# Patient Record
Sex: Female | Born: 1990 | Hispanic: Yes | Marital: Single | Smoking: Never smoker
Health system: Southern US, Community
[De-identification: ages and names within clinical notes are randomized; demographics above are authoritative.]

## PROBLEM LIST (undated history)

## (undated) ENCOUNTER — Ambulatory Visit: Payer: Self-pay

## (undated) ENCOUNTER — Inpatient Hospital Stay (HOSPITAL_COMMUNITY): Payer: Self-pay

## (undated) DIAGNOSIS — O24419 Gestational diabetes mellitus in pregnancy, unspecified control: Secondary | ICD-10-CM

## (undated) DIAGNOSIS — Z8619 Personal history of other infectious and parasitic diseases: Secondary | ICD-10-CM

## (undated) HISTORY — PX: NO PAST SURGERIES: SHX2092

## (undated) HISTORY — PX: APPENDECTOMY: SHX54

## (undated) HISTORY — PX: HERNIA REPAIR: SHX51

---

## 2014-11-22 NOTE — L&D Delivery Note (Signed)
Delivery Note  Patient was admitted after PROM at 0630 on 09/28/15 in early labor. Labor was augmented with pitocin, and patient progressed to completion with a lip at 1850. Labor course significant for prolonged second stage. Patient felt the urge to push before 2000. However, she was still between a -2 and -1 station. She received an epidural around 2200 and labored down before pushing again.   At 1:01 AM a viable and healthy female was delivered via Vaginal, Spontaneous Delivery (Presentation: Right Occiput Anterior) with compound hand.  APGAR: 9, 9; weight pending.   Placenta status: Intact, Spontaneous.  Cord:  with the following complications: arm cord.  Cord pH: N/A  Anesthesia: Epidural  Episiotomy: None Lacerations: 1st degree, perineal Suture Repair: 2.0 vicryl Est. Blood Loss (mL): 400  Mom to postpartum.  Baby to Couplet care / Skin to Skin.  Jamelle HaringHillary M Fitzgerald, MD [facility], PGY-1 09/29/2015, 1:35 AM

## 2015-03-12 ENCOUNTER — Ambulatory Visit (INDEPENDENT_AMBULATORY_CARE_PROVIDER_SITE_OTHER): Payer: Self-pay | Admitting: Obstetrics and Gynecology

## 2015-03-12 ENCOUNTER — Encounter: Payer: Self-pay | Admitting: Obstetrics and Gynecology

## 2015-03-12 VITALS — BP 99/64 | HR 89 | Ht 61.75 in | Wt 116.8 lb

## 2015-03-12 DIAGNOSIS — Z124 Encounter for screening for malignant neoplasm of cervix: Secondary | ICD-10-CM

## 2015-03-12 DIAGNOSIS — O0990 Supervision of high risk pregnancy, unspecified, unspecified trimester: Secondary | ICD-10-CM | POA: Insufficient documentation

## 2015-03-12 DIAGNOSIS — Z789 Other specified health status: Secondary | ICD-10-CM

## 2015-03-12 DIAGNOSIS — O209 Hemorrhage in early pregnancy, unspecified: Secondary | ICD-10-CM

## 2015-03-12 DIAGNOSIS — Z3401 Encounter for supervision of normal first pregnancy, first trimester: Secondary | ICD-10-CM

## 2015-03-12 DIAGNOSIS — Z113 Encounter for screening for infections with a predominantly sexual mode of transmission: Secondary | ICD-10-CM

## 2015-03-12 DIAGNOSIS — Z1151 Encounter for screening for human papillomavirus (HPV): Secondary | ICD-10-CM

## 2015-03-12 DIAGNOSIS — Z3491 Encounter for supervision of normal pregnancy, unspecified, first trimester: Secondary | ICD-10-CM

## 2015-03-12 NOTE — Progress Notes (Signed)
   Subjective:    Alexandra Hardy is a G1P0 [id number] being seen today for her first obstetrical visit.  Her obstetrical history is significant for G1. Patient does intend to breast feed. Pregnancy history fully reviewed.  Patient reports nausea and brown vaginal discharge x 1 wk. Denies antecedent intercourse or irritative vaginitis. Has lower abdominal cramping intermittently and urinary fequency. Denies dysuria, urgency.  Ceasar Mons.  Filed Vitals:   03/12/15 1325 03/12/15 1327  BP: 99/64   Pulse: 89   Height:  5' 1.75" (1.568 m)  Weight: 116 lb 12.8 oz (52.98 kg)     HISTORY: OB History  Gravida Para Term Preterm AB SAB TAB Ectopic Multiple Living  1             # Outcome Date GA Lbr Len/2nd Weight Sex Delivery Anes PTL Lv  1 Current              History reviewed. No pertinent past medical history. History reviewed. No pertinent past surgical history. History reviewed. No pertinent family history.   Exam    Uterus:   Office US >viable c/w 10 wk  Pelvic Exam:    Perineum: Normal Perineum, some scarring of intertriginous, buttock areas where lesions were "burned"   Vulva: normal, Bartholin's, Urethra, Skene's normal   Vagina:  normal mucosa, brown discharge       Cervix: no bleeding following Pap, no cervical motion tenderness, no lesions and nulliparous appearance   Adnexa: not evaluated   Bony Pelvis: average  System: Breast:  normal appearance, no masses or tenderness   Skin: normal coloration and turgor, no rashes    Neurologic: oriented, normal, grossly non-focal   Extremities: normal strength, tone, and muscle mass   HEENT PERRLA, extra ocular movement intact, neck supple with midline trachea and thyroid without masses   Mouth/Teeth mucous membranes moist, pharynx normal without lesions and dental hygiene good   Neck supple and no masses   Cardiovascular: regular rate and rhythm, no murmurs or gallops   Respiratory:  appears well, vitals normal, no respiratory  distress, acyanotic, normal RR, ear and throat exam is normal, neck free of mass or lymphadenopathy, chest clear, no wheezing, crepitations, rhonchi, normal symmetric air entry   Abdomen: soft, non-tender; bowel sounds normal; no masses,  no organomegaly   Urinary: urethral meatus normal      Assessment:    Pregnancy: G1P0 Patient Active Problem List   Diagnosis Date Noted  . Pregnancy, supervision, normal, first 03/12/2015  . Bleeding in early pregnancy 03/12/2015  . Language barrier affecting health care 03/12/2015        Plan:    Pregnancy and bleeding precautions. No intercourse until next visit. Increase fluids.  Wet prep done Initial labs drawn. Prenatal vitamins. Problem list reviewed and updated. Genetic Screening discussed Quad Screen: requested. ([facility] [facility] pt - declines tests)  Ultrasound discussed; fetal survey: requested.  Follow up in 4 weeks. 50% of 30 min visit spent on counseling and coordination of care.  Interpreter here for visit.    POE,DEIRDRE 03/12/2015

## 2015-03-12 NOTE — Patient Instructions (Signed)
Vaginal Bleeding During Pregnancy, First Trimester  A small amount of bleeding (spotting) from the vagina is relatively common in early pregnancy. It usually stops on its own. Various things may cause bleeding or spotting in early pregnancy. Some bleeding may be related to the pregnancy, and some may not. In most cases, the bleeding is normal and is not a problem. However, bleeding can also be a sign of something serious. Be sure to tell your health care provider about any vaginal bleeding right away.  Some possible causes of vaginal bleeding during the first trimester include:  · Infection or inflammation of the cervix.  · Growths (polyps) on the cervix.  · Miscarriage or threatened miscarriage.  · Pregnancy tissue has developed outside of the uterus and in a fallopian tube (tubal pregnancy).  · Tiny cysts have developed in the uterus instead of pregnancy tissue (molar pregnancy).  HOME CARE INSTRUCTIONS   Watch your condition for any changes. The following actions may help to lessen any discomfort you are feeling:  · Follow your health care provider's instructions for limiting your activity. If your health care provider orders bed rest, you may need to stay in bed and only get up to use the bathroom. However, your health care provider may allow you to continue light activity.  · If needed, make plans for someone to help with your regular activities and responsibilities while you are on bed rest.  · Keep track of the number of pads you use each day, how often you change pads, and how soaked (saturated) they are. Write this down.  · Do not use tampons. Do not douche.  · Do not have sexual intercourse or orgasms until approved by your health care provider.  · If you pass any tissue from your vagina, save the tissue so you can show it to your health care provider.  · Only take over-the-counter or prescription medicines as directed by your health care provider.  · Do not take aspirin because it can make you  bleed.  · Keep all follow-up appointments as directed by your health care provider.  SEEK MEDICAL CARE IF:  · You have any vaginal bleeding during any part of your pregnancy.  · You have cramps or labor pains.  · You have a fever, not controlled by medicine.  SEEK IMMEDIATE MEDICAL CARE IF:   · You have severe cramps in your back or belly (abdomen).  · You pass large clots or tissue from your vagina.  · Your bleeding increases.  · You feel light-headed or weak, or you have fainting episodes.  · You have chills.  · You are leaking fluid or have a gush of fluid from your vagina.  · You pass out while having a bowel movement.  MAKE SURE YOU:  · Understand these instructions.  · Will watch your condition.  · Will get help right away if you are not doing well or get worse.  Document Released: 08/18/2005 Document Revised: 11/13/2013 Document Reviewed: 07/16/2013  ExitCare® Patient Information ©2015 [employer]. This information is not intended to replace advice given to you by your health care provider. Make sure you discuss any questions you have with your health care provider.

## 2015-03-12 NOTE — Progress Notes (Signed)
Patient is having an increase in brown discharge.

## 2015-03-13 LAB — PRENATAL PROFILE (SOLSTAS)
Antibody Screen: NEGATIVE
Basophils Absolute: 0 10*3/uL (ref 0.0–0.1)
Basophils Relative: 0 % (ref 0–1)
Eosinophils Absolute: 0.2 10*3/uL (ref 0.0–0.7)
Eosinophils Relative: 3 % (ref 0–5)
HEMATOCRIT: 37.2 % (ref 36.0–46.0)
HEMOGLOBIN: 12.4 g/dL (ref 12.0–15.0)
HEP B S AG: NEGATIVE
HIV 1&2 Ab, 4th Generation: NONREACTIVE
LYMPHS ABS: 1.2 10*3/uL (ref 0.7–4.0)
Lymphocytes Relative: 19 % (ref 12–46)
MCH: 31.7 pg (ref 26.0–34.0)
MCHC: 33.3 g/dL (ref 30.0–36.0)
MCV: 95.1 fL (ref 78.0–100.0)
MONOS PCT: 5 % (ref 3–12)
MPV: 13.9 fL — ABNORMAL HIGH (ref 8.6–12.4)
Monocytes Absolute: 0.3 10*3/uL (ref 0.1–1.0)
NEUTROS ABS: 4.7 10*3/uL (ref 1.7–7.7)
NEUTROS PCT: 73 % (ref 43–77)
Platelets: 266 10*3/uL (ref 150–400)
RBC: 3.91 MIL/uL (ref 3.87–5.11)
RDW: 13.3 % (ref 11.5–15.5)
RUBELLA: 4.45 {index} — AB (ref <0.90)
Rh Type: POSITIVE
WBC: 6.5 10*3/uL (ref 4.0–10.5)

## 2015-03-13 LAB — WET PREP, GENITAL
CLUE CELLS WET PREP: NONE SEEN
Trich, Wet Prep: NONE SEEN
Yeast Wet Prep HPF POC: NONE SEEN

## 2015-03-13 LAB — CYTOLOGY - PAP

## 2015-03-14 LAB — CULTURE, OB URINE
Colony Count: NO GROWTH
ORGANISM ID, BACTERIA: NO GROWTH

## 2015-04-09 ENCOUNTER — Ambulatory Visit (INDEPENDENT_AMBULATORY_CARE_PROVIDER_SITE_OTHER): Payer: Self-pay | Admitting: Certified Nurse Midwife

## 2015-04-09 VITALS — BP 102/67 | HR 89 | Wt 122.0 lb

## 2015-04-09 DIAGNOSIS — Z3492 Encounter for supervision of normal pregnancy, unspecified, second trimester: Secondary | ICD-10-CM

## 2015-04-09 DIAGNOSIS — Z3402 Encounter for supervision of normal first pregnancy, second trimester: Secondary | ICD-10-CM

## 2015-04-09 NOTE — Progress Notes (Signed)
Pt has had no more bleeding. Taken off pelvic rest. Bleeding precautions given.

## 2015-04-09 NOTE — Patient Instructions (Signed)
Vaginal Bleeding During Pregnancy, Second Trimester °A small amount of bleeding (spotting) from the vagina is relatively common in pregnancy. It usually stops on its own. Various things can cause bleeding or spotting in pregnancy. Some bleeding may be related to the pregnancy, and some may not. Sometimes the bleeding is normal and is not a problem. However, bleeding can also be a sign of something serious. Be sure to tell your health care provider about any vaginal bleeding right away. °Some possible causes of vaginal bleeding during the second trimester include: °· Infection, inflammation, or growths on the cervix.   °· The placenta may be partially or completely covering the opening of the cervix inside the uterus (placenta previa). °· The placenta may have separated from the uterus (abruption of the placenta).   °· You may be having early (preterm) labor.   °· The cervix may not be strong enough to keep a baby inside the uterus (cervical insufficiency).   °· Tiny cysts may have developed in the uterus instead of pregnancy tissue (molar pregnancy).  °HOME CARE INSTRUCTIONS  °Watch your condition for any changes. The following actions may help to lessen any discomfort you are feeling: °· Follow your health care provider's instructions for limiting your activity. If your health care provider orders bed rest, you may need to stay in bed and only get up to use the bathroom. However, your health care provider may allow you to continue light activity. °· If needed, make plans for someone to help with your regular activities and responsibilities while you are on bed rest. °· Keep track of the number of pads you use each day, how often you change pads, and how soaked (saturated) they are. Write this down. °· Do not use tampons. Do not douche. °· Do not have sexual intercourse or orgasms until approved by your health care provider. °· If you pass any tissue from your vagina, save the tissue so you can show it to your  health care provider. °· Only take over-the-counter or prescription medicines as directed by your health care provider. °· Do not take aspirin because it can make you bleed. °· Do not exercise or perform any strenuous activities or heavy lifting without your health care provider's permission. °· Keep all follow-up appointments as directed by your health care provider. °SEEK MEDICAL CARE IF: °· You have any vaginal bleeding during any part of your pregnancy. °· You have cramps or labor pains. °· You have a fever, not controlled by medicine. °SEEK IMMEDIATE MEDICAL CARE IF:  °· You have severe cramps in your back or belly (abdomen). °· You have contractions. °· You have chills. °· You pass large clots or tissue from your vagina. °· Your bleeding increases. °· You feel light-headed or weak, or you have fainting episodes. °· You are leaking fluid or have a gush of fluid from your vagina. °MAKE SURE YOU: °· Understand these instructions. °· Will watch your condition. °· Will get help right away if you are not doing well or get worse. °Document Released: 08/18/2005 Document Revised: 11/13/2013 Document Reviewed: 07/16/2013 °ExitCare® Patient Information ©2015 [employer]. This information is not intended to replace advice given to you by your health care provider. Make sure you discuss any questions you have with your health care provider. ° °

## 2015-04-22 ENCOUNTER — Encounter: Payer: Self-pay | Admitting: Physician Assistant

## 2015-04-22 ENCOUNTER — Ambulatory Visit (INDEPENDENT_AMBULATORY_CARE_PROVIDER_SITE_OTHER): Payer: Self-pay | Admitting: Physician Assistant

## 2015-04-22 VITALS — BP 91/54 | HR 76 | Wt 122.0 lb

## 2015-04-22 DIAGNOSIS — Z3402 Encounter for supervision of normal first pregnancy, second trimester: Secondary | ICD-10-CM

## 2015-04-22 NOTE — Patient Instructions (Signed)
Segundo trimestre de embarazo (Second Trimester of Pregnancy) El segundo trimestre va desde la semana13 hasta la 28, desde el cuarto hasta el sexto mes, y suele ser el momento en el que mejor se siente. Su organismo se ha adaptado a estar embarazada y comienza a sentirse fsicamente mejor. En general, las nuseas matutinas han disminuido o han desaparecido completamente, p El segundo trimestre es tambin la poca en la que el feto se desarrolla rpidamente. Hacia el final del sexto mes, el feto mide aproximadamente 9pulgadas (23cm) y pesa alrededor de 1 libras (700g). Es probable que sienta que el beb se mueve (da pataditas) entre las 18 y 20semanas del embarazo. CAMBIOS EN EL ORGANISMO Su organismo atraviesa por muchos cambios durante el embarazo, y estos varan de una mujer a otra.   Seguir aumentando de peso. Notar que la parte baja del abdomen sobresale.  Podrn aparecer las primeras estras en las caderas, el abdomen y las mamas.  Es posible que tenga dolores de cabeza que pueden aliviarse con los medicamentos que su mdico autorice.  Tal vez tenga necesidad de orinar con ms frecuencia porque el feto est ejerciendo presin sobre la vejiga.  Debido al embarazo podr sentir acidez estomacal con frecuencia.  Puede estar estreida, ya que ciertas hormonas enlentecen los movimientos de los msculos que empujan los desechos a travs de los intestinos.  Pueden aparecer hemorroides o abultarse e hincharse las venas (venas varicosas).  Puede tener dolor de espalda que se debe al aumento de peso y a que las hormonas del embarazo relajan las articulaciones entre los huesos de la pelvis, y como consecuencia de la modificacin del peso y los msculos que mantienen el equilibrio.  Las mamas seguirn creciendo y le dolern.  Las encas pueden sangrar y estar sensibles al cepillado y al hilo dental.  Pueden aparecer zonas oscuras o manchas (cloasma, mscara del embarazo) en el rostro que  probablemente se atenuarn despus del nacimiento del beb.  Es posible que se forme una lnea oscura desde el ombligo hasta la zona del pubis (linea nigra) que probablemente se atenuarn despus del nacimiento del beb.  Tal vez haya cambios en el cabello que pueden incluir su engrosamiento, crecimiento rpido y cambios en la textura. Adems, a algunas mujeres se les cae el cabello durante o despus del embarazo, o tienen el cabello seco o fino. Lo ms probable es que el cabello se le normalice despus del nacimiento del beb. QU DEBE ESPERAR EN LAS CONSULTAS PRENATALES Durante una visita prenatal de rutina:  La pesarn para asegurarse de que usted y el feto estn creciendo normalmente.  Le tomarn la presin arterial.  Le medirn el abdomen para controlar el desarrollo del beb.  Se escucharn los latidos cardacos fetales.  Se evaluarn los resultados de los estudios solicitados en visitas anteriores. El mdico puede preguntarle lo siguiente:  Cmo se siente.  Si siente los movimientos del beb.  Si ha tenido sntomas anormales, como prdida de lquido, sangrado, dolores de cabeza intensos o clicos abdominales.  Si tiene alguna pregunta. Otros estudios que podrn realizarse durante el segundo trimestre incluyen lo siguiente:  Anlisis de sangre para detectar:  Concentraciones de hierro bajas (anemia).  Diabetes gestacional (entre la semana 24 y la 28).  Anticuerpos Rh.  Anlisis de orina para detectar infecciones, diabetes o protenas en la orina.  Una ecografa para confirmar que el beb crece y se desarrolla correctamente.  Una amniocentesis para diagnosticar posibles problemas genticos.  Estudios del feto para descartar espina   bfida y sndrome de Down. INSTRUCCIONES PARA EL CUIDADO EN EL HOGAR   Evite fumar, consumir hierbas, beber alcohol y tomar frmacos que no le hayan recetado. Estas sustancias qumicas afectan la formacin y el desarrollo del beb.  Siga  las indicaciones del mdico en relacin con el uso de medicamentos. Durante el embarazo, hay medicamentos que son seguros de tomar y otros que no.  Haga actividad fsica solo en la forma indicada por el mdico. Sentir clicos uterinos es un buen signo para detener la actividad fsica.  Contine comiendo alimentos que sanos con regularidad.  Use un sostn que le brinde buen soporte si le duelen las mamas.  No se d baos de inmersin en agua caliente, baos turcos ni saunas.  Colquese el cinturn de seguridad cuando conduzca.  No coma carne cruda ni queso sin cocinar; evite el contacto con las bandejas sanitarias de los gatos y la tierra que estos animales usan. Estos elementos contienen grmenes que pueden causar defectos congnitos en el beb.  Tome las vitaminas prenatales.  Si est estreida, pruebe un laxante suave (si el mdico lo autoriza). Consuma ms alimentos ricos en fibra, como vegetales y frutas frescos y cereales integrales. Beba gran cantidad de lquido para mantener la orina de tono claro o color amarillo plido.  Dese baos de asiento con agua tibia para aliviar el dolor o las molestias causadas por las hemorroides. Use una crema para las hemorroides si el mdico la autoriza.  Si tiene venas varicosas, use medias de descanso. Eleve los pies durante 15minutos, 3 o 4veces por da. Limite la cantidad de sal en su dieta.  No levante objetos pesados, use zapatos de tacones bajos y mantenga una buena postura.  Descanse con las piernas elevadas si tiene calambres o dolor de cintura.  Visite a su dentista si an no lo ha hecho durante el embarazo. Use un cepillo de dientes blando para higienizarse los dientes y psese el hilo dental con suavidad.  Puede seguir manteniendo relaciones sexuales, a menos que el mdico le indique lo contrario.  Concurra a todas las visitas prenatales segn las indicaciones de su mdico. SOLICITE ATENCIN MDICA SI:   Tiene mareos.  Siente  clicos leves, presin en la pelvis o dolor persistente en el abdomen.  Tiene nuseas, vmitos o diarrea persistentes.  Tiene secrecin vaginal con mal olor.  Siente dolor al orinar. SOLICITE ATENCIN MDICA DE INMEDIATO SI:   Tiene fiebre.  Tiene una prdida de lquido por la vagina.  Tiene sangrado o pequeas prdidas vaginales.  Siente dolor intenso o clicos en el abdomen.  Sube o baja de peso rpidamente.  Tiene dificultad para respirar y siente dolor de pecho.  Sbitamente se le hinchan mucho el rostro, las manos, los tobillos, los pies o las piernas.  No ha sentido los movimientos del beb durante una hora.  Siente un dolor de cabeza intenso que no se alivia con medicamentos.  Hay cambios en la visin. Document Released: 08/18/2005 Document Revised: 11/13/2013 [employer] Patient Information 2015 [employer]. This information is not intended to replace advice given to you by your health care provider. Make sure you discuss any questions you have with your health care provider.  

## 2015-04-22 NOTE — Progress Notes (Signed)
16 weeks with lower abdominal pain.  Pain is worse on left but noticeable right side also.  It started yesterday after mopping.  Its gone at rest but noticed again with walking or activity.  Denies LOF, VB, Dysuria.  She notes she does feel fetal movement.  No drinking well.  Mild tenderness noted on exam of lower abdomen bilaterally.  In distribution consistent with round ligament pain.   Willl order anatomy scan.  OB urine culture pending Pt to increase fluids and use maternity support band.  RTC 4 weeks - or prn if no improvement in pain.

## 2015-05-07 ENCOUNTER — Encounter: Payer: Self-pay | Admitting: Physician Assistant

## 2015-05-13 ENCOUNTER — Ambulatory Visit (HOSPITAL_COMMUNITY)
Admission: RE | Admit: 2015-05-13 | Discharge: 2015-05-13 | Disposition: A | Discharge status: Home / Self Care | Payer: Self-pay | Source: Ambulatory Visit | Attending: Physician Assistant | Admitting: Physician Assistant

## 2015-05-13 ENCOUNTER — Ambulatory Visit (HOSPITAL_COMMUNITY): Payer: Self-pay

## 2015-05-13 DIAGNOSIS — Z3A19 19 weeks gestation of pregnancy: Secondary | ICD-10-CM | POA: Insufficient documentation

## 2015-05-13 DIAGNOSIS — Z3402 Encounter for supervision of normal first pregnancy, second trimester: Secondary | ICD-10-CM

## 2015-05-13 DIAGNOSIS — Z36 Encounter for antenatal screening of mother: Secondary | ICD-10-CM | POA: Insufficient documentation

## 2015-05-13 DIAGNOSIS — Z3689 Encounter for other specified antenatal screening: Secondary | ICD-10-CM | POA: Insufficient documentation

## 2015-05-14 ENCOUNTER — Ambulatory Visit (INDEPENDENT_AMBULATORY_CARE_PROVIDER_SITE_OTHER): Payer: Self-pay | Admitting: Advanced Practice Midwife

## 2015-05-14 VITALS — BP 101/64 | HR 83 | Wt 128.0 lb

## 2015-05-14 DIAGNOSIS — R87810 Cervical high risk human papillomavirus (HPV) DNA test positive: Secondary | ICD-10-CM

## 2015-05-14 DIAGNOSIS — Z3402 Encounter for supervision of normal first pregnancy, second trimester: Secondary | ICD-10-CM

## 2015-05-14 DIAGNOSIS — O26892 Other specified pregnancy related conditions, second trimester: Secondary | ICD-10-CM

## 2015-05-14 DIAGNOSIS — N898 Other specified noninflammatory disorders of vagina: Secondary | ICD-10-CM

## 2015-05-14 NOTE — Patient Instructions (Signed)
Lactancia materna (Breastfeeding) Decidir Museum/gallery exhibitions officer es una de las mejores elecciones que puede hacer por usted y su beb. El cambio hormonal durante el Psychiatrist produce el desarrollo del tejido mamario y [place] la cantidad y el tamao de los conductos galactforos. Estas hormonas tambin permiten que las protenas, los azcares y las grasas de la sangre produzcan la [employer] materna en las glndulas productoras de [place]. Las hormonas impiden que la leche materna sea liberada antes del nacimiento del beb, adems de impulsar el flujo de leche luego del nacimiento. Una vez que ha comenzado a Museum/gallery exhibitions officer, Conservation officer, nature beb, as Immunologist succin o Theatre manager, pueden estimular la liberacin de [place] de las glndulas productoras de [place].  LOS BENEFICIOS DE AMAMANTAR Para el beb  La primera leche (calostro) ayuda a Careers information officer funcionamiento del sistema digestivo del beb.  La leche tiene anticuerpos que ayudan a Radio producer las infecciones en el beb.  El beb tiene una menor incidencia de asma, alergias y del sndrome de muerte sbita del lactante.  Los nutrientes en la [place] materna son mejores para el beb que la [place] maternizada y estn preparados exclusivamente para cubrir las necesidades del beb.  La leche materna mejora el desarrollo cerebral del beb.  Es menos probable que el beb desarrolle otras enfermedades, como obesidad infantil, asma o diabetes mellitus de tipo 2. Para usted   La lactancia materna favorece el desarrollo de un vnculo muy especial entre la madre y el beb.  Es conveniente. La leche materna siempre est disponible a la Human resources officer y es [place].  La lactancia materna ayuda a quemar caloras y a perder el peso ganado durante el [place].  Favorece la contraccin del tero al tamao que tena antes del embarazo de manera ms rpida y disminuye el sangrado (loquios) despus del parto.  La lactancia materna contribuye a reducir Nurse, adult de desarrollar diabetes  mellitus de tipo 2, osteoporosis o cncer de mama o de ovario en el futuro. SIGNOS DE QUE EL BEB EST HAMBRIENTO Primeros signos de [address] de [place].  Se estira.  Mueve la cabeza de un lado a otro.  Mueve la cabeza y abre la boca cuando se le toca la mejilla o la comisura de la boca (reflejo de bsqueda).  Aumenta las vocalizaciones, tales como sonidos de succin, se relame los labios, emite arrullos, suspiros, o chirridos.  Mueve la [employer] boca.  Se chupa con ganas los dedos o las manos. Signos tardos de [employer].  Llora de manera intermitente. Signos de [employer] signos de hambre extrema requerirn que lo calme y lo consuele antes de que el beb pueda alimentarse adecuadamente. No espere a que se manifiesten los siguientes signos de hambre extrema para comenzar a Museum/gallery exhibitions officer:   Designer, jewellery.  Llanto intenso y fuerte.   Gritos. INFORMACIN BSICA SOBRE LA LACTANCIA MATERNA Iniciacin de la lactancia materna  Encuentre un lugar cmodo para sentarse o acostarse, con un buen respaldo para el cuello y la espalda.  Coloque una almohada o una manta enrollada debajo del beb para acomodarlo a la altura de la mama (si est sentada). Las almohadas para Museum/gallery exhibitions officer se han diseado especialmente a fin de servir de apoyo para los brazos y el beb [employer].  Asegrese de que el abdomen del beb est frente al suyo.  Masajee suavemente la mama. Con las yemas de los dedos, masajee la pared del pecho hacia el pezn en un movimiento circular.  Esto estimula el flujo de leche. Es posible que deba continuar este movimiento mientras amamanta si la leche fluye lentamente.  Sostenga la mama con el pulgar por arriba del pezn y los otros 4 dedos por debajo de la mama. Asegrese de que los dedos se encuentren lejos del pezn y de la boca del beb.  Empuje suavemente los labios del beb con el pezn o con el dedo.  Cuando la boca del  beb se abra lo suficiente, acrquelo rpidamente a la mama e introduzca todo el pezn y la zona oscura que lo rodea (areola), tanto como sea posible, dentro de la boca del beb.  Debe haber ms areola visible por arriba del labio superior del beb que por debajo del labio inferior.  La lengua del beb debe estar entre la enca inferior y la mama.  Asegrese de que la boca del beb est en la posicin correcta alrededor del pezn (prendida). Los labios del beb deben crear un sello sobre la mama y estar doblados hacia afuera (invertidos).  Es comn que el beb succione durante 2 a 3 minutos para que comience el flujo de leche materna. Cmo debe prenderse Es muy importante que le ensee al beb cmo prenderse adecuadamente a la mama. Si el beb no se prende adecuadamente, puede causarle dolor en el pezn y reducir la produccin de leche materna, y hacer que el beb tenga un escaso aumento de peso. Adems, si el beb no se prende adecuadamente al pezn, puede tragar aire durante la alimentacin. Esto puede causarle molestias al beb. Hacer eructar al beb al cambiar de mama puede ayudarlo a liberar el aire. Sin embargo, ensearle al beb cmo prenderse a la mama adecuadamente es la mejor manera de evitar que se sienta molesto por tragar aire mientras se alimenta. Signos de que el beb se ha prendido adecuadamente al pezn:   Tironea o succiona de modo silencioso, sin causarle dolor.  Se escucha que traga cada 3 o 4 succiones.   Hay movimientos musculares por arriba y por delante de sus odos al succionar. Signos de que el beb no se ha prendido adecuadamente al pezn:   Hace ruidos de succin o de chasquido mientras se alimenta.  Siente dolor en el pezn. Si cree que el beb no se prendi correctamente, deslice el dedo en la comisura de la boca y colquelo entre las encas del beb para interrumpir la succin. Intente comenzar a amamantar nuevamente. Signos de lactancia materna exitosa Signos  del beb:   Disminuye gradualmente el nmero de succiones o cesa la succin por completo.  Se duerme.  Relaja el cuerpo.  Retiene una pequea cantidad de leche en la boca.  Se desprende solo del pecho. Signos que presenta usted:  Las mamas han aumentado la firmeza, el peso y el tamao 1 a 3 horas despus de amamantar.  Estn ms blandas inmediatamente despus de amamantar.  Un aumento del volumen de leche, y tambin un cambio en su consistencia y color se producen hacia el quinto da de lactancia materna.  Los pezones no duelen, ni estn agrietados ni sangran. Signos de que su beb recibe la cantidad de leche suficiente  Moja al menos 3 paales en 24 horas. La orina debe ser clara y de color amarillo plido a los 5 das de vida.  Defeca al menos 3 veces en 24 horas a los 5 das de vida. La materia fecal debe ser blanda y amarillenta.  Defeca al menos 3 veces en 24 horas a   los 7 das de vida. La materia fecal debe ser grumosa y amarillenta.  No registra una prdida de peso mayor del 10% del peso al nacer durante los primeros 3 das de vida.  Aumenta de peso un promedio de 4 a 7onzas (113 a 198g) por semana despus de los 4 das de vida.  Aumenta de peso, diariamente, de manera uniforme a partir de los 5 das de vida, sin registrar prdida de peso despus de las 2semanas de vida. Despus de alimentarse, es posible que el beb regurgite una pequea cantidad. Esto es frecuente. FRECUENCIA Y DURACIN DE LA LACTANCIA MATERNA El amamantamiento frecuente la ayudar a producir ms leche y a prevenir problemas de dolor en los pezones e hinchazn en las mamas. Alimente al beb cuando muestre signos de hambre o si siente la necesidad de reducir la congestin de las mamas. Esto se denomina "lactancia a demanda". Evite el uso del chupete mientras trabaja para establecer la lactancia (las primeras 4 a 6 semanas despus del nacimiento del beb). Despus de este perodo, podr ofrecerle un  chupete. Las investigaciones demostraron que el uso del chupete durante el primer ao de vida del beb disminuye el riesgo de desarrollar el sndrome de muerte sbita del lactante (SMSL). Permita que el nio se alimente en cada mama todo lo que desee. Contine amamantando al beb hasta que haya terminado de alimentarse. Cuando el beb se desprende o se queda dormido mientras se est alimentando de la primera mama, ofrzcale la segunda. Debido a que, con frecuencia, los recin nacidos permanecen somnolientos las primeras semanas de vida, es posible que deba despertar al beb para alimentarlo. Los horarios de lactancia varan de un beb a otro. Sin embargo, las siguientes reglas pueden servir como gua para ayudarla a garantizar que el beb se alimenta adecuadamente:  Se puede amamantar a los recin nacidos (bebs de 4 semanas o menos de vida) cada 1 a 3 horas.  No deben transcurrir ms de 3 horas durante el da o 5 horas durante la noche sin que se amamante a los recin nacidos.  Debe amamantar al beb 8 veces como mnimo en un perodo de 24 horas, hasta que comience a introducir slidos en su dieta, a los 6 meses de vida aproximadamente. EXTRACCIN DE LECHE MATERNA La extraccin y el almacenamiento de la leche materna le permiten asegurarse de que el beb se alimente exclusivamente de leche materna, aun en momentos en los que no puede amamantar. Esto tiene especial importancia si debe regresar al trabajo en el perodo en que an est amamantando o si no puede estar presente en los momentos en que el beb debe alimentarse. Su asesor en lactancia puede orientarla sobre cunto tiempo es seguro almacenar leche materna.  El sacaleche es un aparato que le permite extraer leche de la mama a un recipiente estril. Luego, la leche materna extrada puede almacenarse en un refrigerador o congelador. Algunos sacaleches son manuales, mientras que otros son elctricos. Consulte a su asesor en lactancia qu tipo ser  ms conveniente para usted. Los sacaleches se pueden comprar; sin embargo, algunos hospitales y grupos de apoyo a la lactancia materna alquilan sacaleches mensualmente. Un asesor en lactancia puede ensearle cmo extraer leche materna manualmente, en caso de que prefiera no usar un sacaleche.  CMO CUIDAR LAS MAMAS DURANTE LA LACTANCIA MATERNA Los pezones se secan, agrietan y duelen durante la lactancia materna. Las siguientes recomendaciones pueden ayudarla a mantener las mamas humectadas y sanas:  Evite usar jabn en los pezones.    Use un sostn de soporte. Aunque no son esenciales, las camisetas sin mangas o los sostenes especiales para amamantar estn diseados para acceder fcilmente a las mamas, para amamantar sin tener que quitarse todo el sostn o la camiseta. Evite usar sostenes con aro o sostenes muy ajustados.  Seque al aire sus pezones durante 3 a 4minutos despus de amamantar al beb.  Utilice solo apsitos de algodn en el sostn para absorber las prdidas de leche. La prdida de un poco de leche materna entre las tomas es normal.  Utilice lanolina sobre los pezones luego de amamantar. La lanolina ayuda a mantener la humedad normal de la piel. Si usa lanolina pura, no tiene que lavarse los pezones antes de volver a alimentar al beb. La lanolina pura no es txica para el beb. Adems, puede extraer manualmente algunas gotas de leche materna y masajear suavemente esa leche sobre los pezones, para que la leche se seque al aire. Durante las primeras semanas despus de dar a luz, algunas mujeres pueden experimentar hinchazn en las mamas (congestin mamaria). La congestin puede hacer que sienta las mamas pesadas, calientes y sensibles al tacto. El pico de la congestin ocurre dentro de los 3 a 5 das despus del parto. Las siguientes recomendaciones pueden ayudarla a aliviar la congestin:  Vace por completo las mamas al amamantar o extraer leche. Puede aplicar calor hmedo en las mamas  (en la ducha o con toallas hmedas para manos) antes de amamantar o extraer leche. Esto aumenta la circulacin y ayuda a que la leche fluya. Si el beb no vaca por completo las mamas cuando lo amamanta, extraiga la leche restante despus de que haya finalizado.  Use un sostn ajustado (para amamantar o comn) o una camiseta sin mangas durante 1 o 2 das para indicar al cuerpo que disminuya ligeramente la produccin de leche.  Aplique compresas de hielo sobre las mamas, a menos que le resulte demasiado incmodo.  Asegrese de que el beb est prendido y se encuentre en la posicin correcta mientras lo alimenta. Si la congestin persiste luego de 48 horas o despus de seguir estas recomendaciones, comunquese con su mdico o un asesor en lactancia. RECOMENDACIONES GENERALES PARA EL CUIDADO DE LA SALUD DURANTE LA LACTANCIA MATERNA  Consuma alimentos saludables. Alterne comidas y colaciones, y coma 3 de cada una por da. Dado que lo que come afecta la leche materna, es posible que algunas comidas hagan que su beb se vuelva ms irritable de lo habitual. Evite comer este tipo de alimentos si percibe que afectan de manera negativa al beb.  Beba leche, jugos de fruta y agua para satisfacer su sed (aproximadamente 10 vasos al da).  Descanse con frecuencia, reljese y tome sus vitaminas prenatales para evitar la fatiga, el estrs y la anemia.  Contine con los autocontroles de la mama.  Evite masticar y fumar tabaco.  Evite el consumo de alcohol y drogas. Algunos medicamentos, que pueden ser perjudiciales para el beb, pueden pasar a travs de la leche materna. Es importante que consulte a su mdico antes de tomar cualquier medicamento, incluidos todos los medicamentos recetados y de venta libre, as como los suplementos vitamnicos y herbales. Puede quedar embarazada durante la lactancia. Si desea controlar la natalidad, consulte a su mdico cules son las opciones ms seguras para el  beb. SOLICITE ATENCIN MDICA SI:   Usted siente que quiere dejar de amamantar o se siente frustrada con la lactancia.  Siente dolor en las mamas o en los pezones.  Sus   pezones estn agrietados o sangran.  Sus pechos estn irritados, sensibles o calientes.  Tiene un rea hinchada en cualquiera de las mamas.  Siente escalofros o fiebre.  Tiene nuseas o vmitos.  Presenta una secrecin de otro lquido distinto de la leche materna de los pezones.  Sus mamas no se llenan antes de Museum/gallery exhibitions officer al beb para el quinto da despus del [place].  Se siente triste y deprimida.  El beb est demasiado somnoliento como para comer bien.  El beb tiene problemas para dormir.  Moja menos de 3 paales en 24 horas.  Defeca menos de 3 veces en 24 horas.  La piel del beb o la parte blanca de los ojos se vuelven amarillentas.  El beb no ha aumentado de [place] a los [address] de [place]. SOLICITE ATENCIN MDICA DE INMEDIATO SI:   El beb est muy cansado Retail buyer) y no se quiere despertar para comer.  Le sube la fiebre sin causa. Document Released: 11/08/2005 Document Revised: 11/13/2013 [facility] Patient Information 2015 [employer], [place]. This information is not intended to replace advice given to you by your health care provider. Make sure you discuss any questions you have with your health care provider.   Segundo trimestre de Psychiatrist (Second Trimester of Pregnancy) El segundo trimestre va desde la semana13 hasta la 28, desde el cuarto hasta el sexto mes, y suele ser el momento en el que mejor se siente. Su organismo se ha adaptado a Charity fundraiser y comienza a Diplomatic Services operational officer. En general, las nuseas matutinas han disminuido o han desaparecido completamente, p El segundo trimestre es tambin la poca en la que el feto se desarrolla rpidamente. Hacia el final del sexto mes, el feto mide aproximadamente 9pulgadas (23cm) y pesa alrededor de 1 libras (700g). Es probable que sienta  que el beb se Teacher, English as a foreign language (da pataditas) entre las 18 y 20semanas del Psychiatrist. CAMBIOS EN EL ORGANISMO Su organismo atraviesa por muchos cambios durante el [place], y estos varan de Neomia Dear mujer a Educational psychologist.   Seguir [employer]. Notar que la parte baja del abdomen sobresale.  Podrn aparecer las primeras [employer] caderas, el abdomen y las [place].  Es posible que tenga dolores de cabeza que pueden aliviarse con los medicamentos que su mdico autorice.  Tal vez tenga necesidad de orinar con ms frecuencia porque el feto est ejerciendo presin Ambulance person.  Debido al Vanetta Mulders podr sentir Anthoney Harada estomacal con frecuencia.  Puede estar estreida, ya que ciertas hormonas enlentecen los movimientos de los msculos que [employer] desechos a travs de los intestinos.  Pueden aparecer hemorroides o abultarse e hincharse las venas (venas varicosas).  Puede tener dolor de espalda que se debe al [employer] de peso y a que las hormonas del Management consultant las articulaciones entre los huesos de la pelvis, y Public librarian consecuencia de la modificacin del peso y los msculos que mantienen el equilibrio.  Las [employer] seguirn creciendo y Development worker, community.  Las Veterinary surgeon y estar sensibles al cepillado y al hilo dental.  Pueden aparecer zonas oscuras o manchas (cloasma, mscara del Psychiatrist) en el rostro que probablemente se atenuarn despus del nacimiento del beb.  Es posible que se forme una lnea oscura desde el ombligo hasta la zona del pubis (linea nigra) que probablemente se atenuarn despus del nacimiento del beb.  Tal vez haya cambios en el cabello que pueden incluir su engrosamiento, crecimiento rpido y cambios en la textura. Adems, a algunas mujeres se les cae el cabello durante  o despus del embarazo, o tienen el cabello seco o fino. Lo ms probable es que el cabello se le normalice despus del nacimiento del beb. QU DEBE ESPERAR EN LAS CONSULTAS PRENATALES Durante una visita  prenatal de rutina:  La pesarn para asegurarse de que usted y el feto estn creciendo normalmente.  Le tomarn la presin arterial.  Le medirn el abdomen para controlar el desarrollo del beb.  Se escucharn los latidos cardacos fetales.  Se evaluarn los resultados de los estudios solicitados en visitas anteriores. El mdico puede preguntarle lo siguiente:  Cmo se siente.  Si siente los movimientos del beb.  Si ha tenido sntomas anormales, como prdida de lquido, [place], dolores de cabeza intensos o clicos abdominales.  Si tiene [employer]. Otros estudios que podrn realizarse durante el segundo trimestre incluyen lo siguiente:  Anlisis de sangre para detectar:  Concentraciones de hierro bajas (anemia).  Diabetes gestacional (entre la semana 24 y la 28).  Anticuerpos Rh.  Anlisis de orina para detectar infecciones, diabetes o protenas en la orina.  Una ecografa para confirmar que el beb crece y se desarrolla correctamente.  Una amniocentesis para diagnosticar posibles problemas genticos.  Estudios del feto para descartar espina bfida y sndrome de [place]. INSTRUCCIONES PARA EL CUIDADO EN EL HOGAR   Evite fumar, consumir hierbas, beber alcohol y tomar frmacos que no le hayan recetado. Estas sustancias qumicas afectan la formacin y el desarrollo del beb.  Siga las indicaciones del mdico en relacin con el uso de medicamentos. Durante el embarazo, hay medicamentos que son seguros de tomar y otros que no.  Haga actividad fsica solo en la forma indicada por el mdico. Sentir clicos uterinos es un buen signo para Restaurant manager, fast food actividad fsica.  Contine comiendo alimentos que sanos con regularidad.  Use un sostn que le brinde buen soporte si le [employer].  No se d baos de inmersin en agua caliente, baos turcos ni saunas.  Colquese el cinturn de seguridad cuando conduzca.  No coma carne cruda ni queso sin cocinar; evite el contacto  con las bandejas sanitarias de los gatos y la tierra que estos animales usan. Estos elementos contienen grmenes que pueden causar defectos congnitos en el beb.  Tome las vitaminas prenatales.  Si est estreida, pruebe un laxante suave (si el mdico lo autoriza). Consuma ms alimentos ricos en fibra, como vegetales y frutas frescos y Radiation protection practitioner. Beba gran cantidad de lquido para mantener la orina de tono claro o color amarillo plido.  Dese baos de asiento con agua tibia para Engineer, materials o las molestias causadas por las hemorroides. Use una crema para las hemorroides si el mdico la autoriza.  Si tiene venas varicosas, use medias de descanso. Eleve los pies durante , 3 o 4veces por da. Limite la cantidad de sal en su dieta.  No levante objetos pesados, use zapatos de tacones bajos y [address].  Descanse con las piernas elevadas si tiene calambres o dolor de cintura.  Visite a su dentista si an no lo ha Occupational hygienist. Use un cepillo de dientes blando para higienizarse los dientes y psese el hilo dental con suavidad.  Puede seguir [employer], a menos que el mdico le indique lo contrario.  Concurra a todas las visitas prenatales segn las indicaciones de su mdico. SOLICITE ATENCIN MDICA SI:   [place].  Siente clicos leves, presin en la pelvis o dolor persistente en el abdomen.  Tiene nuseas, vmitos o  diarrea persistentes.  Tiene secrecin vaginal con mal olor.  Siente dolor al [employer]. SOLICITE ATENCIN MDICA DE INMEDIATO SI:   Tiene fiebre.  Tiene una prdida de lquido por la vagina.  Tiene sangrado o pequeas prdidas vaginales.  Siente dolor intenso o clicos en el abdomen.  Sube o baja de peso rpidamente.  Tiene dificultad para respirar y siente dolor de pecho.  Sbitamente se le hinchan mucho el rostro, las [place], los tobillos, los pies o las piernas.  No ha sentido los  movimientos del beb durante Georgianne Fick.  Siente un dolor de cabeza intenso que no se alivia con medicamentos.  Hay cambios en la visin. Document Released: 08/18/2005 Document Revised: 11/13/2013 [facility] Patient Information 2015 [place], [place]. This information is not intended to replace advice given to you by your health care provider. Make sure you discuss any questions you have with your health care provider.

## 2015-05-14 NOTE — Progress Notes (Signed)
Normal anatomy scan. Discussed HRHPV on Pap. Nor mal screening. C/O vaginal irritation and pain w/ IC. Also increased discharge. No VB or LOF. No Abd pain. Wet-Prep.

## 2015-05-15 LAB — WET PREP, GENITAL
Clue Cells Wet Prep HPF POC: NONE SEEN
TRICH WET PREP: NONE SEEN
YEAST WET PREP: NONE SEEN

## 2015-06-11 ENCOUNTER — Ambulatory Visit (INDEPENDENT_AMBULATORY_CARE_PROVIDER_SITE_OTHER): Payer: Self-pay | Admitting: Obstetrics & Gynecology

## 2015-06-11 ENCOUNTER — Encounter: Payer: Self-pay | Admitting: Obstetrics & Gynecology

## 2015-06-11 VITALS — BP 88/58 | HR 76 | Wt 132.0 lb

## 2015-06-11 DIAGNOSIS — Z3402 Encounter for supervision of normal first pregnancy, second trimester: Secondary | ICD-10-CM

## 2015-06-11 NOTE — Patient Instructions (Signed)
Regrese a la clinica cuando tenga su cita. Si tiene problemas o preguntas, llama a la clinica o vaya a la [facility] Eleccin del mtodo anticonceptivo (Contraception Choices) La anticoncepcin (control de la natalidad) es el uso de cualquier mtodo o dispositivo para Location managerevitar el embarazo. A continuacin se indican algunos de esos mtodos. MTODOS HORMONALES   El Implante contraconceptivo consiste en un tubo plstico delgado que contiene la hormona progesterona. No contiene estrgenos. El mdico inserta el tubo en la parte interna del brazo. El tubo puede Geneticist, molecularpermanecer en el lugar durante 3 aos. Despus de los 3 aos debe retirarse. El implante impide que los ovarios liberen vulos (ovulacin), espesa el moco cervical, lo que evita que los espermatozoides ingresen al tero y hace ms delgada la membrana que cubre el interior del tero.  Inyecciones de progesterona sola: las Insurance underwriteradministra el mdico cada 3 meses para Location managerevitar el embarazo. La progesterona sinttica impide que los ovarios liberen vulos. Tambin hacen que el moco cervical se espese y modifique el tejido de recubrimiento interno del tero. Esto hace ms difcil que los espermatozoides sobrevivan en el tero.  Las pldoras anticonceptivas contienen estrgenos y Education officer, museumprogesterona. Su funcin es ALLTEL Corporationevitar que los ovarios liberen vulos (ovulacin). Las hormonas de los anticonceptivos orales hacen que el moco cervical se haga ms espeso, lo que evita que el esperma ingrese al tero. Las pldoras anticonceptivas son recetadas por el mdico.Tambin se utilizan para tratar los perodos menstruales abundantes.  Minipldora: este tipo de pldora anticonceptiva contiene slo hormona progesterona. Deben tomarse todos los [address]  [address] del mes y debe recetarlas el mdico.  El parche de control de natalidad: contiene hormonas similares a las que contienen las pldoras anticonceptivas. Deben cambiarse una vez por semana y se utilizan bajo  prescripcin mdica.  Anillo vaginal: contiene hormonas similares a las que contienen las pldoras anticonceptivas. Se deja colocado durante tres semanas, se lo retira durante 1 semana y luego se coloca uno nuevo. La paciente debe sentirse cmoda al insertar y retirar el anillo de la vagina.Es necesaria la prescripcin mdica.  Anticonceptivos de emergencia: son mtodos para evitar un embarazo despus de Neomia Dearuna relacin sexual sin proteccin. Esta pldora puede tomarse inmediatamente despus de Child psychotherapisttener relaciones sexuales o hasta 5 Indian Fallsdas de haber tenido sexo sin proteccin. Es ms efectiva si se toma poco tiempo despus de la relacin sexual. Los anticonceptivos de emergencia estn disponibles sin prescripcin mdica. Consltelo con su farmacutico. No use los anticonceptivos de emergencia como nico mtodo anticonceptivo. MTODOS DE Lenis NoonBARRERA   Condn masculino: es una vaina delgada (ltex o goma) que se coloca cubriendo al pene durante el acto sexual. Deri Fuellinguede usarse con espermicida para aumentar la efectividad.  Condn femenino. Es una funda delicada y blanda que se adapta holgadamente a la vagina antes de las Clinical research associaterelaciones sexuales.  Diafragma: es una barrera de ltex redonda y suave que debe ser recomendado por un profesional. Se inserta en la vagina, junto con un gel espermicida. Debe insertarse antes de Management consultanttener relaciones sexuales. Debe dejar el diafragma colocado en la vagina durante 6 a 8 horas despus de la relacin sexual.  Capuchn cervical: es una barrera de ltex o taza plstica redonda y Bahamassuave que cubre el cuello del tero y debe ser colocada por un mdico. Puede dejarlo colocado en la vagina hasta 48 horas despus de las Clinical research associaterelaciones sexuales.  Esponja: es una pieza blanda y circular de espuma de poliuretano. Contiene un espermicida. Se inserta en la vagina despus de mojarla y antes  de las relaciones sexuales.  Espermicidas: son sustancias qumicas que matan o bloquean al esperma y no lo dejan  ingresar al cuello del tero y al tero. Vienen en forma de cremas, geles, supositorios, espuma o comprimidos. No es necesario tener receta mdica. Se insertan en la vagina con un aplicador antes de tener relaciones sexuales. El proceso debe repetirse cada vez que tiene relaciones sexuales. ANTICONCEPTIVOS INTRAUTERINOS  Dispositivo intrauterino (DIU) es un dispositivo en forma de T que se coloca en el tero durante el perodo menstrual, para evitar el embarazo. Hay dos tipos:  DIU de cobre: este tipo de DIU est recubierto con un alambre de cobre y se inserta dentro del tero. El cobre hace que el tero y las trompas de Falopio produzcan un liquido que destruye los espermatozoides. Puede permanecer colocado durante 10 aos.  DIU con hormona: este tipo de DIU contiene la hormona progestina (progesterona sinttica). La hormona espesa el moco cervical y evita que los espermatozoides ingresen al tero y tambin afina la membrana que cubre el tero para evitar la implantacin del vulo fertilizado. La hormona debilita o destruye los espermatozoides que ingresan al tero. Puede permanecer en el lugar durante 3-5 aos, segn el tipo de DIU que se utilice. MTODOS ANTICONCEPTIVOS PERMANENTES  Ligadura de trompas en la mujer: se realiza sellando, atando u obstruyendo quirrgicamente las trompas de Falopio lo que impide que el vulo descienda hacia el tero.  Esterilizacin histeroscpica: Implica la colocacin de un pequeo espiral o la insercin en cada trompa de Falopio. El mdico utiliza una tcnica llamada histeroscopa para realizar este procedimiento. El dispositivo produce la formacin de tejido cicatrizal. Esto da como resultado una obstruccin permanente de las trompas de Falopio, de modo que la esperma no pueda fertilizar el vulo. Demora alrededor de 3 meses despus del procedimiento hasta que el conducto se obstruye. Tendr que usar otro mtodo anticonceptivo durante al menos 3  meses.  Esterilizacin masculina: se realiza ligando los conductos por los que pasan los espermatozoides (vasectoma).Esto impide que el esperma ingrese a la vagina durante el acto sexual. Luego del procedimiento, el hombre puede eyacular lquido (semen). MTODOS DE PLANIFICACIN NATURAL  Planificacin familiar natural: consiste en no tener relaciones sexuales o usar un mtodo de barrera (condn, diafragma, capuchn cervical) en los das que la mujer podra quedar embarazada.  Mtodo de calendario: consiste en el seguimiento de la duracin de cada ciclo menstrual y la identificacin de los perodos frtiles.  Mtodo de ovulacin: consiste en evitar las relaciones sexuales durante la ovulacin.  Mtodo sintotrmico: consiste en evitar las relaciones sexuales en la poca en la que se est ovulando, utilizando un termmetro y tendiendo en cuenta los sntomas de la ovulacin.  Mtodo postovulacin: consiste en planificar las relaciones sexuales para despus de haber ovulado. Independientemente del tipo o mtodo anticonceptivo que usted elija, es importante que use condones para protegerse contra las infecciones de transmisin sexual (ETS). Hable con su mdico con respecto a qu mtodo anticonceptivo es el ms apropiado para usted. Document Released: 11/08/2005 Document Revised: 07/11/2013 Exit[employer] Patient Information 2015 [employer]. This information is not intended to replace advice given to you by your health care provider. Make sure you discuss any questions you have with your health care provider.  

## 2015-06-11 NOTE — Progress Notes (Signed)
Subjective:  Alexandra Hardy is a 24 y.o. G1P0 at [redacted]w[redacted]d being seen today for ongoing prenatal care.  Accompanied by husband who helps with interpretation as hospital interpreter not available today.  Patient reports no complaints.  Contractions: Not present.  Vag. Bleeding: None. Movement: Present. Denies leaking of fluid.   The following portions of the patient's history were reviewed and updated as appropriate: allergies, current medications, past family history, past medical history, past social history, past surgical history and problem list.   Objective:   Filed Vitals:   06/11/15 0910  BP: 88/58  Pulse: 76  Weight: 132 lb (59.875 kg)    Fetal Status: Fetal Heart Rate (bpm): 135 Fundal Height: 23 cm Movement: Present     General:  Alert, oriented and cooperative. Patient is in no acute distress.  Skin: Skin is warm and dry. No rash noted.   Cardiovascular: Normal heart rate noted  Respiratory: Normal respiratory effort, no problems with respiration noted  Abdomen: Soft, gravid, appropriate for gestational age. Pain/Pressure: Present     Vaginal: Vag. Bleeding: None.    Vag D/C Character: Thin  Cervix: Not evaluated        Extremities: Normal range of motion.  Edema: None  Mental Status: Normal mood and affect. Normal behavior. Normal judgment and thought content.   Urinalysis: Urine Protein: Negative Urine Glucose: Negative  Assessment and Plan:  Pregnancy: G1P0 at [redacted]w[redacted]d  1. Encounter for supervision of normal first pregnancy in second trimester Contraception counseling done with couple, information given to them to review at home. Preterm labor symptoms and general obstetric precautions including but not limited to vaginal bleeding, contractions, leaking of fluid and fetal movement were reviewed in detail with the patient. Please refer to After Visit Summary for other counseling recommendations.  Return in about 4 weeks (around 07/09/2015) for OB Visit, 1 hr GTT, 3rd  trimester labs.   Tereso NewcomerUgonna A Anyanwu, MD

## 2015-06-12 ENCOUNTER — Encounter: Payer: Self-pay | Admitting: Obstetrics & Gynecology

## 2015-07-10 ENCOUNTER — Encounter: Payer: Self-pay | Admitting: Obstetrics and Gynecology

## 2015-07-25 ENCOUNTER — Ambulatory Visit (INDEPENDENT_AMBULATORY_CARE_PROVIDER_SITE_OTHER): Payer: Self-pay | Admitting: Obstetrics & Gynecology

## 2015-07-25 VITALS — BP 103/65 | HR 88 | Wt 136.0 lb

## 2015-07-25 DIAGNOSIS — Z36 Encounter for antenatal screening of mother: Secondary | ICD-10-CM

## 2015-07-25 DIAGNOSIS — Z3403 Encounter for supervision of normal first pregnancy, third trimester: Secondary | ICD-10-CM

## 2015-07-25 DIAGNOSIS — Z23 Encounter for immunization: Secondary | ICD-10-CM

## 2015-07-25 LAB — CBC
HCT: 34.7 % — ABNORMAL LOW (ref 36.0–46.0)
HEMOGLOBIN: 11.6 g/dL — AB (ref 12.0–15.0)
MCH: 32.7 pg (ref 26.0–34.0)
MCHC: 33.4 g/dL (ref 30.0–36.0)
MCV: 97.7 fL (ref 78.0–100.0)
MPV: 13.2 fL — ABNORMAL HIGH (ref 8.6–12.4)
PLATELETS: 227 10*3/uL (ref 150–400)
RBC: 3.55 MIL/uL — AB (ref 3.87–5.11)
RDW: 13.5 % (ref 11.5–15.5)
WBC: 6.7 10*3/uL (ref 4.0–10.5)

## 2015-07-25 NOTE — Progress Notes (Signed)
Subjective:  Alexandra Hardy is a 24 y.o.H  G1P0 at [redacted]w[redacted]d being seen today for ongoing prenatal care.  Patient reports no complaints.  Contractions: Not present.  Vag. Bleeding: None. Movement: Present. Denies leaking of fluid.   The following portions of the patient's history were reviewed and updated as appropriate: allergies, current medications, past family history, past medical history, past social history, past surgical history and problem list.   Objective:   Filed Vitals:   07/25/15 0953  BP: 103/65  Pulse: 88  Weight: 136 lb (61.689 kg)    Fetal Status: Fetal Heart Rate (bpm): 141   Movement: Present     General:  Alert, oriented and cooperative. Patient is in no acute distress.  Skin: Skin is warm and dry. No rash noted.   Cardiovascular: Normal heart rate noted  Respiratory: Normal respiratory effort, no problems with respiration noted  Abdomen: Soft, gravid, appropriate for gestational age. Pain/Pressure: Present     Pelvic: Vag. Bleeding: None Vag D/C Character: Thin   Cervical exam deferred        Extremities: Normal range of motion.  Edema: Trace  Mental Status: Normal mood and affect. Normal behavior. Normal judgment and thought content.   Urinalysis: Urine Protein: Trace Urine Glucose: Negative  Assessment and Plan:  Pregnancy: G1P0 at [redacted]w[redacted]d  1. Encounter for supervision of normal first pregnancy in third trimester  - CBC - HIV antibody - RPR - Glucose Tolerance, 1 HR (50g) - Tdap vaccine greater than or equal to 7yo IM - Flu Vaccine QUAD 36+ mos IM (Fluarix, Quad PF) - Zika precautions - Father to get Tdap at health dept  Preterm labor symptoms and general obstetric precautions including but not limited to vaginal bleeding, contractions, leaking of fluid and fetal movement were reviewed in detail with the patient. Please refer to After Visit Summary for other counseling recommendations.  Return in about 3 weeks (around 08/15/2015).   Alexandra Bossier,  MD

## 2015-07-25 NOTE — Progress Notes (Signed)
Pt c/o occasional pressure with movement of the baby.

## 2015-07-26 LAB — GLUCOSE TOLERANCE, 1 HOUR (50G) W/O FASTING: Glucose, 1 Hour GTT: 163 mg/dL — ABNORMAL HIGH (ref 70–140)

## 2015-07-26 LAB — RPR

## 2015-07-27 LAB — HIV ANTIBODY (ROUTINE TESTING W REFLEX): HIV: NONREACTIVE

## 2015-08-04 ENCOUNTER — Encounter: Payer: Self-pay | Admitting: Obstetrics & Gynecology

## 2015-08-04 ENCOUNTER — Telehealth: Payer: Self-pay | Admitting: *Deleted

## 2015-08-04 DIAGNOSIS — O24419 Gestational diabetes mellitus in pregnancy, unspecified control: Secondary | ICD-10-CM | POA: Insufficient documentation

## 2015-08-04 NOTE — Telephone Encounter (Signed)
Spoke to pt husband about results of 1 hr GTT and that pt will need to come in on Friday at 815 for 3 hr GTT, informed him to make sure that pt comes in fasting that morning. Pt speaks limited English.

## 2015-08-04 NOTE — Telephone Encounter (Signed)
-----   Message from [name], MD sent at 08/04/2015  9:51 AM EDT ----- She will need a 3 hour GTT. Thanks

## 2015-08-05 ENCOUNTER — Ambulatory Visit (INDEPENDENT_AMBULATORY_CARE_PROVIDER_SITE_OTHER): Payer: Self-pay | Admitting: Family Medicine

## 2015-08-05 ENCOUNTER — Encounter: Payer: Self-pay | Admitting: Physician Assistant

## 2015-08-05 VITALS — BP 90/53 | HR 79 | Wt 141.0 lb

## 2015-08-05 DIAGNOSIS — R7309 Other abnormal glucose: Secondary | ICD-10-CM

## 2015-08-05 DIAGNOSIS — M545 Low back pain, unspecified: Secondary | ICD-10-CM

## 2015-08-05 DIAGNOSIS — R7302 Impaired glucose tolerance (oral): Secondary | ICD-10-CM

## 2015-08-05 DIAGNOSIS — Z3403 Encounter for supervision of normal first pregnancy, third trimester: Secondary | ICD-10-CM

## 2015-08-05 LAB — POCT URINALYSIS DIPSTICK
Bilirubin, UA: NEGATIVE
Glucose, UA: NEGATIVE
Ketones, UA: NEGATIVE
Nitrite, UA: NEGATIVE
SPEC GRAV UA: 1.015
UROBILINOGEN UA: 0.2
pH, UA: 7.5

## 2015-08-05 NOTE — Progress Notes (Signed)
Subjective:  Alexandra Hardy is a 24 y.o. G1P0 at [redacted]w[redacted]d being seen today for ongoing prenatal care.  Patient reports backache.  Contractions: Irregular.  Vag. Bleeding: None. Movement: Present. Denies leaking of fluid.   The following portions of the patient's history were reviewed and updated as appropriate: allergies, current medications, past family history, past medical history, past social history, past surgical history and problem list.   Objective:   Filed Vitals:   08/05/15 1121  BP: 90/53  Pulse: 79  Weight: 141 lb (63.957 kg)    Fetal Status: Fetal Heart Rate (bpm): 140 Fundal Height: 29 cm Movement: Present     General:  Alert, oriented and cooperative. Patient is in no acute distress.  Skin: Skin is warm and dry. No rash noted.   Cardiovascular: Normal heart rate noted  Respiratory: Normal respiratory effort, no problems with respiration noted  Abdomen: Soft, gravid, appropriate for gestational age. Pain/Pressure: Present     Pelvic: Vag. Bleeding: None Vag D/C Character: Thin   Cervical exam performed Dilation: Closed Effacement (%): Thick Station: Ballotable  Extremities: Normal range of motion.  Edema: Trace  Mental Status: Normal mood and affect. Normal behavior. Normal judgment and thought content.   Urinalysis: Urine Protein: Trace Urine Glucose: Negative  Assessment and Plan:  Pregnancy: G1P0 at [redacted]w[redacted]d  1. Abnormal glucose tolerance test For 3 hour on Friday  2. Encounter for supervision of normal first pregnancy in third trimester Continue routine prenatal care.   Preterm labor symptoms and general obstetric precautions including but not limited to vaginal bleeding, contractions, leaking of fluid and fetal movement were reviewed in detail with the patient. Please refer to After Visit Summary for other counseling recommendations.  Return for OB fu.   Reva Bores, MD

## 2015-08-05 NOTE — Progress Notes (Signed)
Pt came to office c/o swelling and lower back pain on and off. Will send urine cx.

## 2015-08-05 NOTE — Patient Instructions (Addendum)
Tercer trimestre de embarazo (Third Trimester of Pregnancy) El tercer trimestre va desde la semana29 hasta la 42, desde el sptimo hasta el noveno mes, y es la poca en la que el feto crece ms rpidamente. Hacia el final del noveno mes, el feto mide alrededor de 20pulgadas (45cm) de largo y pesa entre 6 y 10 libras (2,700 y 4,500kg).  CAMBIOS EN EL ORGANISMO Su organismo atraviesa por muchos cambios durante el embarazo, y estos varan de una mujer a otra.   Seguir aumentando de peso. Es de esperar que aumente entre 25 y 35libras (11 y 16kg) hacia el final del embarazo.  Podrn aparecer las primeras estras en las caderas, el abdomen y las mamas.  Puede tener necesidad de orinar con ms frecuencia porque el feto baja hacia la pelvis y ejerce presin sobre la vejiga.  Debido al embarazo podr sentir acidez estomacal con frecuencia.  Puede estar estreida, ya que ciertas hormonas enlentecen los movimientos de los msculos que empujan los desechos a travs de los intestinos.  Pueden aparecer hemorroides o abultarse e hincharse las venas (venas varicosas).  Puede sentir dolor plvico debido al aumento de peso y a que las hormonas del embarazo relajan las articulaciones entre los huesos de la pelvis. El dolor de espalda puede ser consecuencia de la sobrecarga de los msculos que soportan la postura.  Tal vez haya cambios en el cabello que pueden incluir su engrosamiento, crecimiento rpido y cambios en la textura. Adems, a algunas mujeres se les cae el cabello durante o despus del embarazo, o tienen el cabello seco o fino. Lo ms probable es que el cabello se le normalice despus del nacimiento del beb.  Las mamas seguirn creciendo y le dolern. A veces, puede haber una secrecin amarilla de las mamas llamada calostro.  El ombligo puede salir hacia afuera.  Puede sentir que le falta el aire debido a que se expande el tero.  Puede notar que el feto "baja" o lo siente ms bajo, en el  abdomen.  Puede tener una prdida de secrecin mucosa con sangre. Esto suele ocurrir en el trmino de unos pocos das a una semana antes de que comience el trabajo de parto.  El cuello del tero se vuelve delgado y blando (se borra) cerca de la fecha de parto. QU DEBE ESPERAR EN LOS EXMENES PRENATALES  Le harn exmenes prenatales cada 2semanas hasta la semana36. A partir de ese momento le harn exmenes semanales. Durante una visita prenatal de rutina:  La pesarn para asegurarse de que usted y el feto estn creciendo normalmente.  Le tomarn la presin arterial.  Le medirn el abdomen para controlar el desarrollo del beb.  Se escucharn los latidos cardacos fetales.  Se evaluarn los resultados de los estudios solicitados en visitas anteriores.  Le revisarn el cuello del tero cuando est prxima la fecha de parto para controlar si este se ha borrado. Alrededor de la semana36, el mdico le revisar el cuello del tero. Al mismo tiempo, realizar un anlisis de las secreciones del tejido vaginal. Este examen es para determinar si hay un tipo de bacteria, estreptococo Grupo B. El mdico le explicar esto con ms detalle. El mdico puede preguntarle lo siguiente:  Cmo le gustara que fuera el parto.  Cmo se siente.  Si siente los movimientos del beb.  Si ha tenido sntomas anormales, como prdida de lquido, sangrado, dolores de cabeza intensos o clicos abdominales.  Si tiene alguna pregunta. Otros exmenes o estudios de deteccin que pueden realizarse   durante el tercer trimestre incluyen lo siguiente:  Anlisis de sangre para controlar las concentraciones de hierro (anemia).  Controles fetales para determinar su salud, nivel de actividad y crecimiento. Si tiene alguna enfermedad o hay problemas durante el embarazo, le harn estudios. FALSO TRABAJO DE PARTO Es posible que sienta contracciones leves e irregulares que finalmente desaparecen. Se llaman contracciones de  Braxton Hicks o falso trabajo de parto. Las contracciones pueden durar horas, das o incluso semanas, antes de que el verdadero trabajo de parto se inicie. Si las contracciones ocurren a intervalos regulares, se intensifican o se hacen dolorosas, lo mejor es que la revise el mdico.  SIGNOS DE TRABAJO DE PARTO   Clicos de tipo menstrual.  Contracciones cada 5minutos o menos.  Contracciones que comienzan en la parte superior del tero y se extienden hacia abajo, a la zona inferior del abdomen y la espalda.  Sensacin de mayor presin en la pelvis o dolor de espalda.  Una secrecin de mucosidad acuosa o con sangre que sale de la vagina. Si tiene alguno de estos signos antes de la semana37 del embarazo, llame a su mdico de inmediato. Debe concurrir al hospital para que la controlen inmediatamente. INSTRUCCIONES PARA EL CUIDADO EN EL HOGAR   Evite fumar, consumir hierbas, beber alcohol y tomar frmacos que no le hayan recetado. Estas sustancias qumicas afectan la formacin y el desarrollo del beb.  Siga las indicaciones del mdico en relacin con el uso de medicamentos. Durante el embarazo, hay medicamentos que son seguros de tomar y otros que no.  Haga actividad fsica solo en la forma indicada por el mdico. Sentir clicos uterinos es un buen signo para detener la actividad fsica.  Contine comiendo alimentos que sanos con regularidad.  Use un sostn que le brinde buen soporte si le duelen las mamas.  No se d baos de inmersin en agua caliente, baos turcos ni saunas.  Colquese el cinturn de seguridad cuando conduzca.  No coma carne cruda ni queso sin cocinar; evite el contacto con las bandejas sanitarias de los gatos y la tierra que estos animales usan. Estos elementos contienen grmenes que pueden causar defectos congnitos en el beb.  Tome las vitaminas prenatales.  Si est estreida, pruebe un laxante suave (si el mdico lo autoriza). Consuma ms alimentos ricos en  fibra, como vegetales y frutas frescos y cereales integrales. Beba gran cantidad de lquido para mantener la orina de tono claro o color amarillo plido.  Dese baos de asiento con agua tibia para aliviar el dolor o las molestias causadas por las hemorroides. Use una crema para las hemorroides si el mdico la autoriza.  Si tiene venas varicosas, use medias de descanso. Eleve los pies durante 15minutos, 3 o 4veces por da. Limite la cantidad de sal en su dieta.  Evite levantar objetos pesados, use zapatos de tacones bajos y mantenga una buena postura.  Descanse con las piernas elevadas si tiene calambres o dolor de cintura.  Visite a su dentista si no lo ha hecho durante el embarazo. Use un cepillo de dientes blando para higienizarse los dientes y psese el hilo dental con suavidad.  Puede seguir manteniendo relaciones sexuales, a menos que el mdico le indique lo contrario.  No haga viajes largos excepto que sea absolutamente necesario y solo con la autorizacin del mdico.  Tome clases prenatales para entender, practicar y hacer preguntas sobre el trabajo de parto y el parto.  Haga un ensayo de la partida al hospital.  Prepare el bolso que   llevar al hospital.  Prepare la habitacin del beb.  Concurra a todas las visitas prenatales segn las indicaciones de su mdico. SOLICITE ATENCIN MDICA SI:  No est segura de que est en trabajo de parto o de que ha roto la bolsa de las aguas.  Tiene mareos.  Siente clicos leves, presin en la pelvis o dolor persistente en el abdomen.  Tiene nuseas, vmitos o diarrea persistentes.  Tiene secrecin vaginal con mal olor.  Siente dolor al [employer]. SOLICITE ATENCIN MDICA DE INMEDIATO SI:   Tiene fiebre.  Tiene una prdida de lquido por la vagina.  Tiene sangrado o pequeas prdidas vaginales.  Siente dolor intenso o clicos en el abdomen.  Sube o baja de peso rpidamente.  Tiene dificultad para respirar y siente dolor de  pecho.  Sbitamente se le hinchan mucho el rostro, las [place], los tobillos, los pies o las piernas.  No ha sentido los movimientos del beb durante Georgianne Fick.  Siente un dolor de cabeza intenso que no se alivia con medicamentos.  Hay cambios en la visin. Document Released: 08/18/2005 Document Revised: 11/13/2013 [facility] Patient Information 2015 [place], [place]. This information is not intended to replace advice given to you by your health care provider. Make sure you discuss any questions you have with your health care provider.  Tercer trimestre de Psychiatrist (Third Trimester of Pregnancy) El tercer trimestre va desde la semana29 hasta la 42, desde el sptimo hasta el noveno mes, y es la poca en la que el feto crece ms rpidamente. Hacia el final del noveno mes, el feto mide alrededor de 20pulgadas (45cm) de largo y pesa entre 6 y 10 libras (2,700 y 48,500kg).  CAMBIOS EN EL ORGANISMO Su organismo atraviesa por muchos cambios durante el [place], y estos varan de Neomia Dear mujer a Educational psychologist.   Seguir [employer]. Es de esperar que aumente entre 25 y 35libras (11 y 16kg) hacia el final del Psychiatrist.  Podrn aparecer las primeras [employer] caderas, el abdomen y las [place].  Puede tener necesidad de Geographical information systems officer con ms frecuencia porque el feto baja hacia la pelvis y ejerce presin sobre la vejiga.  Debido al Vanetta Mulders podr sentir Anthoney Harada estomacal con frecuencia.  Puede estar estreida, ya que ciertas hormonas enlentecen los movimientos de los msculos que [employer] desechos a travs de los intestinos.  Pueden aparecer hemorroides o abultarse e hincharse las venas (venas varicosas).  Puede sentir dolor plvico debido al [employer] y a que las hormonas del Management consultant las articulaciones entre los huesos de la pelvis. El dolor de espalda puede ser consecuencia de la sobrecarga de los msculos que soportan la [place].  Tal vez haya cambios en el cabello que pueden incluir su  engrosamiento, crecimiento rpido y cambios en la textura. Adems, a algunas mujeres se les cae el cabello durante o despus del embarazo, o tienen el cabello seco o fino. Lo ms probable es que el cabello se le normalice despus del nacimiento del beb.  Las [employer] seguirn creciendo y Development worker, community. A veces, puede haber una secrecin amarilla de las mamas llamada calostro.  El ombligo puede salir hacia afuera.  Puede sentir que le falta el aire debido a que se expande el tero.  Puede notar que el feto "baja" o lo siente ms bajo, en el abdomen.  Puede tener una prdida de secrecin mucosa con sangre. Esto suele ocurrir en el trmino de unos [address] a una semana antes de que comience el [place] de  parto.  El cuello del tero se vuelve delgado y blando (se borra) cerca de la fecha de [place]. QU DEBE ESPERAR EN LOS EXMENES PRENATALES  Le harn exmenes prenatales cada 2semanas hasta la semana36. A partir de ese momento le harn exmenes semanales. Durante una visita prenatal de rutina:  La pesarn para asegurarse de que usted y el feto estn creciendo normalmente.  Le tomarn la presin arterial.  Le medirn el abdomen para controlar el desarrollo del beb.  Se escucharn los latidos cardacos fetales.  Se evaluarn los resultados de los estudios solicitados en visitas anteriores.  Le revisarn el cuello del tero cuando est prxima la fecha de parto para controlar si este se ha borrado. Alrededor de la semana36, el mdico le revisar el cuello del tero. Al mismo tiempo, realizar un anlisis de las secreciones del tejido vaginal. Este examen es para determinar si hay un tipo de bacteria, estreptococo Grupo B. El mdico le explicar esto con ms detalle. El mdico puede preguntarle lo siguiente:  Cmo le gustara que fuera el [place].  Cmo se siente.  Si siente los movimientos del beb.  Si ha tenido sntomas anormales, como prdida de lquido, [place], dolores de cabeza  intensos o clicos abdominales.  Si tiene [employer]. Otros exmenes o estudios de deteccin que pueden realizarse durante el tercer trimestre incluyen lo siguiente:  Anlisis de sangre para controlar las concentraciones de hierro (anemia).  Controles fetales para determinar su salud, nivel de [place] y Designer, jewellery. Si tiene [place] enfermedad o hay problemas durante el embarazo, le harn estudios. FALSO TRABAJO DE PARTO Es posible que sienta contracciones leves e irregulares que finalmente desaparecen. Se llaman contracciones de [address] o falso trabajo de [place]. Las [employer] pueden durar horas, [address] o incluso semanas, antes de que el verdadero trabajo de parto se inicie. Si las contracciones ocurren a intervalos regulares, se intensifican o se hacen dolorosas, lo mejor es que la revise el mdico.  SIGNOS DE TRABAJO DE PARTO   Clicos de tipo menstrual.  Contracciones cada o menos.  Contracciones que comienzan en la parte superior del tero y se extienden hacia abajo, a la zona inferior del abdomen y la espalda.  Sensacin de mayor presin en la pelvis o dolor de espalda.  Una secrecin de mucosidad acuosa o con sangre que sale de la vagina. Si tiene alguno de estos signos antes de la semana37 del Psychiatrist, llame a su mdico de inmediato. Debe concurrir al hospital para que la controlen inmediatamente. INSTRUCCIONES PARA EL CUIDADO EN EL HOGAR   Evite fumar, consumir hierbas, beber alcohol y tomar frmacos que no le hayan recetado. Estas sustancias qumicas afectan la formacin y el desarrollo del beb.  Siga las indicaciones del mdico en relacin con el uso de medicamentos. Durante el embarazo, hay medicamentos que son seguros de tomar y otros que no.  Haga actividad fsica solo en la forma indicada por el mdico. Sentir clicos uterinos es un buen signo para Restaurant manager, fast food actividad fsica.  Contine comiendo alimentos que sanos con regularidad.  Use un  sostn que le brinde buen soporte si le [employer].  No se d baos de inmersin en agua caliente, baos turcos ni saunas.  Colquese el cinturn de seguridad cuando conduzca.  No coma carne cruda ni queso sin cocinar; evite el contacto con las bandejas sanitarias de los gatos y la tierra que estos animales usan. Estos elementos contienen grmenes que pueden causar defectos congnitos en el beb.  [employer]  las vitaminas prenatales.  Si est estreida, pruebe un laxante suave (si el mdico lo autoriza). Consuma ms alimentos ricos en fibra, como vegetales y frutas frescos y Radiation protection practitioner. Beba gran cantidad de lquido para mantener la orina de tono claro o color amarillo plido.  Dese baos de asiento con agua tibia para Engineer, materials o las molestias causadas por las hemorroides. Use una crema para las hemorroides si el mdico la autoriza.  Si tiene venas varicosas, use medias de descanso. Eleve los pies durante , 3 o 4veces por da. Limite la cantidad de sal en su dieta.  Evite levantar objetos pesados, use zapatos de tacones bajos y [place].  Descanse con las piernas elevadas si tiene calambres o dolor de cintura.  Visite a su dentista si no lo ha Occupational hygienist. Use un cepillo de dientes blando para higienizarse los dientes y psese el hilo dental con suavidad.  Puede seguir [employer], a menos que el mdico le indique lo contrario.  No haga viajes largos excepto que sea absolutamente necesario y solo con la autorizacin del mdico.  Tome clases prenatales para Financial trader, Education administrator y hacer preguntas sobre el [place] de parto y [place].  Haga un ensayo de la partida al hospital.  Prepare el bolso que llevar al hospital.  Prepare la habitacin del beb.  Concurra a todas las visitas prenatales segn las indicaciones de su mdico. SOLICITE ATENCIN MDICA SI:  No est segura de que est en trabajo de parto o  de que ha roto la bolsa de las aguas.  Tiene mareos.  Siente clicos leves, presin en la pelvis o dolor persistente en el abdomen.  Tiene nuseas, vmitos o diarrea persistentes.  Tiene secrecin vaginal con mal olor.  Siente dolor al [employer]. SOLICITE ATENCIN MDICA DE INMEDIATO SI:   Tiene fiebre.  Tiene una prdida de lquido por la vagina.  Tiene sangrado o pequeas prdidas vaginales.  Siente dolor intenso o clicos en el abdomen.  Sube o baja de peso rpidamente.  Tiene dificultad para respirar y siente dolor de pecho.  Sbitamente se le hinchan mucho el rostro, las [place], los tobillos, los pies o las piernas.  No ha sentido los movimientos del beb durante Georgianne Fick.  Siente un dolor de cabeza intenso que no se alivia con medicamentos.  Hay cambios en la visin. Document Released: 08/18/2005 Document Revised: 11/13/2013 [facility] Patient Information 2015 [place], [place]. This information is not intended to replace advice given to you by your health care provider. Make sure you discuss any questions you have with your health care provider.  Lactancia materna (Breastfeeding) Decidir Museum/gallery exhibitions officer es una de las mejores elecciones que puede hacer por usted y su beb. El cambio hormonal durante el Psychiatrist produce el desarrollo del tejido mamario y [place] la cantidad y el tamao de los conductos galactforos. Estas hormonas tambin permiten que las protenas, los azcares y las grasas de la sangre produzcan la [employer] materna en las glndulas productoras de [place]. Las hormonas impiden que la leche materna sea liberada antes del nacimiento del beb, adems de impulsar el flujo de leche luego del nacimiento. Una vez que ha comenzado a Museum/gallery exhibitions officer, Conservation officer, nature beb, as Immunologist succin o Theatre manager, pueden estimular la liberacin de [place] de las glndulas productoras de [place].  LOS BENEFICIOS DE AMAMANTAR Para el beb  La primera leche (calostro) ayuda a Careers information officer funcionamiento del  sistema digestivo del beb.  La leche tiene anticuerpos  que ayudan a prevenir las infecciones en el beb.  El beb tiene una menor incidencia de asma, alergias y del sndrome de muerte sbita del lactante.  Los nutrientes en la [place] materna son mejores para el beb que la [place] maternizada y estn preparados exclusivamente para cubrir las necesidades del beb.  La leche materna mejora el desarrollo cerebral del beb.  Es menos probable que el beb desarrolle otras enfermedades, como obesidad infantil, asma o diabetes mellitus de tipo 2. Para usted   La lactancia materna favorece el desarrollo de un vnculo muy especial entre la madre y el beb.  Es conveniente. La leche materna siempre est disponible a la Human resources officer y es [place].  La lactancia materna ayuda a quemar caloras y a perder el peso ganado durante el [place].  Favorece la contraccin del tero al tamao que tena antes del embarazo de manera ms rpida y disminuye el sangrado (loquios) despus del parto.  La lactancia materna contribuye a reducir Nurse, adult de desarrollar diabetes mellitus de tipo 2, osteoporosis o cncer de mama o de ovario en el futuro. SIGNOS DE QUE EL BEB EST HAMBRIENTO Primeros signos de [address] de [place].  Se estira.  Mueve la cabeza de un lado a otro.  Mueve la cabeza y abre la boca cuando se le toca la mejilla o la comisura de la boca (reflejo de bsqueda).  Aumenta las vocalizaciones, tales como sonidos de succin, se relame los labios, emite arrullos, suspiros, o chirridos.  Mueve la [employer] boca.  Se chupa con ganas los dedos o las manos. Signos tardos de [employer].  Llora de manera intermitente. Signos de [employer] signos de hambre extrema requerirn que lo calme y lo consuele antes de que el beb pueda alimentarse adecuadamente. No espere a que se manifiesten los siguientes signos de hambre extrema para  comenzar a Museum/gallery exhibitions officer:   Designer, jewellery.  Llanto intenso y fuerte.   Gritos. INFORMACIN BSICA SOBRE LA LACTANCIA MATERNA Iniciacin de la lactancia materna  Encuentre un lugar cmodo para sentarse o acostarse, con un buen respaldo para el cuello y la espalda.  Coloque una almohada o una manta enrollada debajo del beb para acomodarlo a la altura de la mama (si est sentada). Las almohadas para Museum/gallery exhibitions officer se han diseado especialmente a fin de servir de apoyo para los brazos y el beb [employer].  Asegrese de que el abdomen del beb est frente al suyo.  Masajee suavemente la mama. Con las yemas de los dedos, masajee la pared del pecho hacia el pezn en un movimiento circular. Esto estimula el flujo de [place]. Es posible que Engineer, manufacturing systems este movimiento mientras amamanta si la leche fluye lentamente.  Sostenga la mama con el pulgar por arriba del pezn y los otros 4 dedos por debajo de la mama. Asegrese de que los dedos se encuentren lejos del pezn y de la boca del beb.  Empuje suavemente los labios del beb con el pezn o con el dedo.  Cuando la boca del beb se abra lo suficiente, acrquelo rpidamente a la mama e introduzca todo el pezn y la zona oscura que lo rodea (areola), tanto como sea posible, dentro de la boca del beb.  Debe haber ms areola visible por arriba del labio superior del beb que por debajo del labio inferior.  La lengua del beb debe estar entre la enca inferior y la [place].  Asegrese de que la boca del  beb est en la posicin correcta alrededor del pezn (prendida). Los labios del beb deben crear un sello sobre la mama y estar doblados hacia afuera (invertidos).  Es comn que el beb succione durante 2 a 3 minutos para que comience el flujo de [place]. Cmo debe prenderse Es muy importante que le ensee al beb cmo prenderse adecuadamente a la mama. Si el beb no se prende adecuadamente, puede causarle dolor en el pezn y reducir la produccin  de [place], y hacer que el beb tenga un escaso aumento de [place]. Adems, si el beb no se prende adecuadamente al pezn, puede tragar aire durante la alimentacin. Esto puede causarle molestias al beb. Hacer eructar al beb al [place] de mama puede ayudarlo a liberar el aire. Sin embargo, ensearle al beb cmo prenderse a la mama adecuadamente es la mejor manera de evitar que se sienta molesto por tragar Oceanographer se alimenta. Signos de que el beb se ha prendido adecuadamente al pezn:   Payton Doughty o succiona de modo silencioso, sin causarle dolor.  Se escucha que traga cada 3 o 4 succiones.   Hay movimientos musculares por arriba y por delante de sus odos al Printmaker. Signos de que el beb no se ha prendido Audiological scientist al pezn:   Hace ruidos de succin o de chasquido mientras se alimenta.  Siente dolor en el pezn. Si cree que el beb no se prendi correctamente, deslice el dedo en la comisura de la boca y [employer] las encas del beb para interrumpir la succin. Intente comenzar a amamantar nuevamente. Signos de Fish farm manager Signos del beb:   Disminuye gradualmente el nmero de succiones o cesa la succin por completo.  Se duerme.  Relaja el cuerpo.  Retiene una pequea cantidad de [facility] boca.  Se desprende solo del pecho. Signos que presenta usted:  Las mamas han aumentado la firmeza, el peso y el tamao 1 a 3 horas despus de Museum/gallery exhibitions officer.  Estn ms blandas inmediatamente despus de amamantar.  Un aumento del volumen de [place], y tambin un cambio en su consistencia y color se producen hacia el quinto da de Tour manager.  Los pezones no duelen, ni estn agrietados ni sangran. Signos de que su beb recibe la cantidad de leche suficiente  Moja al menos 3 paales en 24 horas. La orina debe ser clara y de color amarillo plido a los 5 [address] de [place].  Defeca al menos 3 veces en 24 horas a los 5 [address]  [address] de [address] La materia fecal debe ser  blanda y [place].  Defeca al menos 3 veces en 24 horas a los [address] de [address] La materia fecal debe ser grumosa y [place].  No registra una prdida de peso mayor del 10% del peso al nacer durante los primeros 3 [address] de [place].  Aumenta de peso un promedio de 4 a 7onzas (113 a 198g) por semana despus de los 4 [address] de vida.  Aumenta de [place], [place], de [place] uniforme a Glass blower/designer de los 5 [address] de vida, sin Passenger transport manager prdida de peso despus de las 2semanas de vida. Despus de alimentarse, es posible que el beb regurgite una pequea cantidad. Esto es frecuente. FRECUENCIA Y DURACIN DE LA LACTANCIA MATERNA El amamantamiento frecuente la ayudar a producir ms [place] y a Education officer, community de Engineer, mining en los pezones e hinchazn en las [place]. Alimente al beb cuando muestre signos de hambre o si siente la necesidad de reducir la congestin de las [place]. Esto  se denomina "lactancia a demanda". Evite el uso del chupete mientras trabaja para establecer la lactancia (las primeras 4 a 6 semanas despus del nacimiento del beb). Despus de este perodo, podr ofrecerle un chupete. Las investigaciones demostraron que el uso del chupete durante el primer ao de vida del beb disminuye el riesgo de desarrollar el sndrome de muerte sbita del lactante (SMSL). Permita que el nio se alimente en cada mama todo lo que desee. Contine amamantando al beb hasta que haya terminado de alimentarse. Cuando el beb se desprende o se queda dormido mientras se est alimentando de la primera mama, ofrzcale la segunda. Debido a que, con frecuencia, los recin [place] las primeras semanas de vida, es posible que deba despertar al beb para alimentarlo. Los horarios de Acupuncturist de un beb a otro. Sin embargo, las siguientes reglas pueden servir como gua para ayudarla a Lawyer que el beb se alimenta adecuadamente:  Se puede amamantar a los recin nacidos (bebs de 4 semanas o menos de  vida) cada 1 a 3 horas.  No deben transcurrir ms de 3 horas durante el da o 5 horas durante la noche sin que se amamante a los recin nacidos.  Debe amamantar al beb 8 veces como mnimo en un perodo de 24 horas, hasta que comience a introducir slidos en su dieta, a los 6 meses de vida aproximadamente. EXTRACCIN DE [employer] MATERNA La extraccin y Contractor de la leche materna le permiten asegurarse de que el beb se alimente exclusivamente de [place], aun en momentos en los que no puede amamantar. Esto tiene especial importancia si debe regresar al Aleen Campi en el perodo en que an est amamantando o si no puede estar presente en los momentos en que el beb debe alimentarse. Su asesor en lactancia puede orientarla sobre cunto tiempo es seguro almacenar [place].  El sacaleche es un aparato que le permite extraer leche de la mama a un recipiente estril. Luego, la leche materna extrada puede almacenarse en un refrigerador o Electrical engineer. Algunos sacaleches son Birdie Riddle, Delaney Meigs otros son elctricos. Consulte a su asesor en lactancia qu tipo ser ms conveniente para usted. Los sacaleches se pueden comprar; sin embargo, algunos hospitales y grupos de apoyo a la lactancia materna alquilan Sports coach. Un asesor en lactancia puede ensearle cmo extraer [employer], en caso de que prefiera no usar un sacaleche.  CMO CUIDAR LAS MAMAS DURANTE LA LACTANCIA MATERNA Los pezones se secan, agrietan y duelen durante la Tour manager. Las siguientes recomendaciones pueden ayudarla a Pharmacologist las [employer] y sanas:  Careers information officer usar jabn en los pezones.  Use un sostn de soporte. Aunque no son esenciales, las camisetas sin mangas o los sostenes especiales para Museum/gallery exhibitions officer estn diseados para acceder fcilmente a las mamas, para Museum/gallery exhibitions officer sin tener que quitarse todo el sostn o la camiseta. Evite usar sostenes con aro o sostenes muy ajustados.  Seque al aire  sus pezones durante 3 a despus de amamantar al beb.  Utilice solo apsitos de Haematologist sostn para Environmental health practitioner las prdidas de [place]. La prdida de un poco de [employer] tomas es normal.  Utilice lanolina sobre los pezones luego de Museum/gallery exhibitions officer. La lanolina ayuda a mantener la humedad normal de la piel. Si [place] lanolina pura, no tiene que lavarse los pezones antes de volver a Corporate treasurer al beb. La lanolina pura no es txica para el beb. Adems, puede extraer [employer] gotas de [place] y Engineer, maintenance (IT)  suavemente esa [employer], para que la [place] se seque al aire. Durante las primeras semanas despus de dar a luz, algunas mujeres pueden experimentar hinchazn en las mamas (congestin [place]). La congestin puede hacer que sienta las mamas pesadas, calientes y sensibles al tacto. El pico de la congestin ocurre dentro de los 3 a 5 das despus del [place]. Las siguientes recomendaciones pueden ayudarla a Paramedic la congestin:  Vace por completo las mamas al [employer] o Environmental health practitioner. Puede aplicar calor hmedo en las mamas (en la ducha o con toallas hmedas para manos) antes de Museum/gallery exhibitions officer o extraer [employer]. Esto aumenta la circulacin y [place] a que la [place]. Si el beb no vaca por completo las [address] cuando lo [address], extraiga la [place] restante despus de que haya finalizado.  Use un sostn ajustado (para amamantar o comn) o una camiseta sin mangas durante 1 o 2 das para indicar al cuerpo que disminuya ligeramente la produccin de [place].  Aplique compresas de hielo [employer], a menos que le resulte demasiado incmodo.  Asegrese de que el beb est prendido y se encuentre en la posicin correcta mientras lo alimenta. Si la congestin persiste luego de 48 horas o despus de seguir estas recomendaciones, comunquese con su mdico o un Holiday representative. RECOMENDACIONES GENERALES PARA EL CUIDADO DE LA SALUD DURANTE LA LACTANCIA  MATERNA  Consuma alimentos saludables. Alterne comidas y colaciones, y coma 3 de cada una por da. Dado que lo que come [employer], es posible que algunas comidas hagan que su beb se vuelva ms irritable de lo habitual. Evite comer este tipo de alimentos si percibe que afectan de manera negativa al beb.  Beba leche, jugos de fruta y agua para Patent examiner su sed (aproximadamente 10 vasos al Futures trader).  Descanse con frecuencia, reljese y tome sus vitaminas prenatales para evitar la fatiga, el estrs y la anemia.  Contine con los autocontroles de la mama.  Evite masticar y fumar tabaco.  Evite el consumo de alcohol y drogas. Algunos medicamentos, que pueden ser perjudiciales para el beb, pueden pasar a travs de la [employer]. Es importante que consulte a su mdico antes de Medical sales representative, incluidos todos los medicamentos recetados y de [place], as como los suplementos vitamnicos y herbales. Puede quedar embarazada durante la lactancia. Si desea controlar la natalidad, consulte a su mdico cules son las opciones ms seguras para el beb. SOLICITE ATENCIN MDICA SI:   Usted siente que quiere dejar de Museum/gallery exhibitions officer o se siente frustrada con la lactancia.  Siente dolor en las mamas o en los pezones.  Sus pezones estn agrietados o Water quality scientist.  Sus pechos estn irritados, sensibles o calientes.  Tiene un rea hinchada en cualquiera de las mamas.  Siente escalofros o fiebre.  Tiene nuseas o vmitos.  Presenta una secrecin de otro lquido distinto de la leche materna de los pezones.  Sus mamas no se llenan antes de Museum/gallery exhibitions officer al beb para el quinto da despus del [place].  Se siente triste y deprimida.  El beb est demasiado somnoliento como para comer bien.  El beb tiene problemas para dormir.  Moja menos de 3 paales en 24 horas.  Defeca menos de 3 veces en 24 horas.  La piel del beb o la parte blanca de los ojos se vuelven amarillentas.  El  beb no ha aumentado de [place] a los [address] de [place]. SOLICITE ATENCIN MDICA DE INMEDIATO SI:   El beb est muy  cansado Jodelle Red) y no se quiere despertar para comer.  Le sube la fiebre sin causa. Document Released: 11/08/2005 Document Revised: 11/13/2013 [facility] Patient Information 2015 [place], [place]. This information is not intended to replace advice given to you by your health care provider. Make sure you discuss any questions you have with your health care provider.

## 2015-08-07 LAB — CULTURE, OB URINE
COLONY COUNT: NO GROWTH
ORGANISM ID, BACTERIA: NO GROWTH

## 2015-08-08 ENCOUNTER — Other Ambulatory Visit: Payer: Self-pay | Admitting: *Deleted

## 2015-08-08 ENCOUNTER — Ambulatory Visit (INDEPENDENT_AMBULATORY_CARE_PROVIDER_SITE_OTHER): Payer: Self-pay | Admitting: Obstetrics & Gynecology

## 2015-08-08 VITALS — BP 98/61 | HR 87 | Wt 142.0 lb

## 2015-08-08 DIAGNOSIS — R7309 Other abnormal glucose: Secondary | ICD-10-CM

## 2015-08-08 DIAGNOSIS — Z789 Other specified health status: Secondary | ICD-10-CM

## 2015-08-08 DIAGNOSIS — Z3403 Encounter for supervision of normal first pregnancy, third trimester: Secondary | ICD-10-CM

## 2015-08-08 DIAGNOSIS — R7302 Impaired glucose tolerance (oral): Secondary | ICD-10-CM

## 2015-08-08 NOTE — Progress Notes (Signed)
Subjective:  Alexandra Hardy is a 24 y.o. G1P0 at [redacted]w[redacted]d being seen today for ongoing prenatal care.  Patient is Spanish-speaking only, Spanish interpreter present for this encounter.   Patient reports no complaints.  Contractions: Not present.  Vag. Bleeding: None. Movement: Present. Denies leaking of fluid.   The following portions of the patient's history were reviewed and updated as appropriate: allergies, current medications, past family history, past medical history, past social history, past surgical history and problem list.   Objective:   Filed Vitals:   08/08/15 1004  BP: 98/61  Pulse: 87  Weight: 142 lb (64.411 kg)    Fetal Status: Fetal Heart Rate (bpm): 149 Fundal Height: 31 cm Movement: Present     General:  Alert, oriented and cooperative. Patient is in no acute distress.  Skin: Skin is warm and dry. No rash noted.   Cardiovascular: Normal heart rate noted  Respiratory: Normal respiratory effort, no problems with respiration noted  Abdomen: Soft, gravid, appropriate for gestational age. Pain/Pressure: Present     Pelvic: Vag. Bleeding: None Vag D/C Character: Thin   Cervical exam deferred        Extremities: Normal range of motion.  Edema: Trace  Mental Status: Normal mood and affect. Normal behavior. Normal judgment and thought content.   Urinalysis: Urine Protein: Negative Urine Glucose: Negative  Assessment and Plan:  Pregnancy: G1P0 at [redacted]w[redacted]d  1. Encounter for supervision of normal first pregnancy in third trimester 2. Abnormal glucose tolerance test - Glucose tolerance, 3 hours done today, will follow up results and manage accordingly.  Preterm labor symptoms and general obstetric precautions including but not limited to vaginal bleeding, contractions, leaking of fluid and fetal movement were reviewed in detail with the patient. Please refer to After Visit Summary for other counseling recommendations.  Return in about 2 weeks (around 08/22/2015) for OB  Visit.   Tereso Newcomer, MD

## 2015-08-08 NOTE — Patient Instructions (Signed)
Return to clinic for any obstetric concerns or go to [facility] for evaluation  

## 2015-08-09 LAB — GLUCOSE TOLERANCE, 3 HOURS
GLUCOSE, 1 HOUR-GESTATIONAL: 192 mg/dL — AB (ref 70–189)
GLUCOSE, FASTING-GESTATIONAL: 87 mg/dL (ref 65–99)
Glucose Tolerance, 2 hour: 179 mg/dL — ABNORMAL HIGH (ref 70–164)
Glucose, GTT - 3 Hour: 171 mg/dL — ABNORMAL HIGH (ref 70–144)

## 2015-08-11 ENCOUNTER — Encounter: Payer: Self-pay | Admitting: Obstetrics & Gynecology

## 2015-08-11 ENCOUNTER — Telehealth: Payer: Self-pay | Admitting: *Deleted

## 2015-08-11 NOTE — Telephone Encounter (Signed)
Informed pt of 3 hr GTT result and dx of GDM via interpreter on the phone.  Instructed that someone would be contacting her with an appt with the Diabetes Educator.  Pt acknowledged instructions.

## 2015-08-11 NOTE — Telephone Encounter (Signed)
-----   Message from Tereso Newcomer, MD sent at 08/11/2015  1:12 PM EDT ----- Patient has GDM; needs to be set up for education and supplies. Spanish speaking only.

## 2015-08-22 ENCOUNTER — Encounter: Payer: Self-pay | Admitting: Obstetrics & Gynecology

## 2015-08-25 ENCOUNTER — Encounter: Payer: Self-pay | Attending: Obstetrics and Gynecology | Admitting: *Deleted

## 2015-08-25 ENCOUNTER — Ambulatory Visit (INDEPENDENT_AMBULATORY_CARE_PROVIDER_SITE_OTHER): Payer: Self-pay | Admitting: Obstetrics and Gynecology

## 2015-08-25 VITALS — BP 104/61 | HR 86 | Wt 143.2 lb

## 2015-08-25 DIAGNOSIS — Z789 Other specified health status: Secondary | ICD-10-CM

## 2015-08-25 DIAGNOSIS — Z713 Dietary counseling and surveillance: Secondary | ICD-10-CM | POA: Insufficient documentation

## 2015-08-25 DIAGNOSIS — O24419 Gestational diabetes mellitus in pregnancy, unspecified control: Secondary | ICD-10-CM

## 2015-08-25 DIAGNOSIS — Z758 Other problems related to medical facilities and other health care: Secondary | ICD-10-CM

## 2015-08-25 DIAGNOSIS — O24415 Gestational diabetes mellitus in pregnancy, controlled by oral hypoglycemic drugs: Secondary | ICD-10-CM | POA: Insufficient documentation

## 2015-08-25 DIAGNOSIS — O0993 Supervision of high risk pregnancy, unspecified, third trimester: Secondary | ICD-10-CM

## 2015-08-25 DIAGNOSIS — R87810 Cervical high risk human papillomavirus (HPV) DNA test positive: Secondary | ICD-10-CM

## 2015-08-25 LAB — POCT URINALYSIS DIP (DEVICE)
BILIRUBIN URINE: NEGATIVE
Glucose, UA: 100 mg/dL — AB
Hgb urine dipstick: NEGATIVE
NITRITE: NEGATIVE
Protein, ur: NEGATIVE mg/dL
Specific Gravity, Urine: 1.025 (ref 1.005–1.030)
Urobilinogen, UA: 1 mg/dL (ref 0.0–1.0)
pH: 6.5 (ref 5.0–8.0)

## 2015-08-25 MED ORDER — GLUCOSE BLOOD VI STRP: ORAL_STRIP | Status: DC

## 2015-08-25 MED ORDER — ACCU-CHEK FASTCLIX LANCETS MISC: 1.0000 [IU] | Freq: Four times a day (QID) | Status: DC

## 2015-08-25 MED ORDER — ACCU-CHEK NANO SMARTVIEW W/DEVICE KIT: 1.0000 | PACK | Freq: Four times a day (QID) | Status: DC

## 2015-08-25 NOTE — Progress Notes (Signed)
Subjective:  Alexandra Hardy is a 24 y.o. G1P0 at [redacted]w[redacted]d being seen today for ongoing prenatal care.  Patient reports no complaints.  Contractions: Not present.  Vag. Bleeding: None. Movement: Present. Denies leaking of fluid.   The following portions of the patient's history were reviewed and updated as appropriate: allergies, current medications, past family history, past medical history, past social history, past surgical history and problem list.   Objective:   Filed Vitals:   08/25/15 1118  BP: 104/61  Pulse: 86  Weight: 143 lb 3.2 oz (64.955 kg)    Fetal Status: Fetal Heart Rate (bpm): 142   Movement: Present     General:  Alert, oriented and cooperative. Patient is in no acute distress.  Skin: Skin is warm and dry. No rash noted.   Cardiovascular: Normal heart rate noted  Respiratory: Normal respiratory effort, no problems with respiration noted  Abdomen: Soft, gravid, appropriate for gestational age. Pain/Pressure: Present     Pelvic: Vag. Bleeding: None     Cervical exam deferred        Extremities: Normal range of motion.  Edema: Trace  Mental Status: Normal mood and affect. Normal behavior. Normal judgment and thought content.   Urinalysis:      Assessment and Plan:  Pregnancy: G1P0 at [redacted]w[redacted]d  1. Supervision of high-risk pregnancy, third trimester Patient is doing well without complaints  2. Pap smear of cervix shows high risk HPV present   3. Language barrier affecting health care Spanish interpreter present  4. Gestational diabetes mellitus, antepartum Patient to meet with diabetic educator today Reviewed the importance of staying euglycemic during pregnancy. Discussed risks associated with poorly controlled DM including but not limited to risks of fetal macrosomia, shoulder dystocia, neonatal hypoglycemia, IUFD, and neonatal demise. Patient verbalized understanding  Preterm labor symptoms and general obstetric precautions including but not limited to  vaginal bleeding, contractions, leaking of fluid and fetal movement were reviewed in detail with the patient. Please refer to After Visit Summary for other counseling recommendations.  Return in about 1 week (around 09/01/2015).   Catalina Antigua, MD

## 2015-08-25 NOTE — Progress Notes (Signed)
Nutrition Note: GDM diet education  Pt with diagnosis of GDM. Pt has weight gain of 26.2# at [redacted]w[redacted]d which is expected. Pt reports eating 3 meals and 3 snacks daily.  Pt is taking PNV.  Pt received verbal and written education on GDM diet.  Discussed weight gain goals of 25-35 # for pregnancy. Pt plans to BF.  Pt declined WIC.  F/u in 4-6 weeks.  Carloyn Manner, MS,RD,LDN

## 2015-08-25 NOTE — Progress Notes (Signed)
  Patient was seen on 08/25/15 for Gestational Diabetes self-management . The following learning objectives were met by the patient :   States the definition of Gestational Diabetes  States why dietary management is important in controlling blood glucose  States when to check blood glucose levels  Demonstrates proper blood glucose monitoring techniques  States the effect of stress and exercise on blood glucose levels  States the importance of limiting caffeine and abstaining from alcohol and smoking  Plan:  Consider  increasing your activity level by walking daily as tolerated Begin checking BG before breakfast and 2 hours after first bit of breakfast, lunch and dinner after  as directed by MD  Take medication  as directed by MD  Blood glucose monitor given: [employer] Lot # [id number]   Exp: 06/21/17 Blood glucose reading: 2hpp127m/dl follow taco, DGabon Patient instructed to monitor glucose levels: FBS: 60 - <90 2 hour: <120  Patient received the following handouts:  Nutrition Diabetes and Pregnancy  Carbohydrate Counting List  Meal Planning worksheet  Patient will be seen for follow-up as needed.

## 2015-09-01 ENCOUNTER — Ambulatory Visit (INDEPENDENT_AMBULATORY_CARE_PROVIDER_SITE_OTHER): Payer: Self-pay | Admitting: Obstetrics & Gynecology

## 2015-09-01 VITALS — BP 99/72 | HR 71 | Temp 97.5°F | Wt 139.8 lb

## 2015-09-01 DIAGNOSIS — O0993 Supervision of high risk pregnancy, unspecified, third trimester: Secondary | ICD-10-CM

## 2015-09-01 DIAGNOSIS — O24419 Gestational diabetes mellitus in pregnancy, unspecified control: Secondary | ICD-10-CM

## 2015-09-01 LAB — POCT URINALYSIS DIP (DEVICE)
GLUCOSE, UA: NEGATIVE mg/dL
HGB URINE DIPSTICK: NEGATIVE
Ketones, ur: 40 mg/dL — AB
Nitrite: NEGATIVE
PH: 6.5 (ref 5.0–8.0)
PROTEIN: NEGATIVE mg/dL
SPECIFIC GRAVITY, URINE: 1.025 (ref 1.005–1.030)
UROBILINOGEN UA: 0.2 mg/dL (ref 0.0–1.0)

## 2015-09-01 NOTE — Progress Notes (Signed)
Good BS control 90% in range on diet alone  Subjective:  Alexandra Hardy is a 24 y.o. G1P0 at [redacted]w[redacted]d being seen today for ongoing prenatal care.  Patient reports no complaints.  Contractions: Not present.  Vag. Bleeding: None. Movement: Present. Denies leaking of fluid.   The following portions of the patient's history were reviewed and updated as appropriate: allergies, current medications, past family history, past medical history, past social history, past surgical history and problem list.   Objective:   Filed Vitals:   09/01/15 1024  BP: 99/72  Pulse: 71  Temp: 97.5 F (36.4 C)  Weight: 139 lb 12.8 oz (63.413 kg)    Fetal Status: Fetal Heart Rate (bpm): 136   Movement: Present     General:  Alert, oriented and cooperative. Patient is in no acute distress.  Skin: Skin is warm and dry. No rash noted.   Cardiovascular: Normal heart rate noted  Respiratory: Normal respiratory effort, no problems with respiration noted  Abdomen: Soft, gravid, appropriate for gestational age. Pain/Pressure: Present     Pelvic: Vag. Bleeding: None Vag D/C Character: White   Cervical exam deferred        Extremities: Normal range of motion.  Edema: Trace  Mental Status: Normal mood and affect. Normal behavior. Normal judgment and thought content.   Urinalysis: Urine Protein: Trace Urine Glucose: Negative  Assessment and Plan:  Pregnancy: G1P0 at [redacted]w[redacted]d  1. Supervision of high-risk pregnancy, third trimester Gest DM diet controlled  2. Gestational diabetes mellitus, antepartum   Preterm labor symptoms and general obstetric precautions including but not limited to vaginal bleeding, contractions, leaking of fluid and fetal movement were reviewed in detail with the patient. Please refer to After Visit Summary for other counseling recommendations.  Return in about 1 week (around 09/08/2015). NST if not diet controlled  Adam Phenix, MD

## 2015-09-01 NOTE — Progress Notes (Signed)
Used interpreter Carol Hernandez .  

## 2015-09-01 NOTE — Patient Instructions (Signed)
Diabetes mellitus gestacional (Gestational Diabetes Mellitus) La diabetes mellitus gestacional, ms comnmente conocida como diabetes gestacional es un tipo de diabetes que desarrollan algunas mujeres durante el embarazo. En la diabetes gestacional, el pncreas no produce suficiente insulina (una hormona) o las clulas son menos sensibles a la insulina producida (resistencia a la insulina), o ambas cosas. Normalmente, la insulina mueve los azcares de los alimentos a las clulas de los tejidos. Las clulas de los tejidos utilizan los azcares para obtener energa. La falta de insulina o la falta de una respuesta normal a la insulina hace que el exceso de azcar se acumule en la sangre en lugar de penetrar en las clulas de los tejidos. Como resultado, se producen niveles altos de azcar en la sangre (hiperglucemia). El efecto de los niveles altos de azcar (glucosa) puede causar muchos problemas.  FACTORES DE RIESGO Usted tiene mayor probabilidad de desarrollar diabetes gestacional si tiene antecedentes familiares de diabetes y tambin si tiene uno o ms de los siguientes factores de riesgo:  ndice de masa corporal superior a 30 (obesidad).  Embarazo previo con diabetes gestacional.  La edad avanzada en el momento del embarazo. Si se mantienen los niveles de glucosa en la sangre en un rango normal durante el embarazo, las mujeres pueden tener un embarazo saludable. Si los niveles de glucosa en la sangre no estn bien controlados, puede haber riesgos para usted, el feto o el recin nacido, o durante el trabajo de parto y el parto.  SNTOMAS  Si se presentan sntomas, stos son similares a los sntomas que normalmente experimentar durante el embarazo. Los sntomas de la diabetes gestacional son:   Aumento de la sed (polidipsia).  Aumento de la miccin (poliuria).  Orina con ms frecuencia durante la noche (nocturia).  Prdida de peso. La prdida de peso puede ser muy rpida.  Infecciones  frecuentes y recurrentes.  Cansancio (fatiga).  Debilidad.  Cambios en la visin, como visin borrosa.  Olor a fruta en el aliento.  Dolor abdominal. DIAGNSTICO La diabetes se diagnostica cuando hay aumento de los niveles de glucosa en la sangre. El nivel de glucosa en la sangre puede controlarse en uno o ms de los siguientes anlisis de sangre:  Medicin de glucosa en la sangre en ayunas. No se le permitir comer durante al menos 8 horas antes de que se tome una muestra de sangre.  Pruebas al azar de glucosa en la sangre. El nivel de glucosa en la sangre se controla en cualquier momento del da sin importar el momento en que haya comido.  Prueba de tolerancia a la glucosa oral (PTGO). La glucosa en la sangre se mide despus de no haber comido (ayunas) durante una a tres horas y despus de beber una bebida que contenga glucosa. Dado que las hormonas que causan la resistencia a la insulina son ms altas alrededor de las semanas 24 a 28 de embarazo, generalmente se realiza una PTGO durante ese tiempo. Si tiene factores de riesgo, en la primera visita prenatal pueden hacerle pruebas de deteccin de diabetes tipo 2 no diagnosticada. TRATAMIENTO  La diabetes gestacional debe controlarse en primer lugar con dieta y ejercicios. Pueden agregarse medicamentos, pero solo si son necesarios.  Usted tendr que tomar medicamentos para la diabetes o insulina diariamente para mantener los niveles de glucosa en la sangre en el rango deseado.  Usted tendr que combinar la dosis de insulina con la actividad fsica y la eleccin de alimentos saludables. Si tiene diabetes gestacional, el objetivo del tratamiento ser   mantener los siguientes niveles sanguneos de glucosa:  Antes de las comidas (preprandial): valor de 95 mg/dl o inferior.  Despus de las comidas (posprandial):  Una hora despus de la comida: valor de 140 mg/dl o inferior.  Dos horas despus de la comida: valor de 120 mg/dl o  inferior. Si tiene diabetes tipo 1 o tipo 2 preexistente, el objetivo del tratamiento ser mantener los siguientes niveles sanguneos de glucosa:  Antes de las comidas, a la hora de acostarse y durante la noche: de 60 a 99 mg/dl.  Despus de las comidas: valor mximo de 100 a 129 mg/dl. INSTRUCCIONES PARA EL CUIDADO EN EL HOGAR   Controle su nivel de hemoglobina A1c dos veces al ao.  Contrlese a diario el nivel de glucosa en la sangre segn las indicaciones de su mdico. Es comn realizar controles frecuentes de la glucosa en la sangre.  Supervise las cetonas en la orina cuando est enferma y segn las indicaciones de su mdico.  Tome el medicamento para la diabetes y adminstrese insulina segn las indicaciones de su mdico para mantener el nivel de glucosa en la sangre en el rango deseado.  Nunca se quede sin medicamento para la diabetes o sin insulina. Es necesario que la reciba todos los das.  Ajuste la insulina segn la ingesta de hidratos de carbono. Los hidratos de carbono pueden aumentar los niveles de glucosa en la sangre, pero deben incluirse en su dieta. Los hidratos de carbono aportan vitaminas, minerales y fibra que son una parte esencial de una dieta saludable. Los hidratos de carbono se encuentran en frutas, verduras, cereales integrales, productos lcteos, legumbres y alimentos que contienen azcares aadidos.  Consuma alimentos saludables. Alterne 3 comidas con 3 colaciones.  Aumente de peso saludablemente. El aumento del peso total vara de acuerdo con el ndice de masa corporal que tena antes del embarazo ([facility]).  Lleve una tarjeta de alerta mdica o use una pulsera o medalla de alerta mdica.  Lleve con usted una colacin de 15gramos de hidratos de carbono en todo momento para controlar los niveles bajos de glucosa en la sangre (hipoglucemia). Algunos ejemplos de colaciones de 15gramos de hidratos de carbono son los siguientes:  Tabletas de glucosa, 3 o 4.  Gel  de glucosa, tubo de 15 gramos.  Pasas de uva, 2 cucharadas (24 g).  Caramelos de goma, 6.  Galletas de animales, 8.  Jugo de fruta, gaseosa comn, o leche descremada, 4 onzas (120 ml).  Pastillas de goma, 9.  Reconocer la hipoglucemia. Durante el embarazo la hipoglucemia se produce cuando hay niveles de glucosa en la sangre de 60 mg/dl o menos. El riesgo de hipoglucemia aumenta durante el ayuno o cuando se saltea las comidas, durante o despus de realizar ejercicio intenso y mientras duerme. Los sntomas de hipoglucemia son:  Temblores o sacudidas.  Disminucin de la capacidad de concentracin.  Sudoracin.  Aumento de la frecuencia cardaca.  Dolor de cabeza.  Sequedad en la boca.  Hambre.  Irritabilidad.  Ansiedad.  Sueo agitado.  Alteracin del habla o de la coordinacin.  Confusin.  Tratar la hipoglucemia rpidamente. Si usted est alerta y puede tragar con seguridad, siga la regla de 15/15 que consiste en:  Tome entre 15 y 20gramos de glucosa de accin rpida o carbohidratos. Las opciones de accin rpida son un gel de glucosa, tabletas de glucosa, o 4 onzas (120 ml) de jugo de frutas, gaseosa comn, o leche baja en grasa.  Compruebe su nivel de glucosa en la sangre   15 minutos despus de tomar la glucosa.  Tome entre 15 y 20 gramos ms de glucosa si el nivel de glucosa en la sangre todava es de 70mg/dl o inferior.  Ingiera una comida o una colacin en el lapso de 1 hora una vez que los niveles de glucosa en la sangre vuelven a la normalidad.  Est atento a la poliuria (miccin excesiva) y la polidipsia (sensacin de mucha sed), que son los primeros signos de la hiperglucemia. El reconocimiento temprano de la hiperglucemia permite un tratamiento oportuno. Trate la hiperglucemia segn le indic su mdico.  Haga actividad fsica por lo menos 30minutos al da o como lo indique su mdico. Se recomienda que 30 minutos despus de cada comida, realice diez minutos  de actividad fsica para controlar los niveles de glucosa postprandial en la sangre.  Ajuste su dosis de insulina y la ingesta de alimentos, segn sea necesario, si inicia un nuevo ejercicio o deporte.  Siga su plan para los das de enfermedad cuando no pueda comer o beber como de costumbre.  Evite el tabaco y el alcohol.  Concurra a todas las visitas de control como se lo haya indicado el mdico.  Siga el consejo del mdico respecto a los controles prenatales y posteriores al parto (postparto), las visitas, la planificacin de las comidas, el ejercicio, los medicamentos, las vitaminas, los anlisis de sangre, otras pruebas mdicas y actividades fsicas.  Realice diariamente el cuidado de la piel y de los pies. Examine su piel y los pies diariamente para ver si tiene cortes, moretones, enrojecimiento, problemas en las uas, sangrado, ampollas o llagas.  Cepllese los dientes y encas por lo menos dos veces al da y use hilo dental al menos una vez por da. Concurra regularmente a las visitas de control con el dentista.  Programe un examen de vista durante el primer trimestre de su embarazo o como lo indique su mdico.  Comparta su plan de control de diabetes en el trabajo o en la escuela.  Mantngase al da con las vacunas.  Aprenda a manejar el estrs.  Obtenga la mayor cantidad posible de informacin sobre la diabetes y solicite ayuda siempre que sea necesario.  Obtenga informacin sobre el amamantamiento y analice esta posibilidad.  Debe controlar el nivel de azcar en la sangre de 6a 12semanas despus del parto. Esto se hace con una prueba de tolerancia a la glucosa oral (PTGO). SOLICITE ATENCIN MDICA SI:   No puede comer alimentos o beber por ms de 6 horas.  Tuvo nuseas o ha vomitado durante ms de 6 horas.  Tiene un nivel de glucosa en la sangre de 200 mg/dl y cetonas en la orina.  Presenta algn cambio en el estado mental.  Desarrolla problemas de visin.  Sufre  un dolor persistente de cabeza.  Siente dolor o molestias en la parte superior del abdomen.  Desarrolla una enfermedad grave adicional.  Tuvo diarrea durante ms de 6 horas.  Ha estado enfermo o ha tenido fiebre durante un par de das y no mejora. SOLICITE ATENCIN MDICA DE INMEDIATO SI:   Tiene dificultad para respirar.  Ya no siente los movimientos del beb.  Est sangrando o tiene flujo vaginal.  Comienza a tener contracciones o trabajo de parto prematuro. ASEGRESE DE QUE:  Comprende estas instrucciones.  Controlar su afeccin.  Recibir ayuda de inmediato si no mejora o si empeora.   Esta informacin no tiene como fin reemplazar el consejo del mdico. Asegrese de hacerle al mdico cualquier pregunta que tenga.     Document Released: 08/18/2005 Document Revised: 11/29/2014 Elsevier Interactive Patient Education 2016 [employer]  

## 2015-09-08 ENCOUNTER — Ambulatory Visit (INDEPENDENT_AMBULATORY_CARE_PROVIDER_SITE_OTHER): Payer: Self-pay | Admitting: Obstetrics & Gynecology

## 2015-09-08 VITALS — BP 93/62 | HR 69 | Temp 97.9°F | Wt 141.1 lb

## 2015-09-08 DIAGNOSIS — O24415 Gestational diabetes mellitus in pregnancy, controlled by oral hypoglycemic drugs: Secondary | ICD-10-CM

## 2015-09-08 DIAGNOSIS — O0993 Supervision of high risk pregnancy, unspecified, third trimester: Secondary | ICD-10-CM

## 2015-09-08 LAB — POCT URINALYSIS DIP (DEVICE)
Bilirubin Urine: NEGATIVE
Glucose, UA: NEGATIVE mg/dL
KETONES UR: NEGATIVE mg/dL
NITRITE: NEGATIVE
PH: 6.5 (ref 5.0–8.0)
PROTEIN: NEGATIVE mg/dL
Specific Gravity, Urine: 1.025 (ref 1.005–1.030)
Urobilinogen, UA: 0.2 mg/dL (ref 0.0–1.0)

## 2015-09-08 LAB — GLUCOSE, CAPILLARY: GLUCOSE-CAPILLARY: 75 mg/dL (ref 65–99)

## 2015-09-08 MED ORDER — GLYBURIDE 1.25 MG PO TABS: ORAL_TABLET | ORAL | Status: DC

## 2015-09-08 NOTE — Progress Notes (Signed)
Subjective:  Alexandra Hardy is a 24 y.o. G1P0 at 431w6d being seen today for ongoing prenatal care.  Patient reports no complaints.  Contractions: Not present.  Vag. Bleeding: None. Movement: Present. Denies leaking of fluid.   The following portions of the patient's history were reviewed and updated as appropriate: allergies, current medications, past family history, past medical history, past social history, past surgical history and problem list. Problem list updated.  Objective:   Filed Vitals:   09/08/15 0923  BP: 93/62  Pulse: 69  Temp: 97.9 F (36.6 C)  Weight: 141 lb 1.6 oz (64.003 kg)    Fetal Status: Fetal Heart Rate (bpm): 128   Movement: Present     General:  Alert, oriented and cooperative. Patient is in no acute distress.  Skin: Skin is warm and dry. No rash noted.   Cardiovascular: Normal heart rate noted  Respiratory: Normal respiratory effort, no problems with respiration noted  Abdomen: Soft, gravid, appropriate for gestational age. Pain/Pressure: Present     Pelvic: Vag. Bleeding: None     Cervical exam deferred        Extremities: Normal range of motion.     Mental Status: Normal mood and affect. Normal behavior. Normal judgment and thought content.   Urinalysis: Urine Protein: Negative Urine Glucose: Negative  Assessment and Plan:  Pregnancy: G1P0 at 801w6d  1. Gestational diabetes mellitus treated with oral hypoglycemic therapy -all dinner values are elevated.  Start glyburide 1.25 mg at lunch.  This should help her dinner values and keep her from hypoglycemia in the morning. - POCT CBG (Fasting - Glucose) - Fetal nonstress test  2. Supervision of high-risk pregnancy, third trimester 2x week testing; induction at 39 weeks.  Term labor symptoms and general obstetric precautions including but not limited to vaginal bleeding, contractions, leaking of fluid and fetal movement were reviewed in detail with the patient. Please refer to After Visit  Summary for other counseling recommendations.  Return in about 1 week (around 09/15/2015).   Lesly DukesKelly H Shyia Fillingim, MD

## 2015-09-09 ENCOUNTER — Ambulatory Visit (INDEPENDENT_AMBULATORY_CARE_PROVIDER_SITE_OTHER): Payer: Self-pay | Admitting: *Deleted

## 2015-09-09 VITALS — BP 91/56 | HR 76

## 2015-09-09 DIAGNOSIS — O24415 Gestational diabetes mellitus in pregnancy, controlled by oral hypoglycemic drugs: Secondary | ICD-10-CM

## 2015-09-09 DIAGNOSIS — O24419 Gestational diabetes mellitus in pregnancy, unspecified control: Secondary | ICD-10-CM

## 2015-09-09 NOTE — Progress Notes (Signed)
10/18 NST reviewed and reactive

## 2015-09-09 NOTE — Addendum Note (Signed)
Addended by: Jill SideAY, Aasia Peavler L on: 09/09/2015 09:09 AM   Modules accepted: Orders

## 2015-09-09 NOTE — Progress Notes (Signed)
Interpreter Amanda PeaAlvi Almonte present for encounter.   US for growth scheduled 11/4 @ 1030.

## 2015-09-12 ENCOUNTER — Ambulatory Visit (INDEPENDENT_AMBULATORY_CARE_PROVIDER_SITE_OTHER): Payer: Self-pay | Admitting: *Deleted

## 2015-09-12 VITALS — BP 96/59 | HR 73

## 2015-09-12 DIAGNOSIS — O24419 Gestational diabetes mellitus in pregnancy, unspecified control: Secondary | ICD-10-CM

## 2015-09-12 NOTE — Progress Notes (Signed)
10/21 NST reviewed and reactive 

## 2015-09-12 NOTE — Progress Notes (Signed)
Interpreter Albertina SenegalMarly Adams present for encounter.  Discussed pt's diet extensively as she stated that she had a headache after starting the Glyburide. She does not eat breakfast because she is still sleeping - first meal of the day is 1100-1200. She also has not been having a bedtime snack.  Dietary recommendations given and will review @ next appt on 10/24. Pt and husband voiced understanding.

## 2015-09-15 ENCOUNTER — Encounter: Payer: Self-pay | Admitting: Obstetrics and Gynecology

## 2015-09-15 ENCOUNTER — Ambulatory Visit (INDEPENDENT_AMBULATORY_CARE_PROVIDER_SITE_OTHER): Payer: Self-pay | Admitting: Obstetrics and Gynecology

## 2015-09-15 VITALS — BP 90/57 | HR 80 | Temp 98.3°F | Wt 140.9 lb

## 2015-09-15 DIAGNOSIS — O24419 Gestational diabetes mellitus in pregnancy, unspecified control: Secondary | ICD-10-CM

## 2015-09-15 DIAGNOSIS — O0993 Supervision of high risk pregnancy, unspecified, third trimester: Secondary | ICD-10-CM

## 2015-09-15 DIAGNOSIS — Z789 Other specified health status: Secondary | ICD-10-CM

## 2015-09-15 DIAGNOSIS — Z113 Encounter for screening for infections with a predominantly sexual mode of transmission: Secondary | ICD-10-CM

## 2015-09-15 LAB — POCT URINALYSIS DIP (DEVICE)
Bilirubin Urine: NEGATIVE
Glucose, UA: NEGATIVE mg/dL
Hgb urine dipstick: NEGATIVE
KETONES UR: NEGATIVE mg/dL
Nitrite: NEGATIVE
PH: 6 (ref 5.0–8.0)
PROTEIN: NEGATIVE mg/dL
SPECIFIC GRAVITY, URINE: 1.02 (ref 1.005–1.030)
Urobilinogen, UA: 0.2 mg/dL (ref 0.0–1.0)

## 2015-09-15 LAB — OB RESULTS CONSOLE GBS: GBS: NEGATIVE

## 2015-09-15 LAB — OB RESULTS CONSOLE GC/CHLAMYDIA: GC PROBE AMP, GENITAL: NEGATIVE

## 2015-09-15 NOTE — Addendum Note (Signed)
Addended by: Garret ReddishBARNES, Yanette Tripoli M on: 09/15/2015 04:14 PM   Modules accepted: Orders

## 2015-09-15 NOTE — Progress Notes (Signed)
Subjective:  Alexandra AlleyMaria Gonzalez Zepahua is a 24 y.o. G1P0 at 3114w6d being seen today for ongoing prenatal care.  Patient reports no complaints.  Contractions: Irregular.  Vag. Bleeding: None. Movement: Present. Denies leaking of fluid.   The following portions of the patient's history were reviewed and updated as appropriate: allergies, current medications, past family history, past medical history, past social history, past surgical history and problem list. Problem list updated.  Objective:   Filed Vitals:   09/15/15 1118  BP: 90/57  Pulse: 80  Temp: 98.3 F (36.8 C)  Weight: 140 lb 14.4 oz (63.912 kg)    Fetal Status: Fetal Heart Rate (bpm): NST Fundal Height: 36 cm Movement: Present     General:  Alert, oriented and cooperative. Patient is in no acute distress.  Skin: Skin is warm and dry. No rash noted.   Cardiovascular: Normal heart rate noted  Respiratory: Normal respiratory effort, no problems with respiration noted  Abdomen: Soft, gravid, appropriate for gestational age. Pain/Pressure: Present     Pelvic: Vag. Bleeding: None Vag D/C Character: White   Cervical exam performed      closed, thick, long, post  Extremities: Normal range of motion.  Edema: Trace  Mental Status: Normal mood and affect. Normal behavior. Normal judgment and thought content.   Urinalysis: Urine Protein: Negative Urine Glucose: Negative  Assessment and Plan:  Pregnancy: G1P0 at 3014w6d  1. Supervision of high-risk pregnancy, third trimester Patient is doing well without complaints   2. Gestational diabetes mellitus, antepartum CBGs reviewed and majority within range a few elevated pp as high as 160. Patient and her husband admit to overeating. Discussed consuming snacks between meals to help Continue daily glyburide Follow up growth ultrasound on 10/28 Cultures collected NST today reviewed and reactive  3. Language barrier affecting health care Spanish interpreter present  Term labor symptoms and  general obstetric precautions including but not limited to vaginal bleeding, contractions, leaking of fluid and fetal movement were reviewed in detail with the patient. Please refer to After Visit Summary for other counseling recommendations.  Return in about 1 week (around 09/22/2015).   Catalina AntiguaPeggy Larkin Alfred, MD

## 2015-09-15 NOTE — Progress Notes (Signed)
Used interpreter MariaElena Jiminez. 

## 2015-09-16 LAB — GC/CHLAMYDIA PROBE AMP (~~LOC~~) NOT AT ARMC
CHLAMYDIA, DNA PROBE: NEGATIVE
Neisseria Gonorrhea: NEGATIVE

## 2015-09-17 LAB — CULTURE, BETA STREP (GROUP B ONLY)

## 2015-09-19 ENCOUNTER — Ambulatory Visit (INDEPENDENT_AMBULATORY_CARE_PROVIDER_SITE_OTHER): Payer: Self-pay | Admitting: *Deleted

## 2015-09-19 VITALS — BP 97/59 | HR 86

## 2015-09-19 DIAGNOSIS — O24419 Gestational diabetes mellitus in pregnancy, unspecified control: Secondary | ICD-10-CM

## 2015-09-19 NOTE — Progress Notes (Signed)
09/19/2015 NST reviewed and reactive 

## 2015-09-19 NOTE — Progress Notes (Signed)
Interpreter Raquel Mora present for encounter.  

## 2015-09-22 ENCOUNTER — Ambulatory Visit (INDEPENDENT_AMBULATORY_CARE_PROVIDER_SITE_OTHER): Payer: Self-pay | Admitting: Obstetrics & Gynecology

## 2015-09-22 VITALS — BP 103/57 | HR 72 | Wt 141.4 lb

## 2015-09-22 DIAGNOSIS — O24419 Gestational diabetes mellitus in pregnancy, unspecified control: Secondary | ICD-10-CM

## 2015-09-22 LAB — POCT URINALYSIS DIP (DEVICE)
Bilirubin Urine: NEGATIVE
GLUCOSE, UA: NEGATIVE mg/dL
HGB URINE DIPSTICK: NEGATIVE
Ketones, ur: NEGATIVE mg/dL
NITRITE: NEGATIVE
PROTEIN: NEGATIVE mg/dL
SPECIFIC GRAVITY, URINE: 1.025 (ref 1.005–1.030)
UROBILINOGEN UA: 0.2 mg/dL (ref 0.0–1.0)
pH: 6 (ref 5.0–8.0)

## 2015-09-22 NOTE — Progress Notes (Signed)
IOL 09/30/15 @ 1145p.

## 2015-09-22 NOTE — Progress Notes (Signed)
Need IOL 39 weeks Subjective:  Alexandra AlleyMaria Gonzalez Hardy is a 24 y.o. G1P0 at [redacted]w[redacted]d being seen today for ongoing prenatal care.  Patient reports no complaints.  Contractions: Irregular.  Vag. Bleeding: None. Movement: Present. Denies leaking of fluid.   The following portions of the patient's history were reviewed and updated as appropriate: allergies, current medications, past family history, past medical history, past social history, past surgical history and problem list. Problem list updated.  Objective:   Filed Vitals:   09/22/15 0908  BP: 103/57  Pulse: 72  Weight: 141 lb 6.4 oz (64.139 kg)    Fetal Status: Fetal Heart Rate (bpm): NST   Movement: Present     General:  Alert, oriented and cooperative. Patient is in no acute distress.  Skin: Skin is warm and dry. No rash noted.   Cardiovascular: Normal heart rate noted  Respiratory: Normal respiratory effort, no problems with respiration noted  Abdomen: Soft, gravid, appropriate for gestational age. Pain/Pressure: Present     Pelvic: Vag. Bleeding: None     Cervical exam deferred        Extremities: Normal range of motion.  Edema: None  Mental Status: Normal mood and affect. Normal behavior. Normal judgment and thought content.   Urinalysis: Urine Protein: Negative Urine Glucose: Negative  Assessment and Plan:  Pregnancy: G1P0 at [redacted]w[redacted]d  1. Gestational diabetes mellitus, antepartum FBS good, pp occasional 140-160 but <75% in range NST reactive Preterm labor symptoms and general obstetric precautions including but not limited to vaginal bleeding, contractions, leaking of fluid and fetal movement were reviewed in detail with the patient. Please refer to After Visit Summary for other counseling recommendations.  Return in about 4 days (around 09/26/2015) for 2x/wk as scheduled.   Adam PhenixJames G Dianna Deshler, MD

## 2015-09-22 NOTE — Progress Notes (Signed)
Interpreter Elna Breslowarol Hernandez present for encounter.  Breastfeeding tip of the week reviewed. US for growth done 10/28.

## 2015-09-25 ENCOUNTER — Telehealth (HOSPITAL_COMMUNITY): Payer: Self-pay | Admitting: *Deleted

## 2015-09-25 NOTE — Telephone Encounter (Signed)
Preadmission screen Interpreter number [phone]

## 2015-09-26 ENCOUNTER — Ambulatory Visit (INDEPENDENT_AMBULATORY_CARE_PROVIDER_SITE_OTHER): Payer: Self-pay | Admitting: *Deleted

## 2015-09-26 ENCOUNTER — Other Ambulatory Visit: Payer: Self-pay | Admitting: Obstetrics and Gynecology

## 2015-09-26 ENCOUNTER — Ambulatory Visit (HOSPITAL_COMMUNITY)
Admission: RE | Admit: 2015-09-26 | Discharge: 2015-09-26 | Disposition: A | Discharge status: Home / Self Care | Payer: Self-pay | Source: Ambulatory Visit | Attending: Obstetrics and Gynecology | Admitting: Obstetrics and Gynecology

## 2015-09-26 VITALS — BP 100/61 | HR 72

## 2015-09-26 DIAGNOSIS — Z3A38 38 weeks gestation of pregnancy: Secondary | ICD-10-CM | POA: Insufficient documentation

## 2015-09-26 DIAGNOSIS — O24419 Gestational diabetes mellitus in pregnancy, unspecified control: Secondary | ICD-10-CM

## 2015-09-26 DIAGNOSIS — O24415 Gestational diabetes mellitus in pregnancy, controlled by oral hypoglycemic drugs: Secondary | ICD-10-CM | POA: Insufficient documentation

## 2015-09-26 NOTE — Progress Notes (Signed)
US for growth today.  Interpreter Amanda PeaAlvi Almonte present for encounter. Labor sx reviewed.

## 2015-09-28 ENCOUNTER — Inpatient Hospital Stay (HOSPITAL_COMMUNITY)
Admission: AD | Admit: 2015-09-28 | Discharge: 2015-09-30 | DRG: 774 | Disposition: A | Discharge status: Home / Self Care | Payer: Medicaid Other | Source: Ambulatory Visit | Attending: Obstetrics & Gynecology | Admitting: Obstetrics & Gynecology

## 2015-09-28 ENCOUNTER — Encounter (HOSPITAL_COMMUNITY): Payer: Self-pay | Admitting: *Deleted

## 2015-09-28 ENCOUNTER — Inpatient Hospital Stay (HOSPITAL_COMMUNITY): Payer: Medicaid Other | Admitting: Anesthesiology

## 2015-09-28 DIAGNOSIS — O4292 Full-term premature rupture of membranes, unspecified as to length of time between rupture and onset of labor: Principal | ICD-10-CM | POA: Diagnosis present

## 2015-09-28 DIAGNOSIS — O24414 Gestational diabetes mellitus in pregnancy, insulin controlled: Secondary | ICD-10-CM | POA: Diagnosis present

## 2015-09-28 DIAGNOSIS — O24415 Gestational diabetes mellitus in pregnancy, controlled by oral hypoglycemic drugs: Secondary | ICD-10-CM | POA: Diagnosis present

## 2015-09-28 DIAGNOSIS — O4290 Premature rupture of membranes, unspecified as to length of time between rupture and onset of labor, unspecified weeks of gestation: Secondary | ICD-10-CM

## 2015-09-28 DIAGNOSIS — Z3A38 38 weeks gestation of pregnancy: Secondary | ICD-10-CM

## 2015-09-28 DIAGNOSIS — IMO0001 Reserved for inherently not codable concepts without codable children: Secondary | ICD-10-CM

## 2015-09-28 HISTORY — DX: Personal history of other infectious and parasitic diseases: Z86.19

## 2015-09-28 HISTORY — DX: Gestational diabetes mellitus in pregnancy, unspecified control: O24.419

## 2015-09-28 LAB — CBC
HEMATOCRIT: 39.6 % (ref 36.0–46.0)
HEMOGLOBIN: 13.6 g/dL (ref 12.0–15.0)
MCH: 32.6 pg (ref 26.0–34.0)
MCHC: 34.3 g/dL (ref 30.0–36.0)
MCV: 95 fL (ref 78.0–100.0)
Platelets: 148 10*3/uL — ABNORMAL LOW (ref 150–400)
RBC: 4.17 MIL/uL (ref 3.87–5.11)
RDW: 13.9 % (ref 11.5–15.5)
WBC: 5.7 10*3/uL (ref 4.0–10.5)

## 2015-09-28 LAB — GLUCOSE, CAPILLARY
GLUCOSE-CAPILLARY: 106 mg/dL — AB (ref 65–99)
Glucose-Capillary: 82 mg/dL (ref 65–99)
Glucose-Capillary: 54 mg/dL — ABNORMAL LOW (ref 65–99)
Glucose-Capillary: 101 mg/dL — ABNORMAL HIGH (ref 65–99)

## 2015-09-28 LAB — TYPE AND SCREEN
ABO/RH(D): O POS
ANTIBODY SCREEN: NEGATIVE

## 2015-09-28 LAB — POCT FERN TEST: POCT FERN TEST: NEGATIVE

## 2015-09-28 LAB — ABO/RH: ABO/RH(D): O POS

## 2015-09-28 MED ORDER — LIDOCAINE-EPINEPHRINE (PF) 2 %-1:200000 IJ SOLN
INTRAMUSCULAR | Status: DC | PRN
  Administered 2015-09-28: 4 mL

## 2015-09-28 MED ORDER — OXYCODONE-ACETAMINOPHEN 5-325 MG PO TABS: 1.0000 | ORAL_TABLET | ORAL | Status: DC | PRN

## 2015-09-28 MED ORDER — OXYTOCIN BOLUS FROM INFUSION: 500.0000 mL | INTRAVENOUS | Status: DC

## 2015-09-28 MED ORDER — EPHEDRINE 5 MG/ML INJ
10.0000 mg | INTRAVENOUS | Status: DC | PRN
  Filled 2015-09-28: qty 2

## 2015-09-28 MED ORDER — OXYCODONE-ACETAMINOPHEN 5-325 MG PO TABS: 2.0000 | ORAL_TABLET | ORAL | Status: DC | PRN

## 2015-09-28 MED ORDER — LACTATED RINGERS IV SOLN
INTRAVENOUS | Status: DC
  Administered 2015-09-28 (×2): via INTRAVENOUS

## 2015-09-28 MED ORDER — TERBUTALINE SULFATE 1 MG/ML IJ SOLN
0.2500 mg | Freq: Once | INTRAMUSCULAR | Status: DC | PRN
  Filled 2015-09-28: qty 1

## 2015-09-28 MED ORDER — OXYTOCIN 40 UNITS IN LACTATED RINGERS INFUSION - SIMPLE MED
1.0000 m[IU]/min | INTRAVENOUS | Status: DC
  Administered 2015-09-28: 8 m[IU]/min via INTRAVENOUS
  Administered 2015-09-28: 2 m[IU]/min via INTRAVENOUS
  Filled 2015-09-28: qty 1000

## 2015-09-28 MED ORDER — DIPHENHYDRAMINE HCL 50 MG/ML IJ SOLN: 12.5000 mg | INTRAMUSCULAR | Status: DC | PRN

## 2015-09-28 MED ORDER — PHENYLEPHRINE 40 MCG/ML (10ML) SYRINGE FOR IV PUSH (FOR BLOOD PRESSURE SUPPORT)
80.0000 ug | PREFILLED_SYRINGE | INTRAVENOUS | Status: DC | PRN
  Filled 2015-09-28: qty 20
  Filled 2015-09-28: qty 2

## 2015-09-28 MED ORDER — LIDOCAINE HCL (PF) 1 % IJ SOLN
30.0000 mL | INTRAMUSCULAR | Status: DC | PRN
  Filled 2015-09-28: qty 30

## 2015-09-28 MED ORDER — AMPICILLIN SODIUM 2 G IJ SOLR
2.0000 g | Freq: Once | INTRAMUSCULAR | Status: DC
  Filled 2015-09-28: qty 2000

## 2015-09-28 MED ORDER — LACTATED RINGERS IV SOLN: INTRAVENOUS | Status: DC

## 2015-09-28 MED ORDER — LACTATED RINGERS IV SOLN: 500.0000 mL | INTRAVENOUS | Status: DC | PRN

## 2015-09-28 MED ORDER — ONDANSETRON HCL 4 MG/2ML IJ SOLN: 4.0000 mg | Freq: Four times a day (QID) | INTRAMUSCULAR | Status: DC | PRN

## 2015-09-28 MED ORDER — FENTANYL CITRATE (PF) 100 MCG/2ML IJ SOLN
100.0000 ug | INTRAMUSCULAR | Status: DC | PRN
Start: 2015-09-28 — End: 2015-09-29
  Administered 2015-09-28 (×4): 100 ug via INTRAVENOUS
  Filled 2015-09-28 (×4): qty 2

## 2015-09-28 MED ORDER — BUPIVACAINE HCL (PF) 0.25 % IJ SOLN
INTRAMUSCULAR | Status: DC | PRN
  Administered 2015-09-28 (×2): 4 mL via EPIDURAL

## 2015-09-28 MED ORDER — ACETAMINOPHEN 325 MG PO TABS: 650.0000 mg | ORAL_TABLET | ORAL | Status: DC | PRN

## 2015-09-28 MED ORDER — OXYTOCIN 40 UNITS IN LACTATED RINGERS INFUSION - SIMPLE MED
62.5000 mL/h | INTRAVENOUS | Status: DC
  Administered 2015-09-29: 62.5 mL/h via INTRAVENOUS

## 2015-09-28 MED ORDER — FENTANYL 2.5 MCG/ML BUPIVACAINE 1/10 % EPIDURAL INFUSION (WH - ANES)
14.0000 mL/h | INTRAMUSCULAR | Status: DC | PRN
  Administered 2015-09-28 (×2): 14 mL/h via EPIDURAL
  Filled 2015-09-28: qty 125

## 2015-09-28 MED ORDER — CITRIC ACID-SODIUM CITRATE 334-500 MG/5ML PO SOLN: 30.0000 mL | ORAL | Status: DC | PRN

## 2015-09-28 NOTE — Anesthesia Preprocedure Evaluation (Signed)
Anesthesia Evaluation  Patient identified by MRN, date of birth, ID band Patient awake    Reviewed: Allergy & Precautions, Patient's Chart, lab work & pertinent test results  History of Anesthesia Complications Negative for: history of anesthetic complications  Airway Mallampati: II  TM Distance: >3 FB Neck ROM: Full    Dental  (+) Teeth Intact   Pulmonary neg pulmonary ROS,  breath sounds clear to auscultation        Cardiovascular negative cardio ROS  Rhythm:Regular     Neuro/Psych negative neurological ROS  negative psych ROS   GI/Hepatic negative GI ROS, Neg liver ROS,   Endo/Other  diabetes, Gestational  Renal/GU negative Renal ROS     Musculoskeletal   Abdominal   Peds  Hematology negative hematology ROS (+)   Anesthesia Other Findings   Reproductive/Obstetrics (+) Pregnancy                             Anesthesia Physical Anesthesia Plan  ASA: II  Anesthesia Plan: Epidural   Post-op Pain Management:    Induction:   Airway Management Planned:   Additional Equipment:   Intra-op Plan:   Post-operative Plan:   Informed Consent: I have reviewed the patients History and Physical, chart, labs and discussed the procedure including the risks, benefits and alternatives for the proposed anesthesia with the patient or authorized representative who has indicated his/her understanding and acceptance.   Dental advisory given  Plan Discussed with: Anesthesiologist  Anesthesia Plan Comments:         Anesthesia Quick Evaluation  

## 2015-09-28 NOTE — Anesthesia Procedure Notes (Signed)
Epidural Patient location during procedure: OB  Staffing Anesthesiologist: Berle Fitz Performed by: anesthesiologist   Preanesthetic Checklist Completed: patient identified, surgical consent, pre-op evaluation, timeout performed, IV checked, risks and benefits discussed and monitors and equipment checked  Epidural Patient position: sitting Prep: DuraPrep Patient monitoring: heart rate, cardiac monitor, continuous pulse ox and blood pressure Approach: midline Location: L5-S1 Injection technique: LOR saline  Needle:  Needle type: Tuohy  Needle gauge: 17 G Needle length: 9 cm Needle insertion depth: 6 cm Catheter type: closed end flexible Catheter size: 19 Gauge Catheter at skin depth: 12 cm Test dose: negative and 2% lidocaine with Epi 1:200 K  Assessment Events: blood not aspirated, injection not painful, no injection resistance, negative IV test and no paresthesia  Additional Notes Reason for block:procedure for pain

## 2015-09-28 NOTE — H&P (Signed)
Alexandra Hardy is a 24 y.o. female presenting for SROM at 0630 fluid is clear . Maternal Medical History:  Reason for admission: Rupture of membranes.   Contractions: Onset was 3-5 hours ago.   Frequency: irregular.   Perceived severity is mild.    Fetal activity: Perceived fetal activity is normal.   Last perceived fetal movement was within the past hour.    Prenatal complications: GDM  Prenatal Complications - Diabetes: gestational. Diabetes is managed by oral agent (monotherapy) and diet.      OB History    Gravida Para Term Preterm AB TAB SAB Ectopic Multiple Living   1              History reviewed. No pertinent past medical history. History reviewed. No pertinent past surgical history. Family History: family history is not on file. Social History:  reports that she has never smoked. She has never used smokeless tobacco. She reports that she does not drink alcohol or use illicit drugs.   Prenatal Transfer Tool  Maternal Diabetes: Yes:  Diabetes Type:  Insulin/Medication controlled, Diet controlled Genetic Screening: Normal Maternal Ultrasounds/Referrals: Normal Fetal Ultrasounds or other Referrals:  None Maternal Substance Abuse:  No Significant Maternal Medications:  None Significant Maternal Lab Results:  None Other Comments:  None  Review of Systems  Constitutional: Negative.   HENT: Negative.   Eyes: Negative.   Respiratory: Negative.   Cardiovascular: Negative.   Gastrointestinal: Positive for abdominal pain.  Genitourinary: Negative.   Musculoskeletal: Negative.   Skin: Negative.   Neurological: Negative.   Endo/Heme/Allergies: Negative.   Psychiatric/Behavioral: Negative.     Dilation: 2 Effacement (%): 90 Station: -1 Exam by:: Zerita BoersLawson, Darlene, CNM Blood pressure 101/74, pulse 69, temperature 97.9 F (36.6 C), temperature source Oral, resp. rate 18, last menstrual period 12/31/2014. Maternal Exam:  Uterine Assessment: Contraction  strength is mild.  Contraction frequency is regular.   Abdomen: Patient reports no abdominal tenderness. Fetal presentation: vertex  Introitus: Normal vulva. Normal vagina.  Amniotic fluid character: clear.  Pelvis: adequate for delivery.   Cervix: Cervix evaluated by digital exam.     Fetal Exam Fetal Monitor Review: Mode: ultrasound.   Variability: moderate (6-25 bpm).   Pattern: no decelerations.    Fetal State Assessment: Category I - tracings are normal.     Physical Exam  Constitutional: She is oriented to person, place, and time. She appears well-developed and well-nourished.  HENT:  Head: Normocephalic.  Eyes: Pupils are equal, round, and reactive to light.  Neck: Normal range of motion.  Cardiovascular: Normal rate, regular rhythm, normal heart sounds and intact distal pulses.   Respiratory: Effort normal and breath sounds normal.  GI: Soft. Bowel sounds are normal.  Genitourinary: Vagina normal and uterus normal.  Musculoskeletal: Normal range of motion.  Neurological: She is alert and oriented to person, place, and time. She has normal reflexes.  Skin: Skin is warm and dry.  Psychiatric: She has a normal mood and affect. Her behavior is normal. Judgment and thought content normal.    Prenatal labs: ABO, Rh: --/--/O POS (11/06 16100850) Antibody: PENDING (11/06 0850) Rubella: 4.45 (04/20 1424) RPR: NON REAC (09/02 1019)  HBsAg: NEGATIVE (04/20 1424)  HIV: NONREACTIVE (09/02 1019)  GBS: Negative (10/24 0000)   Assessment/Plan: SVE 2/80/-1, prodromal labor. GBS neg GDM on oral agents and diet.   Alexandra Hardy DARLENE 09/28/2015, 10:06 AM

## 2015-09-28 NOTE — Progress Notes (Signed)
Checked on patients needs.  °Spanish Interpreter  °

## 2015-09-28 NOTE — Progress Notes (Signed)
Checked on patient needs.  Patient requested pain medications.  Spanish Interpreter

## 2015-09-28 NOTE — MAU Provider Note (Signed)
Chief Complaint:  Rupture of Membranes   First Provider Initiated Contact with Patient 09/28/15 [phone]     HPI: Alexandra Hardy is a 24 y.o. G1P0 at 79w5dwho presents to maternity admissions reporting leakign clear and white liquid multiple times since 0630 this morning.   Associated signs and symptoms: Neg for fever, chills, abd tenderness. Pos for scant VB and mild contractions.   Good fetal movement.   Pregnancy Course: A2GDM on Glyburide  Past Medical History: History reviewed. No pertinent past medical history.  Past obstetric history: OB History  Gravida Para Term Preterm AB SAB TAB Ectopic Multiple Living  1             # Outcome Date GA Lbr Len/2nd Weight Sex Delivery Anes PTL Lv  1 Current               Past Surgical History: History reviewed. No pertinent past surgical history.   Family History: History reviewed. No pertinent family history.  Social History: Social History  Substance Use Topics  . Smoking status: Never Smoker   . Smokeless tobacco: Never Used  . Alcohol Use: No    Allergies: No Known Allergies  Meds:  Prescriptions prior to admission  Medication Sig Dispense Refill Last Dose  . ACCU-CHEK FASTCLIX LANCETS MISC 1 Units by Does not apply route 4 (four) times daily. 102 each 3 09/28/2015 at Unknown time  . Blood Glucose Monitoring Suppl (ACCU-CHEK NANO SMARTVIEW) W/DEVICE KIT 1 Device by Does not apply route 4 (four) times daily. 1 kit 0 09/28/2015 at Unknown time  . glucose blood (ACCU-CHEK SMARTVIEW) test strip Check CBG 4 times daily 51 each 12 09/28/2015 at Unknown time  . glyBURIDE (DIABETA) 1.25 MG tablet Take one tablet before lunch. 30 tablet 1 09/27/2015 at Unknown time  . Prenatal Vit-Fe Fumarate-FA (PRENATAL VITAMIN PO) Take by mouth.   09/27/2015 at Unknown time    I have reviewed patient's Past Medical Hx, Surgical Hx, Family Hx, Social Hx, medications and allergies.   ROS:  Review of Systems  Gastrointestinal: Positive for  abdominal pain (mild contractions).  Genitourinary: Positive for vaginal bleeding (scant).       LOF    Physical Exam  Patient Vitals for the past 24 hrs:  BP Temp Pulse Resp  09/28/15 0756 118/78 mmHg 97.6 F (36.4 C) 79 18   Constitutional: Well-developed, well-nourished female in no acute distress.  Cardiovascular: normal rate Respiratory: normal effort GI: Abd soft, non-tender, gravid appropriate for gestational age. MS: Extremities nontender, no edema, normal ROM Neurologic: Alert and oriented x 4.  Pelvic: NEFG,moderate amoutn of clear fluid mixed w/ mucus. Pos pooling. Fluid seen leaking from os. No blood, cervix clean.   Dilation: 1.5 Effacement (%): 70 Cervical Position: Middle Station: -2 Presentation: Vertex Exam by:: Ginger Morris RN  FHT:  Baseline 130 , moderate variability, accelerations present, no decelerations Contractions: irreg, mild   Labs: Results for orders placed or performed during the hospital encounter of 09/28/15 (from the past 24 hour(s))  Fern Test     Status: Normal   Collection Time: 09/28/15  8:22 AM  Result Value Ref Range   POCT Fern Test Negative = intact amniotic membranes    Repeat Fern Positive  Imaging:  NA  MAU Course: Sterile spec exam. Fern pos  MDM: 38.[redacted] weeks gestation SROM at 050 Not in labor.   Assessment: PROM  Plan: Admit to L&D  VManya Silvas CNM 09/28/2015 8:39 AM

## 2015-09-28 NOTE — Progress Notes (Signed)
Alexandra Hardy is a 24 y.o. G1P0 at 3326w5d by ultrasound admitted for rupture of membranes  Subjective:   Objective: BP 109/69 mmHg  Pulse 81  Temp(Src) 98 F (36.7 C) (Oral)  Resp 18  Ht 5\' 1"  (1.549 m)  Wt 142 lb (64.411 kg)  BMI 26.84 kg/m2  LMP 12/31/2014 (Exact Date)      FHT:  FHR: 130 bpm, variability: moderate,  accelerations:  Present,  decelerations:  Absent UC:   regular, every 3-4 minutes SVE:   Dilation: 2 Effacement (%): 90 Station: -1 Exam by:: Lajuana MatteJacobs, Dina RN  Labs: Lab Results  Component Value Date   WBC 5.7 09/28/2015   HGB 13.6 09/28/2015   HCT 39.6 09/28/2015   MCV 95.0 09/28/2015   PLT 148* 09/28/2015    Assessment / Plan: Augmentation of labor, progressing well  Labor: Progressing normally Preeclampsia:  no signs or symptoms of toxicity and intake and ouput balanced Fetal Wellbeing:  Category I Pain Control:  Fentanyl I/D:  n/a Anticipated MOD:  NSVD  LAWSON, MARIE DARLENE 09/28/2015, 2:50 PM

## 2015-09-28 NOTE — Progress Notes (Signed)
Checked on patients needs. Was present for a 1hr until patient was confortable.  Spanish Interpreter

## 2015-09-28 NOTE — Progress Notes (Signed)
Assisted RN and anaesthesia with interpretation of procedure for epidural.  Spanish interpreter

## 2015-09-28 NOTE — MAU Note (Signed)
Pt presents to MAU with complaints of rupture of membranes at 630 this morning. Small amount of bleeding noted

## 2015-09-29 ENCOUNTER — Other Ambulatory Visit: Payer: Self-pay

## 2015-09-29 ENCOUNTER — Encounter (HOSPITAL_COMMUNITY): Payer: Self-pay

## 2015-09-29 DIAGNOSIS — Z3A38 38 weeks gestation of pregnancy: Secondary | ICD-10-CM

## 2015-09-29 LAB — GLUCOSE, CAPILLARY
GLUCOSE-CAPILLARY: 131 mg/dL — AB (ref 65–99)
GLUCOSE-CAPILLARY: 126 mg/dL — AB (ref 65–99)

## 2015-09-29 LAB — RPR: RPR Ser Ql: NONREACTIVE

## 2015-09-29 MED ORDER — ZOLPIDEM TARTRATE 5 MG PO TABS: 5.0000 mg | ORAL_TABLET | Freq: Every evening | ORAL | Status: DC | PRN

## 2015-09-29 MED ORDER — ONDANSETRON HCL 4 MG PO TABS: 4.0000 mg | ORAL_TABLET | ORAL | Status: DC | PRN

## 2015-09-29 MED ORDER — OXYCODONE-ACETAMINOPHEN 5-325 MG PO TABS
1.0000 | ORAL_TABLET | ORAL | Status: DC | PRN
  Administered 2015-09-29: 1 via ORAL
  Filled 2015-09-29: qty 1

## 2015-09-29 MED ORDER — BENZOCAINE-MENTHOL 20-0.5 % EX AERO
1.0000 "application " | INHALATION_SPRAY | CUTANEOUS | Status: DC | PRN
  Administered 2015-09-29: 1 via TOPICAL
  Filled 2015-09-29: qty 56

## 2015-09-29 MED ORDER — ONDANSETRON HCL 4 MG/2ML IJ SOLN: 4.0000 mg | INTRAMUSCULAR | Status: DC | PRN

## 2015-09-29 MED ORDER — OXYCODONE-ACETAMINOPHEN 5-325 MG PO TABS: 2.0000 | ORAL_TABLET | ORAL | Status: DC | PRN

## 2015-09-29 MED ORDER — WITCH HAZEL-GLYCERIN EX PADS
1.0000 "application " | MEDICATED_PAD | CUTANEOUS | Status: DC | PRN
  Administered 2015-09-29: 1 via TOPICAL

## 2015-09-29 MED ORDER — DIBUCAINE 1 % RE OINT
1.0000 "application " | TOPICAL_OINTMENT | RECTAL | Status: DC | PRN
  Administered 2015-09-29: 1 via RECTAL
  Filled 2015-09-29: qty 28

## 2015-09-29 MED ORDER — DIPHENHYDRAMINE HCL 25 MG PO CAPS: 25.0000 mg | ORAL_CAPSULE | Freq: Four times a day (QID) | ORAL | Status: DC | PRN

## 2015-09-29 MED ORDER — SENNOSIDES-DOCUSATE SODIUM 8.6-50 MG PO TABS
2.0000 | ORAL_TABLET | ORAL | Status: DC
  Administered 2015-09-29: 2 via ORAL
  Filled 2015-09-29: qty 2

## 2015-09-29 MED ORDER — LANOLIN HYDROUS EX OINT: TOPICAL_OINTMENT | CUTANEOUS | Status: DC | PRN

## 2015-09-29 MED ORDER — ACETAMINOPHEN 325 MG PO TABS: 650.0000 mg | ORAL_TABLET | ORAL | Status: DC | PRN

## 2015-09-29 MED ORDER — IBUPROFEN 600 MG PO TABS
600.0000 mg | ORAL_TABLET | Freq: Four times a day (QID) | ORAL | Status: DC
  Administered 2015-09-29 – 2015-09-30 (×6): 600 mg via ORAL
  Filled 2015-09-29 (×6): qty 1

## 2015-09-29 MED ORDER — TETANUS-DIPHTH-ACELL PERTUSSIS 5-2.5-18.5 LF-MCG/0.5 IM SUSP
0.5000 mL | Freq: Once | INTRAMUSCULAR | Status: DC
Start: 2015-09-30 — End: 2015-09-30

## 2015-09-29 MED ORDER — SIMETHICONE 80 MG PO CHEW: 80.0000 mg | CHEWABLE_TABLET | ORAL | Status: DC | PRN

## 2015-09-29 MED ORDER — PRENATAL MULTIVITAMIN CH
1.0000 | ORAL_TABLET | Freq: Every day | ORAL | Status: DC
  Administered 2015-09-29 – 2015-09-30 (×2): 1 via ORAL
  Filled 2015-09-29 (×2): qty 1

## 2015-09-29 NOTE — Lactation Note (Signed)
This note was copied from the chart of Alexandra Hardy. Lactation Consultation Note: Infant is 4914 hours old and has had 3 feedings. This is mother's first child. Mother doesn't speak [place]. Father speaks [place] and can interpret for mother. I preferred to  phone the [place] Spanish Interpretor for all teaching . Assist with hand expression, mother has large amts of colostrum. Infant placed in cross cradle hold . Mother taught how to use good support with pillows. Advised to use the "C" hold when latching infant.  Infant sustained latch with audible swallows for 30 mins. Mother taught breast compression.  Mother taught football hold. Infant latched to alternate breast for 25 mins. Parents very happy to see that infant is feeding well. Reviewed Baby and me book in [place] . Discussed supply and demand, cue base feeding and cluster feeding. Mother was given a hand pump to use when needed. Mother advised to breast feed infant 8-12 times in 24 hours. Parents receptive to all teaching. Lactation Brochure given in Spanish. Mother is aware of available LC services and community support. At the end of my consult hospital Spanish Interpretor [name]( [name] )in room for the remainder of teaching.   Patient Name: Alexandra Hardy BJYNW'GToday's Date: 09/29/2015 Reason for consult: Initial assessment   Maternal Data Has patient been taught Hand Expression?: Yes Does the patient have breastfeeding experience prior to this delivery?: No  Feeding Feeding Type: Breast Fed Length of feed: 25 min  LATCH Score/Interventions Latch: Grasps breast easily, tongue down, lips flanged, rhythmical sucking.  Audible Swallowing: Spontaneous and intermittent  Type of Nipple: Everted at rest and after stimulation  Comfort (Breast/Nipple): Soft / non-tender     Hold (Positioning): Assistance needed to correctly position infant at breast and maintain latch. Intervention(s): Breastfeeding basics  reviewed;Support Pillows;Position options;Skin to skin  LATCH Score: 9  Lactation Tools Discussed/Used     Consult Status Consult Status: Follow-up Date: 09/29/15 Follow-up type: In-patient    Stevan BornKendrick, Onna Nodal [facility] 09/29/2015, 4:43 PM

## 2015-09-29 NOTE — Anesthesia Postprocedure Evaluation (Signed)
  Anesthesia Post-op Note  Patient: Alexandra JourdainMaria Gonzalez Hardy  Procedure(s) Performed: * No procedures listed *  Patient Location: Mother/Baby  Anesthesia Type:Epidural  Level of Consciousness: awake, alert  and oriented  Airway and Oxygen Therapy: Patient Spontanous Breathing  Post-op Pain: none  Post-op Assessment: Post-op Vital signs reviewed, Patient's Cardiovascular Status Stable, Respiratory Function Stable, Patent Airway, No signs of Nausea or vomiting, Adequate PO intake, Pain level controlled, No headache, No backache and Patient able to bend at knees              Post-op Vital Signs: Reviewed and stable  Last Vitals:  Filed Vitals:   09/29/15 0850  BP: 108/59  Pulse: 87  Temp: 36.9 C  Resp: 20    Complications: No apparent anesthesia complications

## 2015-09-29 NOTE — Progress Notes (Signed)
Pt assisted from bed to w/c safely and prepped for tx to postpartum 110 for routine care.

## 2015-09-30 DIAGNOSIS — O4290 Premature rupture of membranes, unspecified as to length of time between rupture and onset of labor, unspecified weeks of gestation: Secondary | ICD-10-CM

## 2015-09-30 LAB — GLUCOSE, CAPILLARY: Glucose-Capillary: 95 mg/dL (ref 65–99)

## 2015-09-30 MED ORDER — IBUPROFEN 600 MG PO TABS: 600.0000 mg | ORAL_TABLET | Freq: Four times a day (QID) | ORAL | Status: DC

## 2015-09-30 NOTE — Discharge Summary (Signed)
OB Discharge Summary  Patient Name: Alexandra Hardy DOB: 1991/08/26 MRN: [id number]  Date of admission: 09/28/2015 Delivering MD: Casey Burkitt   Date of discharge: 09/30/2015  Admitting diagnosis: 38WKS WATER BROKE CONTRACTIONS 2 MINS Intrauterine pregnancy: [redacted]w[redacted]d     Secondary diagnosis:Active Problems:   Labor and delivery indication for care or intervention   Active labor   NSVD (normal spontaneous vaginal delivery)   PROM (premature rupture of membranes)  Additional problems: A2GDM     Discharge diagnosis: Term Pregnancy Delivered                                                                     Post partum procedures:None  Augmentation: Pitocin  Complications: None  Hospital course:  Onset of Labor With Vaginal Delivery     24 y.o. yo G1P1001 at [redacted]w[redacted]d was admitted in Latent Laboron 09/28/2015. Patient had an a labor course, complicated only by prolonged second stage, as follows:  Membrane Rupture Time/Date: 6:30 AM ,09/28/2015   Intrapartum Procedures: Episiotomy: None [1]                                         Lacerations:  1st degree [2]  Patient had a delivery of a Viable infant. 09/29/2015  Information for the patient's newborn:  Roderick Pee, Girl Maryrose [[id number]]  Delivery Method: Vaginal, Spontaneous Delivery (Filed from Delivery Summary)    Pateint had an uncomplicated postpartum course.  She is ambulating, tolerating a regular diet, passing flatus, and urinating well. Patient is discharged home in stable condition on No discharge date for patient encounter.Marland Kitchen    Physical exam  Filed Vitals:   09/29/15 0533 09/29/15 0850 09/29/15 1749 09/30/15 0540  BP: 103/69 108/59 99/51 99/53   Pulse: 82 87 90 82  Temp: 98.4 F (36.9 C) 98.5 F (36.9 C) 98.4 F (36.9 C) 97.8 F (36.6 C)  TempSrc: Oral Oral Oral Oral  Resp: 20 20 20 20   Height:      Weight:      SpO2:       General: alert, cooperative and no distress Lochia:  appropriate Uterine Fundus: firm Incision: N/A DVT Evaluation: No evidence of DVT seen on physical exam. Negative Homan's sign. No cords or calf tenderness. No significant calf/ankle edema. Labs: Lab Results  Component Value Date   WBC 5.7 09/28/2015   HGB 13.6 09/28/2015   HCT 39.6 09/28/2015   MCV 95.0 09/28/2015   PLT 148* 09/28/2015   No flowsheet data found.  Discharge instruction: per After Visit Summary and "Baby and Me Booklet".  After Visit Meds:    Medication List    STOP taking these medications        ACCU-CHEK FASTCLIX LANCETS Misc     ACCU-CHEK NANO SMARTVIEW W/DEVICE Kit     glucose blood test strip  Commonly known as:  ACCU-CHEK SMARTVIEW     glyBURIDE 1.25 MG tablet  Commonly known as:  DIABETA      TAKE these medications        ibuprofen 600 MG tablet  Commonly known as:  ADVIL,MOTRIN  Take 1 tablet (600 mg  total) by mouth every 6 (six) hours.     PRENATAL VITAMIN PO  Take by mouth.        Diet: routine diet  Activity: Advance as tolerated. Pelvic rest for 6 weeks.   Outpatient follow up:6 weeks Follow up Appt: Future Appointments Date Time Provider Department Center  10/27/2015 2:45 PM Catalina Antigua, MD [facility]   Follow up visit: No Follow-up on file.  Postpartum contraception: OCPs  Newborn Data: Live born female  Birth Weight: 7 lb 10.8 oz (3481 g) APGAR: 9, 9  Baby Feeding: Breast Disposition:home with mother  09/30/2015 Jamelle Haring, MD  [facility], PGY-1

## 2015-09-30 NOTE — Discharge Instructions (Signed)

## 2015-09-30 NOTE — Lactation Note (Signed)
This note was copied from the chart of Alexandra Erick AlleyMaria Gonzalez Hardy. Lactation Consultation Note  Patient Name: Alexandra Erick AlleyMaria Gonzalez Hardy UXLKG'MToday's Date: 09/30/2015 Reason for consult: Follow-up assessment Mom had baby latched when LC arrived but at the end of the feeding. Mom reports baby cluster feeding. Reports some mild nipple tenderness, no breakdown reported. Care for sore nipples reviewed, advised to apply EBM, comfort gels given with instructions. Basic teaching reviewed with Mom, continue to BF with feeding ques, 8-12 times or more in 24 hours.  Engorgement care reviewed with Mom. Advised of OP services and support group. Parents want early d/c today but no mention of d/c per Peds note today. 6 voids since birth, 1 stool. Bili in 7.2 at 24 hours which is in high-intermediate zone. Weight loss at 5.2 % in 1st 24 hours.  Lab in to draw blood for serum bili and CBC at the end of this visit. Will plan f/u tomorrow at this point. [employer] interpreter [phone] used for visit.   Maternal Data    Feeding Feeding Type: Breast Fed Length of feed: 50 min  LATCH Score/Interventions                      Lactation Tools Discussed/Used     Consult Status Consult Status: Follow-up Date: 10/01/15 Follow-up type: In-patient    Alfred LevinsGranger, Draken Farrior Ann 09/30/2015, 2:16 PM

## 2015-10-01 ENCOUNTER — Inpatient Hospital Stay (HOSPITAL_COMMUNITY): Admission: RE | Admit: 2015-10-01 | Payer: Self-pay | Source: Ambulatory Visit

## 2015-10-01 ENCOUNTER — Ambulatory Visit: Payer: Self-pay

## 2015-10-01 NOTE — Lactation Note (Signed)
This note was copied from the chart of Alexandra Hardy. Lactation Consultation Note [place] Spanish Interpretor on line with all  teaching. Infant is now under double photo tx and is a baby patient. Assist mother with latching infant. Observed good milk transfer. Reviewed breast compression with mother. Infant sustained latch for 20 mins. Infant latched on the alternate breast and became sleepy. Discussed the need to supplement infant . Infant has had 3 stools in last 24 hours and many wet diapers.  Mother was sat up with a DEBP. Assist with pumping for 15-20 mins. Mother pumped 2-3 ml and ebm was given with a spoon. Infant was then bottle fed by [facility] teaching parents paced bottle feeding . Infant took 15 ml of formula. Mother is receptive to all teaching. Guidelines given to parents to supplement with ebm and formula after breastfeeding. Mother is not active with WIC. Father states that he has talked with someone in the hospital that plans to assist with getting [facility]. Mother to continue to cue base feed and supplement infant.   Patient Name: Alexandra Hardy JYNWG'NToday's Date: 10/01/2015     Maternal Data    Feeding    LATCH Score/Interventions                      Lactation Tools Discussed/Used     Consult Status      Michel BickersKendrick, Selah Klang McCoy 10/01/2015, 3:54 PM

## 2015-10-02 ENCOUNTER — Ambulatory Visit: Payer: Self-pay

## 2015-10-02 NOTE — Lactation Note (Addendum)
This note was copied from the chart of Alexandra Hardy. Lactation Consultation Note Follow up visit at 81 hours of age with FOB interpreting.  Mom feels milk is transitioning and breast are getting more full.   Phototherapy was discontinued today and baby did not have weight loss over night.  Unsure if baby will be a late discharge today after bili check.  Discussed importance of waking baby for feedings and keeping baby active at the breast.  Engorgement care discussed and ice pack given for comfort.   Plan mom to prepump as needed to soften breast and feed baby with cues and waking baby 2 1/2-3 hours.  Mom has ice packs to apply for about 20 minutes prior to latch. Mom to post pump for comfort as needed.  Encouraged mom to supplement back to baby any eBM with syringe. Mom to call for assist as needed.    Patient Name: Alexandra Hardy JYNWG'NToday's Date: 10/02/2015 Reason for consult: Follow-up assessment;Hyperbilirubinemia   Maternal Data Has patient been taught Hand Expression?: Yes  Feeding Feeding Type: Breast Fed Length of feed: 20 min  LATCH Score/Interventions                      Lactation Tools Discussed/Used     Consult Status Consult Status: Follow-up Date: 10/03/15 Follow-up type: In-patient    Alexandra Hardy, Alexandra MerlesJana Hardy 10/02/2015, 10:20 AM

## 2015-10-14 NOTE — Discharge Summary (Signed)
OB Discharge Summary  Patient Name: Alexandra Hardy DOB: 05/02/1991 MRN: [id number]  Date of admission: 09/28/2015 Delivering MD: Casey Burkitt   Date of discharge: 09/30/2015  Admitting diagnosis: 38WKS WATER BROKE CONTRACTIONS 2 MINS Intrauterine pregnancy: [redacted]w[redacted]d  Secondary diagnosis:Active Problems:  Labor and delivery indication for care or intervention  Active labor  NSVD (normal spontaneous vaginal delivery)  PROM (premature rupture of membranes)  Additional problems: A2GDM  Discharge diagnosis: Term Pregnancy Delivered    Post partum procedures:None  Augmentation: Pitocin  Complications: None  Hospital course: Onset of Labor With Vaginal Delivery 24 y.o. yo G1P1001 at [redacted]w[redacted]d was admitted in Latent Laboron 09/28/2015. Patient had an a labor course, complicated only by prolonged second stage, as follows:  Membrane Rupture Time/Date: 6:30 AM ,09/28/2015   Intrapartum Procedures: Episiotomy: None [1]   Lacerations: 1st degree [2]  Patient had a delivery of a Viable infant. 09/29/2015  Information for the patient's newborn:  Alexandra Hardy, Girl King [[id number]]  Delivery Method: Vaginal, Spontaneous Delivery (Filed from Delivery Summary)    Pateint had an uncomplicated postpartum course. She is ambulating, tolerating a regular diet, passing flatus, and urinating well. Patient is discharged home in stable condition on No discharge date for patient encounter.Marland Kitchen    Physical exam  Filed Vitals:   09/29/15 0533 09/29/15 0850 09/29/15 1749 09/30/15 0540  BP: 103/69 108/59 99/51 99/53   Pulse: 82 87 90 82  Temp: 98.4 F (36.9 C) 98.5 F (36.9 C) 98.4 F (36.9 C) 97.8 F (36.6 C)  TempSrc: Oral Oral Oral Oral  Resp: 20 20 20 20   Height:      Weight:       SpO2:       General: alert, cooperative and no distress Lochia: appropriate Uterine Fundus: firm Incision: N/A DVT Evaluation: No evidence of DVT seen on physical exam. Negative Homan's sign. No cords or calf tenderness. No significant calf/ankle edema. Labs:  Recent Labs    Lab Results  Component Value Date   WBC 5.7 09/28/2015   HGB 13.6 09/28/2015   HCT 39.6 09/28/2015   MCV 95.0 09/28/2015   PLT 148* 09/28/2015     No flowsheet data found.  Discharge instruction: per After Visit Summary and "Baby and Me Booklet".  After Visit Meds:    Medication List    STOP taking these medications       ACCU-CHEK FASTCLIX LANCETS Misc     ACCU-CHEK NANO SMARTVIEW W/DEVICE Kit     glucose blood test strip  Commonly known as: ACCU-CHEK SMARTVIEW     glyBURIDE 1.25 MG tablet  Commonly known as: DIABETA      TAKE these medications       ibuprofen 600 MG tablet  Commonly known as: ADVIL,MOTRIN  Take 1 tablet (600 mg total) by mouth every 6 (six) hours.     PRENATAL VITAMIN PO  Take by mouth.        Diet: routine diet  Activity: Advance as tolerated. Pelvic rest for 6 weeks.   Outpatient follow up:6 weeks Follow up Appt: Future Appointments Date Time Provider Department Center  10/27/2015 2:45 PM Catalina Antigua, MD [facility]   Follow up visit: No Follow-up on file.  Postpartum contraception: OCPs  Newborn Data: Live born female  Birth Weight: 7 lb 10.8 oz (3481 g) APGAR: 9, 9  Baby Feeding: Breast Disposition:home with mother  09/30/2015 Jamelle Haring, MD  [facility], PGY-1         Cosigned  by: Rhea Pink, CNM at 10/14/2015 3:55 PM   I have seen and examined this patient; I agree with above documentation in the Resident's note.   Alexandra Hardy is a 24 y.o. G1P1001   PE: BP 99/53 mmHg  Pulse 82  Temp(Src) 97.8 F  (36.6 C) (Oral)  Resp 20  Ht 5\' 1"  (1.549 m)  Wt 64.411 kg (142 lb)  BMI 26.84 kg/m2  SpO2 98%  LMP 12/31/2014 (Exact Date)  Breastfeeding? Unknown Gen: calm comfortable, NAD Resp: normal effort, no distress Abd: gravid  ROS, labs, PMH reviewed  Plan: Discharge  Fantasia Jinkins Grissett 10/14/2015, 3:55 PM

## 2015-10-27 ENCOUNTER — Ambulatory Visit: Payer: Self-pay | Admitting: Obstetrics and Gynecology

## 2015-10-31 ENCOUNTER — Encounter (HOSPITAL_COMMUNITY): Payer: Self-pay | Admitting: *Deleted

## 2015-10-31 ENCOUNTER — Emergency Department (HOSPITAL_COMMUNITY)
Admission: EM | Admit: 2015-10-31 | Discharge: 2015-10-31 | Disposition: A | Discharge status: Home / Self Care | Payer: Medicaid Other | Attending: Emergency Medicine | Admitting: Emergency Medicine

## 2015-10-31 DIAGNOSIS — Z8619 Personal history of other infectious and parasitic diseases: Secondary | ICD-10-CM | POA: Insufficient documentation

## 2015-10-31 DIAGNOSIS — Z8632 Personal history of gestational diabetes: Secondary | ICD-10-CM | POA: Insufficient documentation

## 2015-10-31 DIAGNOSIS — N390 Urinary tract infection, site not specified: Secondary | ICD-10-CM | POA: Insufficient documentation

## 2015-10-31 DIAGNOSIS — N898 Other specified noninflammatory disorders of vagina: Secondary | ICD-10-CM | POA: Insufficient documentation

## 2015-10-31 LAB — URINALYSIS, ROUTINE W REFLEX MICROSCOPIC
Bilirubin Urine: NEGATIVE
GLUCOSE, UA: NEGATIVE mg/dL
KETONES UR: NEGATIVE mg/dL
Nitrite: POSITIVE — AB
PH: 5.5 (ref 5.0–8.0)
PROTEIN: NEGATIVE mg/dL
Specific Gravity, Urine: 1.024 (ref 1.005–1.030)

## 2015-10-31 LAB — URINE MICROSCOPIC-ADD ON

## 2015-10-31 LAB — WET PREP, GENITAL
CLUE CELLS WET PREP: NONE SEEN
Sperm: NONE SEEN
TRICH WET PREP: NONE SEEN
Yeast Wet Prep HPF POC: NONE SEEN

## 2015-10-31 MED ORDER — CEPHALEXIN 500 MG PO CAPS: 500.0000 mg | ORAL_CAPSULE | Freq: Four times a day (QID) | ORAL | Status: DC

## 2015-10-31 NOTE — ED Provider Notes (Signed)
CSN: [id number]     Arrival date & time 10/31/15  0800 History   First MD Initiated Contact with Patient 10/31/15 [phone]     Chief Complaint  Patient presents with  . Fever     (Consider location/radiation/quality/duration/timing/severity/associated sxs/prior Treatment) HPI   Alexandra Hardy is a(n) 24 y.o. female who presents with cc of fever, dysuruia and xuture removal. Translation is utilized as the patient is spansih speaking.  She is s/p SVD of a healthy infant 7 weeks ago. She c/o malaise, dysuria, frequency,and subjective fever. She c/o vaginal irritation when she urinates. She has a hx or UTIs. She has not yet had vaginal intercourse since her delivery.   Past Medical History  Diagnosis Date  . History of HPV infection   . Gestational diabetes    No past surgical history on file. No family history on file. Social History  Substance Use Topics  . Smoking status: Never Smoker   . Smokeless tobacco: Never Used  . Alcohol Use: No   OB History    Gravida Para Term Preterm AB TAB SAB Ectopic Multiple Living   1 1 1       0 1     Review of Systems    Allergies  Review of patient's allergies indicates no known allergies.  Home Medications   Prior to Admission medications   Medication Sig Start Date End Date Taking? Authorizing Provider  ibuprofen (ADVIL,MOTRIN) 600 MG tablet Take 1 tablet (600 mg total) by mouth every 6 (six) hours. 09/30/15   Hillary Percell BostonMoen Fitzgerald, MD  Prenatal Vit-Fe Fumarate-FA (PRENATAL VITAMIN PO) Take by mouth.    Historical Provider, MD   There were no vitals taken for this visit. Physical Exam  Constitutional: She is oriented to person, place, and time. She appears well-developed and well-nourished. No distress.  HENT:  Head: Normocephalic and atraumatic.  Eyes: Conjunctivae are normal. No scleral icterus.  Neck: Normal range of motion.  Cardiovascular: Normal rate, regular rhythm and normal heart sounds.  Exam reveals no gallop  and no friction rub.   No murmur heard. Pulmonary/Chest: Effort normal and breath sounds normal. No respiratory distress.  Abdominal: Soft. Bowel sounds are normal. She exhibits no distension and no mass. There is no tenderness. There is no guarding.  NO CVA tenderness  Neurological: She is alert and oriented to person, place, and time.  Skin: Skin is warm and dry. She is not diaphoretic.    ED Course  Procedures (including critical care time) Labs Review Labs Reviewed - No data to display  Imaging Review No results found. I have personally reviewed and evaluated these images and lab results as part of my medical decision-making.   EKG Interpretation None      MDM   Final diagnoses:  UTI (lower urinary tract infection)  Vaginal irritation    Patient with UTI, vaginal irritataion.  no signs of infection. Will treat with keflex.  [facility] at 1:00pm The patient appears reasonably screened and/or stabilized for discharge and I doubt any other medical condition or other [facility] requiring further screening, evaluation, or treatment in the ED at this time prior to discharge.     Arthor Captainbigail Sheera Illingworth, PA-C 10/31/15 1626  Leta BaptistEmily Roe Nguyen, MD 11/01/15 2053

## 2015-10-31 NOTE — Discharge Instructions (Signed)
Anlisis de [place] (Urinalysis) Un anlisis de [place] (AO) es [place] serie de pruebas realizadas en [place]. Los riones [employer] sangre para Designer, fashion/clothing. Eliminan los desechos y guardan las partes importantes de la sangre como las protenas y los minerales (electrolitos). Es posible que deba realizarse ms pruebas si su AO indica una cantidad excesiva de lo siguiente:  Protenas.  Azcar (glucosa).  Clulas sanguneas.  Bacterias. Se puede realizar un AO:  Como parte del examen general de rutina.  Antes de la [place].  Durante el embarazo. Tambin es posible que le realicen este anlisis si tiene lo siguiente:  Enfermedades renales.  Sntomas de infeccin urinaria.  Diabetes.  Enfermedades que causan un desequilibrio en los sistemas de regulacin hormonal. El AO se divide en tres partes:  Un examen visual de la muestra de orina para determinar el color o la turbidez.  Un anlisis con tira reactiva para determinar lo siguiente:  Protenas.  Concentracin (gravedad especfica).  Acidez (pH).  Glucosa.  Cetonas. Son subproductos que se originan cuando el cuerpo quema grasas para obtener Electrical engineer de International aid/development worker.  Un producto de desecho de los glbulos rojos (bilirrubina).  Un producto de los glbulos blancos (esterasa leucocitaria).  Un producto de las bacterias (nitrito).  Sangre.  Un examen microscpico para controlar lo siguiente:  Glbulos rojos.  Glbulos blancos.  Protenas con forma de tubo (cilindros hialinos).  Estructuras cristalinas.  Bacterias.  Clulas epiteliales. Son las clulas que recubren las vas [place].  Hongos. PREPARACIN PARA EL ANLISIS Algunos medicamentos pueden afectar los resultados de su [place]. Informe a su mdico todos los medicamentos que [place], incluyendo vitaminas, hierbas y [address] de [address]. La muestra para el anlisis se recolecta al orinar en un recipiente limpio. Es posible que se le  solicite que obtenga una muestra de la primera orina de la [place]. Tambin se le puede pedir que obtenga una muestra estril de [place] para evitar que las bacterias presentes en la piel ingresen en la [place]. El mdico puede proporcionarle paos estriles para limpiar la vagina o el pene a fin de prepararse para recolectar una muestra estril. Mujeres y Programmer, multimedia en el inodoro y mantenga abiertos los labios de la vagina.  Con un pao, limpie la zona de la vagina de Camera operator.  Use un segundo pao para limpiar la abertura de Engineer, mining. La uretra es el orificio por el cual se elimina la [place].  Evace una pequea cantidad de orina directamente en el inodoro mientras mantiene los labios vaginales separados.  Sostenga el recipiente estril por debajo y orine dentro de [place].  Llene el recipiente hasta aproximadamente la mitad, luego tpelo y entrguelo para el anlisis. Hombres y nios  Con un pao estril, limpie la punta del pene.  Evacue primero una pequea cantidad de orina directamente en el inodoro.  Orine dentro del recipiente Electronics engineer.  Llene el recipiente hasta aproximadamente la mitad, luego tpelo y entrguelo para el anlisis. RESULTADOS Es su responsabilidad retirar el resultado del [place]. Consulte en el laboratorio o en el departamento en el que fue realizado el estudio cundo y cmo podr [employer]. Comunquese con el mdico si tiene [employer].  Los [employer] de un AO dependen del tipo de anlisis:  El resultado de un examen visual se basar en la apariencia de la orina.  El resultado de un anlisis con tira reactiva ser un rango de valores o bien ser negativo  o positivo.  El resultado de los hallazgos microscpicos ser un rango de valores o bien ser negativo o positivo. Resultados normales Los rangos de los valores normales varan entre diferentes laboratorios y hospitales. Siempre debe  consultar a su mdico despus de realizarse un anlisis u otros estudios para saber si los valores de sus [place] se consideran dentro de los lmites normales.   Aspecto: claro.  Color: amarillo mbar.  Olor: aromtico.  pH: de 4,6 a 8,0 (promedio: 6,0)  Protenas:  de 0a 8mg /dl.  de 50 a 80mg /24 h (en reposo).  menos de 250mg /24h (durante el ejercicio).  Gravedad especfica:  Adultos: de 1,005 a 1,030 (generalmente; 1,010 a 1,025).  Ancianos: los valores disminuyen con la edad.  Recin nacidos: de 1,001 a 1,020.  Esterasa leucocitaria: negativo.  Nitritos: no se observan.  Cetonas: no se observan.  Bilirrubina: no se observa.  Urobilingeno: de 0,01 a 1 unidad Ehrlich/ml.  Cristales: no se observan.  Cilindros: no se observan.  Glucosa:  Nueva muestra: ninguna.  Muestra de 24h: de 50a300mg /24h o 0,3a1,15mmol/da (unidades SI).  Glbulos blancos (GB): de 0 a 4 por campo de [employer].  Cilindros de GB: no se observan.  Glbulos rojos (GR): menos de o igual a 2.  Cilindros de GR: no se observan.  Clulas epiteliales: pocas o de 0 a 4 por campo de [employer].  Bacterias: no se observan.  Hongos: no se observan. Significado de los [employer]  Muchas enfermedades pueden causar resultados anormales del [place]:  La turbidez de la orina puede ser un signo de infeccin urinaria.  La presencia de sangre en la orina puede ser un signo de enfermedad renal, infeccin urinaria u otras afecciones.  La cantidad de glbulos blancos puede ser un signo de infeccin urinaria.  Los cristales pueden indicar la presencia de clculos renales u otras enfermedades renales.  El pH alto puede indicar la presencia de clculos renales, infeccin urinaria o enfermedades renales.  Las protenas pueden ser un signo de enfermedades renales o hipertensin arterial en el embarazo (toxemia) u otras enfermedades.  La glucosa puede ser un signo de  diabetes.  El urobilingeno puede ser un signo de enfermedad heptica. Hable con el mdico sobre el significado de los [employer]. Tal vez deba hacerse ms anlisis.    Esta informacin no tiene Theme park manager el consejo del mdico. Asegrese de hacerle al mdico cualquier pregunta que tenga.   Document Released: 08/29/2013 Document Revised: 11/29/2014 Elsevier Interactive Patient Education 2016 Elsevier Inc.  Infeccin urinaria  (Urinary Tract Infection)  La infeccin urinaria puede ocurrir en cualquier lugar del tracto urinario. El tracto urinario es un sistema de drenaje del cuerpo por el que se eliminan los desechos y el exceso de [place]. El tracto urinario est formado por dos riones, dos urteres, la vejiga y Engineer, mining. Los riones son rganos que tienen forma de frijol. Cada rin tiene aproximadamente el tamao del puo. Estn situados debajo de las [place], uno a cada lado de la columna vertebral CAUSAS  La causa de la infeccin son los microbios, que son organismos microscpicos, que incluyen hongos, virus, y bacterias. Estos organismos son tan pequeos que slo pueden verse a travs del microscopio. Las bacterias son los microorganismos que ms comnmente causan infecciones urinarias.  SNTOMAS  Los sntomas pueden variar segn la edad y el sexo del paciente y por la ubicacin de la infeccin. Los sntomas en las mujeres jvenes incluyen la necesidad frecuente e intensa de Geographical information systems officer y [place]  sensacin dolorosa de ardor en la vejiga o en la uretra durante la miccin. Las mujeres y los hombres mayores podrn sentir cansancio, temblores y debilidad y Futures trader musculares y Engineer, mining abdominal. Si tiene [place], puede significar que la infeccin est en los riones. Otros sntomas son dolor en la espalda o en los lados debajo de las [place], nuseas y vmitos.  DIAGNSTICO  Para diagnosticar una infeccin urinaria, el mdico le preguntar acerca de sus sntomas. [employer] una [place] de [place]. La muestra de orina se analiza para Engineer, manufacturing bacterias y glbulos blancos de Risk manager. Los glbulos blancos se forman en el organismo para ayudar a Artist las infecciones.  TRATAMIENTO  Por lo general, las infecciones urinarias pueden tratarse con medicamentos. Debido a que la [employer] de las infecciones son causadas por bacterias, por lo general pueden tratarse con antibiticos. La eleccin del antibitico y la duracin del tratamiento depender de sus sntomas y el tipo de bacteria causante de la infeccin.  INSTRUCCIONES PARA EL CUIDADO EN EL HOGAR   Si le recetaron antibiticos, tmelos exactamente como su mdico le indique. Termine el medicamento aunque se sienta mejor despus de haber tomado slo algunos.  Beba gran cantidad de lquido para mantener la orina de tono claro o color amarillo plido.  Evite la cafena, el t y las [address]. Estas sustancias irritan la vejiga.  Vaciar la vejiga con frecuencia. Evite retener la orina durante largos perodos.  Vace la vejiga antes y despus de Management consultant.  Despus de mover el intestino, las mujeres deben higienizarse la regin perineal desde adelante hacia atrs. Use slo un papel tissue por vez. SOLICITE ATENCIN MDICA SI:   Siente dolor en la espalda.  Le sube la fiebre.  Los sntomas no mejoran luego de [address]. SOLICITE ATENCIN MDICA DE INMEDIATO SI:   Siente dolor intenso en la espalda o en la zona inferior del abdomen.  Comienza a sentir escalofros.  Tiene nuseas o vmitos.  Tiene una sensacin continua de quemazn o molestias al [employer]. ASEGRESE DE QUE:   Comprende estas instrucciones.  Controlar su enfermedad.  Solicitar ayuda de inmediato si no mejora o empeora.   Esta informacin no tiene Theme park manager el consejo del mdico. Asegrese de hacerle al mdico cualquier pregunta que tenga.   Document Released: 08/18/2005 Document Revised:  08/02/2012 Elsevier Interactive Patient Education 2016 [employer]. Cephalexin tablets or capsules What is this medicine? CEPHALEXIN (sef a LEX in) is a cephalosporin antibiotic. It is used to treat certain kinds of bacterial infections It will not work for colds, flu, or other viral infections. This medicine may be used for other purposes; ask your health care provider or pharmacist if you have questions. What should I tell my health care provider before I take this medicine? They need to know if you have any of these conditions: -kidney disease -stomach or intestine problems, especially colitis -an unusual or allergic reaction to cephalexin, other cephalosporins, penicillins, other antibiotics, medicines, foods, dyes or preservatives -pregnant or trying to get pregnant -breast-feeding How should I use this medicine? Take this medicine by mouth with a full glass of water. Follow the directions on the prescription label. This medicine can be taken with or without food. Take your medicine at regular intervals. Do not take your medicine more often than directed. Take all of your medicine as directed even if you think you are better. Do not skip doses or stop your medicine early. Talk to your pediatrician regarding  the use of this medicine in children. While this drug may be prescribed for selected conditions, precautions do apply. Overdosage: If you think you have taken too much of this medicine contact a poison control center or emergency room at once. NOTE: This medicine is only for you. Do not share this medicine with others. What if I miss a dose? If you miss a dose, take it as soon as you can. If it is almost time for your next dose, take only that dose. Do not take double or extra doses. There should be at least 4 to 6 hours between doses. What may interact with this medicine? -probenecid -some other antibiotics This list may not describe all possible interactions. Give your health care  provider a list of all the medicines, herbs, non-prescription drugs, or dietary supplements you use. Also tell them if you smoke, drink alcohol, or use illegal drugs. Some items may interact with your medicine. What should I watch for while using this medicine? Tell your doctor or health care professional if your symptoms do not begin to improve in a few days. Do not treat diarrhea with over the counter products. Contact your doctor if you have diarrhea that lasts more than 2 days or if it is severe and watery. If you have diabetes, you may get a false-positive result for sugar in your urine. Check with your doctor or health care professional. What side effects may I notice from receiving this medicine? Side effects that you should report to your doctor or health care professional as soon as possible: -allergic reactions like skin rash, itching or hives, swelling of the face, lips, or tongue -breathing problems -pain or trouble passing urine -redness, blistering, peeling or loosening of the skin, including inside the mouth -severe or watery diarrhea -unusually weak or tired -yellowing of the eyes, skin Side effects that usually do not require medical attention (report to your doctor or health care professional if they continue or are bothersome): -gas or heartburn -genital or anal irritation -headache -joint or muscle pain -nausea, vomiting This list may not describe all possible side effects. Call your doctor for medical advice about side effects. You may report side effects to FDA at 1-800-FDA-1088. Where should I keep my medicine? Keep out of the reach of children. Store at room temperature between 59 and 86 degrees F (15 and 30 degrees C). Throw away any unused medicine after the expiration date. NOTE: This sheet is a summary. It may not cover all possible information. If you have questions about this medicine, talk to your doctor, pharmacist, or health care provider.    2016,  Elsevier/Gold Standard. (2008-02-12 17:09:13)

## 2015-10-31 NOTE — ED Notes (Signed)
Pt is post partem 09/29/15 and had 3 stiches due to tearing during childbirth.  Pt states she has had burning and pain while urinating and fever for the last 3 days.  Pt is afraid she has an infection and states she needs her stiches removed.

## 2015-11-03 ENCOUNTER — Ambulatory Visit (INDEPENDENT_AMBULATORY_CARE_PROVIDER_SITE_OTHER): Payer: Self-pay | Admitting: Student

## 2015-11-03 DIAGNOSIS — Z30011 Encounter for initial prescription of contraceptive pills: Secondary | ICD-10-CM

## 2015-11-03 MED ORDER — NORETHINDRONE 0.35 MG PO TABS: 1.0000 | ORAL_TABLET | Freq: Every day | ORAL | Status: DC

## 2015-11-03 NOTE — Progress Notes (Signed)
Patient ID: Alexandra Hardy, female   DOB: 08-16-1991, 24 y.o.   MRN: [id number] Subjective:     Alexandra Hardy is a 24 y.o. female who presents for a postpartum visit. She is 6 week postpartum following a spontaneous vaginal delivery. I have fully reviewed the prenatal and intrapartum course. The delivery was at 38.6 gestational weeks. Outcome: spontaneous vaginal delivery. Anesthesia: epidural. Postpartum course has been uneventful. Baby's course has been normal. Baby is feeding by breast. Bleeding brown. Bowel function is normal. Bladder function is normal. Patient is not sexually active. Contraception method is none. Postpartum depression screening: negative.  The following portions of the patient's history were reviewed and updated as appropriate: allergies, current medications, past family history, past medical history, past social history, past surgical history and problem list.  Review of Systems Pertinent items are noted in HPI.   Objective:    BP 92/57 mmHg  Pulse 71  Temp(Src) 98 F (36.7 C) (Oral)  Resp 18  Wt 130 lb 11.2 oz (59.285 kg)  SpO2 99%  Breastfeeding? Yes  General:  alert, cooperative and appears stated age  Lungs: clear to auscultation bilaterally  Heart:  regular rate and rhythm, S1, S2 normal, no murmur, click, rub or gallop  Abdomen: soft, non-tender; bowel sounds normal; no masses,  no organomegaly   Vulva:  normal  Vagina: normal vagina, no discharge, exudate, lesion, or erythema and repair intact with small stitch left in place. Mild tenderness to palpation.         Assessment:     Normal postpartum exam. Pap smear not done at today's visit.   Plan:   1. Postpartum care and examination   2. Encounter for initial prescription of contraceptive pills      1. Contraception: oral progesterone-only contraceptive 2.  Follow up in: 1 year or as needed.    Judeth HornErin Raynor Calcaterra, NP

## 2015-11-03 NOTE — Patient Instructions (Signed)

## 2015-11-03 NOTE — Progress Notes (Deleted)
Patient ID: Erick AlleyMaria Gonzalez Zepahua, female   DOB: September 10, 1991, 24 y.o.   MRN: [id number] Postpartum Visit Patient is here for a postpartum visit. She is {0-10:33138} weeks postpartum following a {delivery:12449}. I have fully reviewed the prenatal and intrapartum course. The delivery was at *** gestational weeks. Outcome: {delivery outcome:32078}. Anesthesia: {procedures; anesthesia:812}.  Postpartum course has been ***. Baby's course has been ***. Baby is feeding by {breast/bottle:69}. Bleeding {vag bleed:12292}. Bowel function is {normal:32111}. Bladder function is {normal:32111}. Patient {is/is not:9024} sexually active. Contraception method is {contraceptive method:5051}. Postpartum depression screening: {neg default:13464::"negative"}.

## 2015-11-05 ENCOUNTER — Ambulatory Visit: Payer: Medicaid Other | Admitting: Obstetrics & Gynecology

## 2015-11-13 ENCOUNTER — Ambulatory Visit: Payer: Medicaid Other | Admitting: Certified Nurse Midwife

## 2015-11-28 ENCOUNTER — Ambulatory Visit: Payer: Medicaid Other | Admitting: Family Medicine

## 2015-12-19 ENCOUNTER — Encounter: Payer: Self-pay | Admitting: Family Medicine

## 2015-12-19 ENCOUNTER — Ambulatory Visit: Payer: Medicaid Other | Admitting: Family Medicine

## 2015-12-30 ENCOUNTER — Encounter: Payer: Self-pay | Admitting: Student

## 2015-12-30 ENCOUNTER — Ambulatory Visit (INDEPENDENT_AMBULATORY_CARE_PROVIDER_SITE_OTHER): Payer: Medicaid Other | Admitting: Student

## 2015-12-30 VITALS — BP 102/73 | HR 70 | Temp 97.9°F | Wt 128.3 lb

## 2015-12-30 DIAGNOSIS — N61 Mastitis without abscess: Secondary | ICD-10-CM

## 2015-12-30 MED ORDER — CEPHALEXIN 500 MG PO CAPS: 500.0000 mg | ORAL_CAPSULE | Freq: Four times a day (QID) | ORAL | Status: DC

## 2015-12-30 NOTE — Progress Notes (Signed)
Pt reports fevers chills and breast pain.  Used video interpreter Byrd Hesselbach.

## 2015-12-30 NOTE — Patient Instructions (Signed)
Mastitis  °(Mastitis) °La mastitis es una inflamación en el tejido mamario. Generalmente ocurre en las mujeres que amamantan, pero también puede afectar a otras mujeres y, en algunos casos, a los hombres. °CAUSAS  °Generalmente la causa de la mastitis es una infección bacteriana. Las bacterias ingresan al tejido mamario través de cortes o grietas en la piel. Generalmente esto ocurre al amamantar, debido a las grietas o irritación de la piel. En algunos casos puede ocurrir aún cuando no haya grietas en la piel. También se relaciona con la obstrucción de los conductos por los que sale la leche (lactíferos). Un piercing en los pezones puede ocasionar una mastitis. Además, algunas formas de cáncer de mama pueden causar mastitis. °SIGNOS Y SÍNTOMAS  °· Hinchazón, enrojecimiento, sensibilidad y dolor en la zona de la mama. °· Hinchazón de los ganglios que se encuentran debajo del brazo, en el mismo lado. °· Fiebre. °Si se permite que la infección progrese, podrá formarse una acumulación de pus (absceso). °DIAGNÓSTICO  °El médico podrá hacer el diagnóstico de mastitis en base a sus síntomas y el examen físico. Le indicarán estudios para confirmar el diagnóstico. Estos pueden ser:  °· Extracción del pus de la mama, aplicando presión en la zona. El pus se examinará en el laboratorio para determinar de qué bacteria se trata. Si hay un absceso, podrán retirarle el líquido con una aguja. Con el líquido se confirmará el diagnóstico y se determinará la bacteria que causa el problema. En la mayoría de los casos no se observa pus. °· Le solicitarán análisis de sangre para determinar si su organismo está luchando contra una infección bacteriana. °· Una mamografía o una ecografía podrán descartar otros problemas o enfermedades. °TRATAMIENTO  °Antibióticos para combatir la infección bacteriana. El profesional determinará qué bacteria es la que está causando la infección y seleccionará el tipo de antibiótico más adecuado. Podrá  cambiarlo según el resultado del cultivo o si la respuesta al antibiótico no es la adecuada. Los antibióticos se administran por vía oral. También le recetará medicamentos para el dolor. °La mastitis que se produce debido al amamantamiento podrá mejorar sin tratamiento, por lo tanto el médico podrá indicarle que espere 24 horas después de verla por primera vez para decidir si necesita recetarle un medicamento. °INSTRUCCIONES PARA EL CUIDADO EN EL HOGAR  °· Sólo tome medicamentos de venta libre o recetados para calmar el dolor, el malestar o bajar la fiebre, según las indicaciones de su médico. °· Si su médico le receta antibióticos, tómelos tal como le indicó. Asegúrese de que finaliza la prescripción completa aunque se sienta mejor. °· No use un sostén demasiado ajustado o con aro. Use un sostén blando, de soporte. °· Aumente la ingestión de líquidos, especialmente si tiene fiebre. °· Las mujeres que amamantan deben seguir las siguientes indicaciones: °¨ Amamante hasta vaciar la mama. El profesional le informará si su leche es segura para el bebé o debe descartarla. Podrán indicarle que deje de amamantar hasta que el médico considere que es seguro para su bebé. Use un sacaleche si le aconsejan dejar de amamantar. °¨ Mantenga los pezones secos y limpios. °¨ Vacíe la primera mama completamente antes de amamantar con la segunda. Si el bebé no vacía la mama por algún motivo, utilice un sacaleche. °¨ Si debe regresar a su empleo, use un sacaleche en el horario de trabajo para mantener los horarios. °¨ Evite que las mamas se llenen mucho de leche (congestión) °SOLICITE ATENCIÓN MÉDICA SI:  °· Tiene una secreción similar a pus por   la mama.  Los sntomas no mejoran con el tratamiento indicado por su mdico dentro de los [address]  [address] SOLICITE ATENCIN MDICA DE INMEDIATO SI:   El dolor y la hinchazn empeoran.  Aumenta el dolor y no puede controlarlo con Tourist information centre manager.  Observa una lnea roja que se extiende desde la  mama hasta la axila.  Tiene fiebre o sntomas persistentes durante ms de 2 - 3 das.  Tiene fiebre y los sntomas empeoran repentinamente.   Esta informacin no tiene Theme park manager el consejo del mdico. Asegrese de hacerle al mdico cualquier pregunta que tenga.   Document Released: 08/18/2005 Document Revised: 11/13/2013 Elsevier Interactive Patient Education [employer]

## 2015-12-30 NOTE — Progress Notes (Signed)
Subjective:     Patient ID: Alexandra Hardy, female   DOB: [birth year], 25 y.o.   MRN: [id number]  HPI Patient presents with complaints of breast pain. States feels lump in breast since last Tuesday. Right breast has felt warm & tender, symptoms worse while breastfeeding. Breastfeeding every 3-4 hours. Reports chills & feels like has had fever but didn't have thermometer to check temp.   Review of Systems  Constitutional: Positive for chills. Negative for fever.  HENT: Negative.   Respiratory: Positive for cough.   Gastrointestinal: Negative.  Negative for nausea and vomiting.  Skin:       Right breast tenderness       Objective:   Physical Exam  Constitutional: She appears well-developed and well-nourished. No distress.  HENT:  Head: Normocephalic and atraumatic.  Pulmonary/Chest: Effort normal. No respiratory distress. Right breast exhibits skin change (area of erythema, warmth & tenderness. Right breast @ 1 o'clock. Small firm mass (2 cm) feels like plugged duct) and tenderness. Right breast exhibits no inverted nipple and no nipple discharge.  Musculoskeletal: Normal range of motion.  Lymphadenopathy:    She has no cervical adenopathy.  Skin: She is not diaphoretic.  Psychiatric: She has a normal mood and affect. Her behavior is normal. Judgment normal.   BP 102/73 mmHg  Pulse 70  Temp(Src) 97.9 F (36.6 C) (Oral)  Wt 128 lb 4.8 oz (58.196 kg)  Breastfeeding? Yes     Assessment:     Mastitis, right, acute      Plan:     1. Mastitis, right, acute  -Rx for keflex 500 mg QID x 7 days -Call for f/u appt if symptoms don't improve with abx -Go to [facility] if symptoms worsen -Take ibuprofen prn pain per bottle instructions - Ensure breastfeeding/pumping every 2-3 hrs     Judeth Horn, NP

## 2016-01-02 ENCOUNTER — Other Ambulatory Visit: Payer: Self-pay

## 2016-01-02 DIAGNOSIS — O24439 Gestational diabetes mellitus in the puerperium, unspecified control: Secondary | ICD-10-CM

## 2016-01-03 LAB — GLUCOSE TOLERANCE, 2 HOURS
GLUCOSE, FASTING: 68 mg/dL (ref 65–99)
Glucose, 2 hour: 100 mg/dL (ref 70–139)

## 2016-01-05 ENCOUNTER — Telehealth: Payer: Self-pay

## 2016-01-05 NOTE — Telephone Encounter (Signed)
2hr GTT normal. Should follow up with PCP for regular screening.    I attempted to call patient but her voicemail has not been set up

## 2016-01-09 ENCOUNTER — Ambulatory Visit: Payer: Medicaid Other | Admitting: Obstetrics & Gynecology

## 2016-01-13 NOTE — Telephone Encounter (Signed)
Called patient with Alexandra Hardy for interpreter, no answer- unable to leave message due to no voicemail set up.

## 2016-01-15 NOTE — Telephone Encounter (Signed)
Called patient with pacific interpreter [phone] and informed her of results & recommendations. Patient verbalized understanding & had no questions

## 2016-06-16 ENCOUNTER — Ambulatory Visit (INDEPENDENT_AMBULATORY_CARE_PROVIDER_SITE_OTHER): Payer: Self-pay | Admitting: General Practice

## 2016-06-16 DIAGNOSIS — B379 Candidiasis, unspecified: Secondary | ICD-10-CM

## 2016-06-16 DIAGNOSIS — R300 Dysuria: Secondary | ICD-10-CM

## 2016-06-16 LAB — POCT URINALYSIS DIP (DEVICE)
BILIRUBIN URINE: NEGATIVE
GLUCOSE, UA: NEGATIVE mg/dL
HGB URINE DIPSTICK: NEGATIVE
Ketones, ur: NEGATIVE mg/dL
LEUKOCYTES UA: NEGATIVE
NITRITE: NEGATIVE
Protein, ur: NEGATIVE mg/dL
SPECIFIC GRAVITY, URINE: 1.025 (ref 1.005–1.030)
Urobilinogen, UA: 0.2 mg/dL (ref 0.0–1.0)
pH: 5.5 (ref 5.0–8.0)

## 2016-06-16 MED ORDER — FLUCONAZOLE 150 MG PO TABS: 150.0000 mg | ORAL_TABLET | Freq: Once | ORAL | 0 refills | Status: AC

## 2016-06-16 NOTE — Progress Notes (Signed)
Patient here today for UA with her husband. Patient reports some occasional burning with urination but also reports vaginal itching/discharge. Informed patient that we will send in a prescription for a yeast infection as it is likely that. Patient also requests appt for birth control. Discussed no unprotected sex until appt and encouraged her to make appt in our office. Patient had no questions

## 2016-07-08 ENCOUNTER — Ambulatory Visit: Payer: Self-pay | Admitting: Family Medicine

## 2016-09-04 IMAGING — US US OB COMP +14 WK
2 series · 12 of 28 positions shown · non-contrast
Comparison: none

[Series 1: us ob +14 all · 76 acquisitions, 10 frames shown (1 of 2)]
[im 4/76]
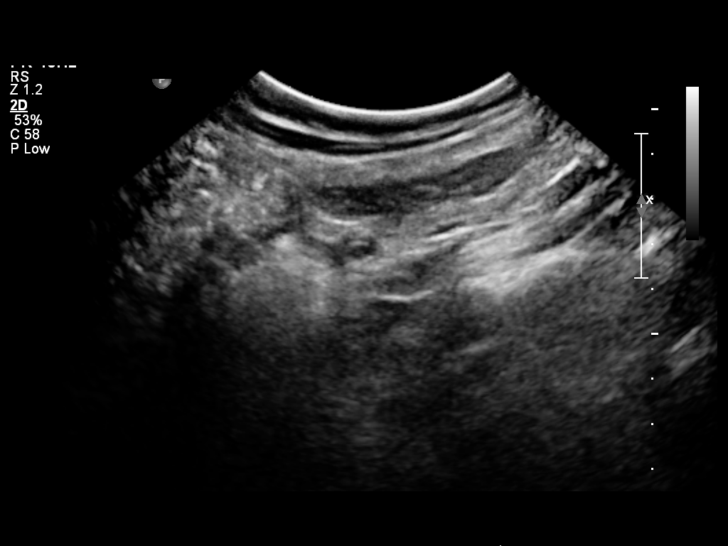
[im 10/76]
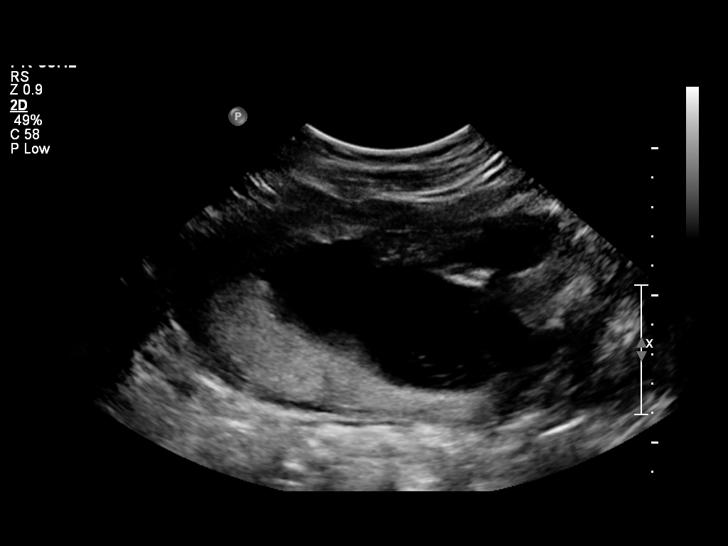
[im 17/76]
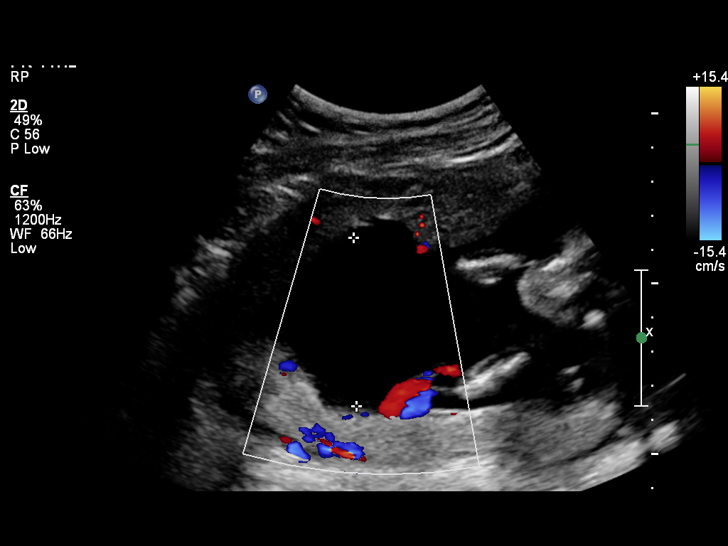
[im 27/76]
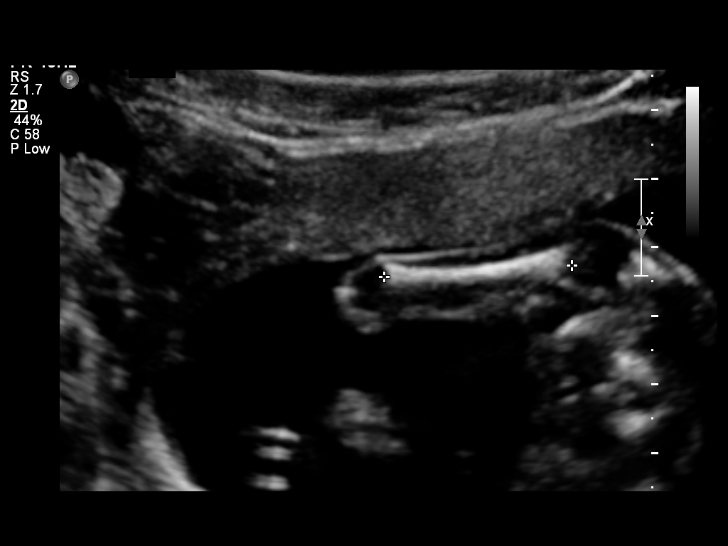
[im 33/76]
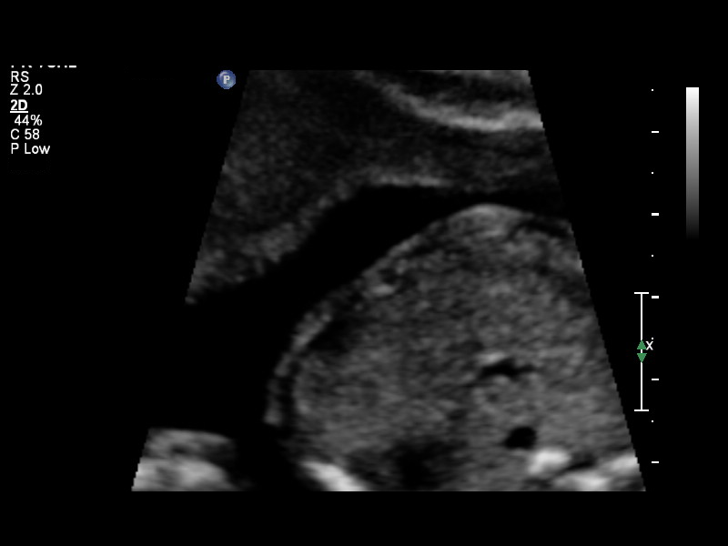
[im 40/76]
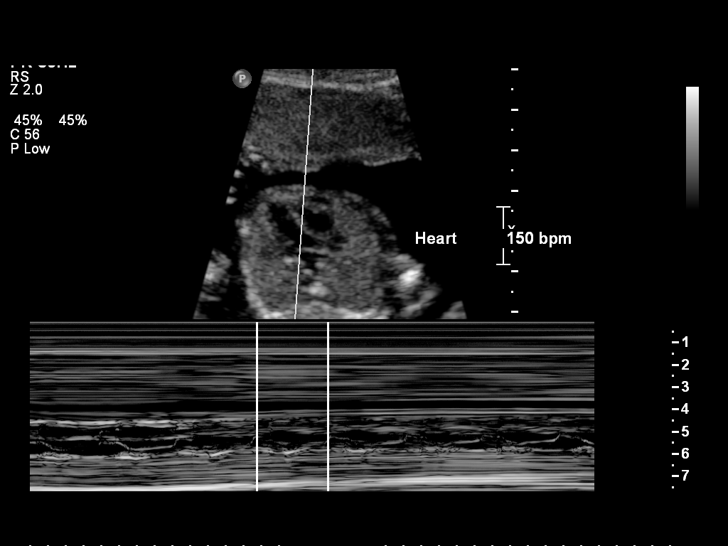
[im 49/76]
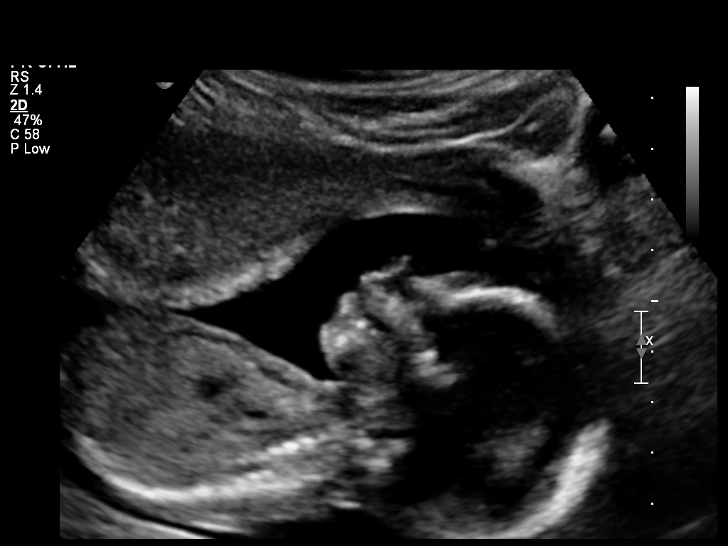
[im 56/76]
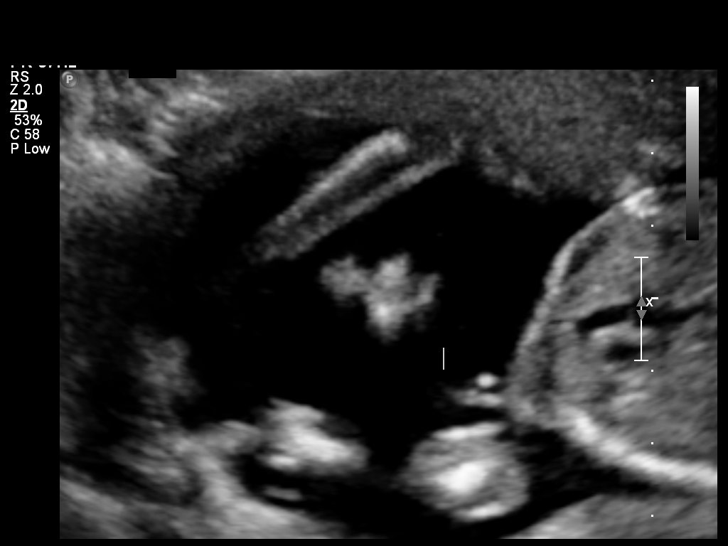
[im 62/76]
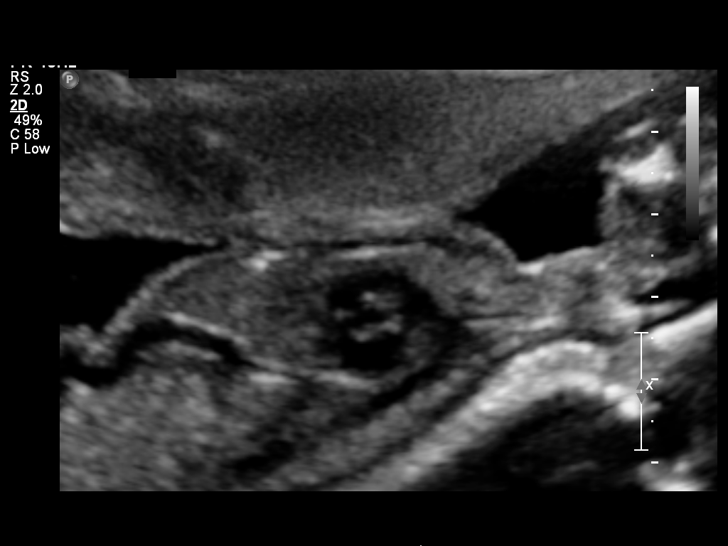
[im 72/76]
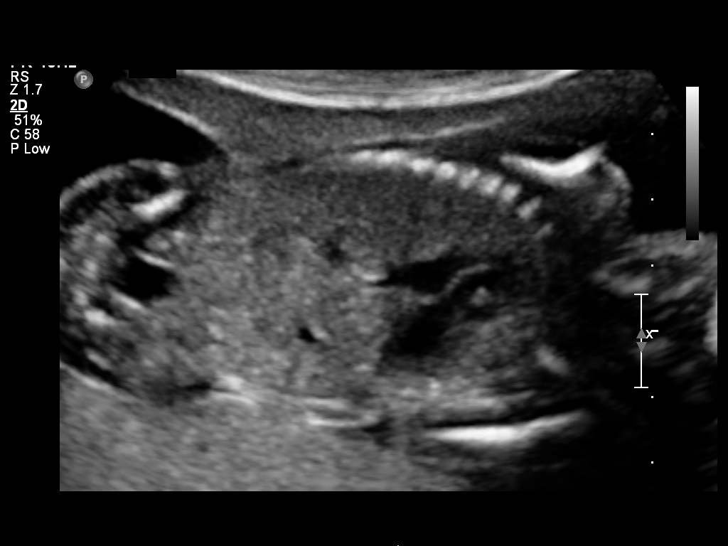

[Series 1: us ob +14 all · 2 of 14 slices shown (2 of 2)]
[im 1/14]
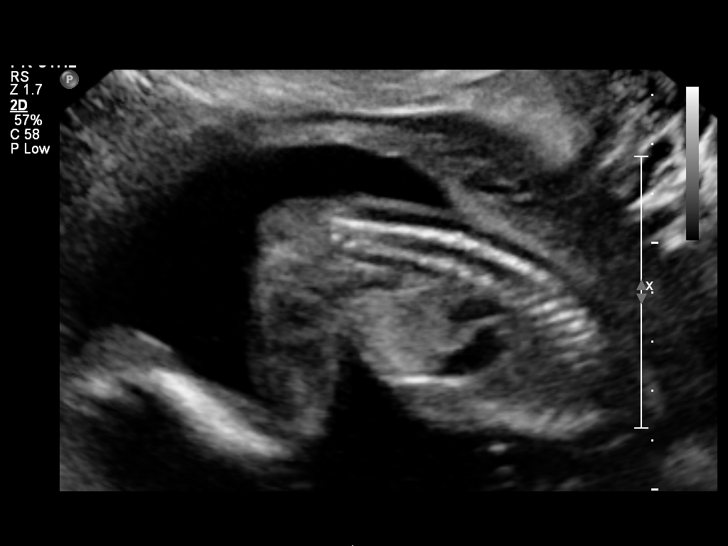
[im 9/14]
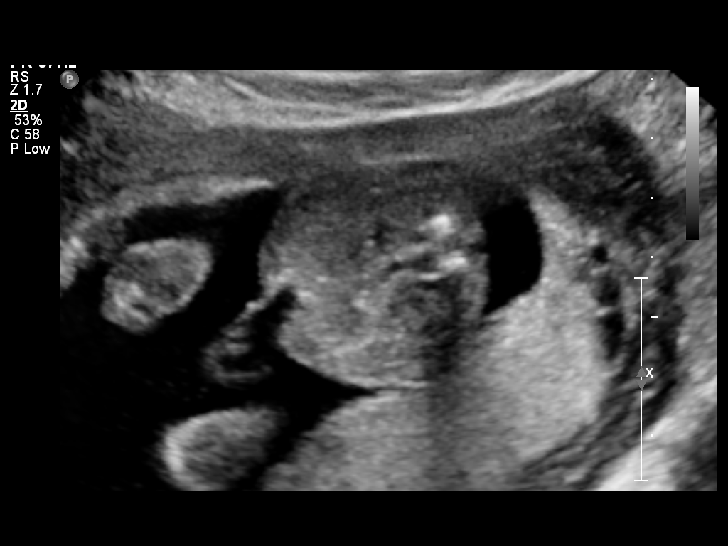

[12 of 28 positions shown; findings below may reference images not displayed]

OBSTETRICS REPORT
(Signed Final 05/13/2015 [DATE])

TIGER

Service(s) Provided

US OB COMP + 14 WK                                    76805.1
Indications

19 weeks gestation of pregnancy
Basic anatomic survey                                 [id number]
Fetal Evaluation

Num Of Fetuses:    1
Fetal Heart Rate:  150                          bpm
Cardiac Activity:  Observed
Presentation:      Cephalic
Placenta:          Posterior, above cervical
os
P. Cord            Visualized
Insertion:

Amniotic Fluid
AFI FV:      Subjectively within normal limits
Larg Pckt:     4.9  cm
Biometry

BPD:     45.6  mm     G. Age:  19w 5d                CI:        78.58   70 - 86
FL/HC:      16.9   16.1 -
18.3
HC:     162.7  mm     G. Age:  19w 0d       45  %    HC/AC:      1.13   1.09 -
1.39
AC:     144.1  mm     G. Age:  19w 5d       70  %    FL/BPD:
FL:      27.5  mm     G. Age:  18w 3d       23  %    FL/AC:      19.1   20 - 24
HUM:     26.8  mm     G. Age:  18w 4d       37  %
CER:     18.9  mm     G. Age:  18w 3d       34  %
NFT:     4.97  mm

Est. FW:     276  gm    0 lb 10 oz      47  %
Gestational Age

LMP:           19w 0d        Date:  12/31/14                 EDD:   10/07/15
U/S Today:     19w 1d                                        EDD:   10/06/15
Best:          19w 0d     Det. By:  LMP  (12/31/14)          EDD:   10/07/15
Anatomy

Cranium:          Appears normal         Aortic Arch:      Appears normal
Fetal Cavum:      Appears normal         Ductal Arch:      Appears normal
Ventricles:       Appears normal         Diaphragm:        Appears normal
Choroid Plexus:   Appears normal         Stomach:          Appears normal, left
sided
Cerebellum:       Appears normal         Abdomen:          Appears normal
Posterior Fossa:  Appears normal         Abdominal Wall:   Appears nml (cord
insert, abd wall)
Nuchal Fold:      Appears normal         Cord Vessels:     Appears normal (3
vessel cord)
Face:             Appears normal         Kidneys:          Appear normal
(orbits and profile)
Lips:             Appears normal         Bladder:          Appears normal
Heart:            Appears normal         Spine:            Appears normal
(4CH, axis, and
situs)
RVOT:             Appears normal         Lower             Appears normal
Extremities:
LVOT:             Appears normal         Upper             Appears normal
Extremities:

Other:  Fetus appears to be a female. Heels and 5th digit visualized.
Targeted Anatomy

Fetal Central Nervous System
Cisterna Magna:
Cervix Uterus Adnexa

Cervical Length:    2.93     cm

Cervix:       Normal appearance by transabdominal scan.

Left Ovary:    Size(cm) L: 3.63 x W: 2.74 x H: 2.85  Volume(cc):
14.8
Right Ovary:   Not visualized. No adnexal mass visualized.
Impression

Single IUP at 19w 0d
Normal fetal anatomic survey
No markers associated with aneuploidy noted
Posterior placenta without previa
Normal amniotic fluid volume
Recommendations

Follow-up ultrasounds as clinically indicated.

## 2017-02-23 ENCOUNTER — Emergency Department (HOSPITAL_COMMUNITY)
Admission: EM | Admit: 2017-02-23 | Discharge: 2017-02-23 | Disposition: A | Discharge status: Home / Self Care | Payer: Self-pay | Attending: Emergency Medicine | Admitting: Emergency Medicine

## 2017-02-23 ENCOUNTER — Encounter (HOSPITAL_COMMUNITY): Payer: Self-pay | Admitting: *Deleted

## 2017-02-23 DIAGNOSIS — F419 Anxiety disorder, unspecified: Secondary | ICD-10-CM | POA: Insufficient documentation

## 2017-02-23 MED ORDER — LORAZEPAM 1 MG PO TABS: 1.0000 mg | ORAL_TABLET | Freq: Three times a day (TID) | ORAL | 0 refills | Status: DC | PRN

## 2017-02-23 MED ORDER — LORAZEPAM 1 MG PO TABS
1.0000 mg | ORAL_TABLET | Freq: Once | ORAL | Status: AC
  Administered 2017-02-23: 1 mg via ORAL
  Filled 2017-02-23: qty 1
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
## 2017-02-23 NOTE — ED Notes (Signed)
OK for DC. Spouse is happy instructions are in spanish so wife can read and believe the doctor's instructions.
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
## 2017-02-25 ENCOUNTER — Ambulatory Visit (INDEPENDENT_AMBULATORY_CARE_PROVIDER_SITE_OTHER): Payer: Self-pay | Admitting: Emergency Medicine

## 2017-02-25 VITALS — BP 106/71 | HR 75 | Temp 98.2°F | Resp 16 | Ht 63.0 in | Wt 128.6 lb

## 2017-02-25 DIAGNOSIS — M791 Myalgia: Secondary | ICD-10-CM

## 2017-02-25 DIAGNOSIS — M549 Dorsalgia, unspecified: Secondary | ICD-10-CM | POA: Insufficient documentation

## 2017-02-25 DIAGNOSIS — R0789 Other chest pain: Secondary | ICD-10-CM

## 2017-02-25 DIAGNOSIS — M7918 Myalgia, other site: Secondary | ICD-10-CM

## 2017-02-25 NOTE — Progress Notes (Signed)
Alexandra Hardy 26 y.o.   Chief Complaint  Patient presents with  . Chest Pain  . Arm Pain    left    HISTORY OF PRESENT ILLNESS: This is a 26 y.o. female complaining of left sided chest pain almost steady for the past 2-3 weeks; lactating; no lifestyle changes; healthy lifestyle; not diabetic; no cardiac hx or risk factors, non-smoker; pain is dull and starts in left upper back into the left upper chest; no associated symptoms.  HPI   Prior to Admission medications   Medication Sig Start Date End Date Taking? Authorizing Provider  Prenatal Vit-Fe Fumarate-FA (PRENATAL VITAMIN PO) Take 1 tablet by mouth daily.    Yes Historical Provider, MD  LORazepam (ATIVAN) 1 MG tablet Take 1 tablet (1 mg total) by mouth 3 (three) times daily as needed for anxiety. Patient not taking: Reported on 02/25/2017 02/23/17   Roxy Horseman, PA-C  norethindrone (MICRONOR,CAMILA,ERRIN) 0.35 MG tablet Take 1 tablet (0.35 mg total) by mouth daily. Patient not taking: Reported on 02/25/2017 11/03/15   Judeth Horn, NP    No Known Allergies  Patient Active Problem List   Diagnosis Date Noted  . PROM (premature rupture of membranes) 09/30/2015  . NSVD (normal spontaneous vaginal delivery) 09/29/2015  . Labor and delivery indication for care or intervention 09/28/2015  . Active labor 09/28/2015  . Gestational diabetes mellitus, antepartum 08/04/2015  . Pap smear of cervix shows high risk HPV present 05/14/2015  . Supervision of high-risk pregnancy 03/12/2015  . Language barrier affecting health care 03/12/2015    Past Medical History:  Diagnosis Date  . Gestational diabetes   . History of HPV infection     No past surgical history on file.  Social History   Social History  . Marital status: Single    Spouse name: N/A  . Number of children: N/A  . Years of education: N/A   Occupational History  . Not on file.   Social History Main Topics  . Smoking status: Never Smoker  . Smokeless  tobacco: Never Used  . Alcohol use No  . Drug use: No  . Sexual activity: Yes   Other Topics Concern  . Not on file   Social History Narrative  . No narrative on file    No family history on file.   Review of Systems  Constitutional: Negative.  Negative for chills, diaphoresis, fever and malaise/fatigue.  HENT: Negative.  Negative for nosebleeds, sinus pain and sore throat.   Eyes: Negative.  Negative for blurred vision and double vision.  Respiratory: Negative.  Negative for cough, hemoptysis, shortness of breath and wheezing.   Cardiovascular: Positive for chest pain. Negative for palpitations, orthopnea, claudication and leg swelling.  Gastrointestinal: Negative.  Negative for abdominal pain, diarrhea, nausea and vomiting.  Genitourinary: Negative.  Negative for dysuria and hematuria.  Musculoskeletal: Positive for back pain. Negative for myalgias and neck pain.  Skin: Negative.  Negative for rash.  Neurological: Negative for dizziness, sensory change, focal weakness and headaches.  Endo/Heme/Allergies: Negative.   All other systems reviewed and are negative.  Vitals:   02/25/17 1152  BP: 106/71  Pulse: 75  Resp: 16  Temp: 98.2 F (36.8 C)   Ekg: NSR; no acute ischemic changes.  Physical Exam  Constitutional: She is oriented to person, place, and time. She appears well-developed and well-nourished.  HENT:  Head: Normocephalic.  Nose: Nose normal.  Mouth/Throat: Oropharynx is clear and moist. No oropharyngeal exudate.  Eyes: EOM are normal. Pupils  are equal, round, and reactive to light.  Neck: Normal range of motion. Neck supple. No JVD present. No thyromegaly present.  Cardiovascular: Normal rate, regular rhythm, normal heart sounds and intact distal pulses.   Pulmonary/Chest: Effort normal and breath sounds normal. She exhibits no tenderness.  Abdominal: Soft. Bowel sounds are normal. She exhibits no distension. There is no tenderness.  Musculoskeletal: Normal  range of motion.  Left upper back tenderness and spasm  Lymphadenopathy:    She has no cervical adenopathy.  Neurological: She is alert and oriented to person, place, and time. No sensory deficit. She exhibits normal muscle tone. Coordination normal.  Skin: Skin is warm and dry. Capillary refill takes less than 2 seconds.  Psychiatric: She has a normal mood and affect. Her behavior is normal.  Vitals reviewed.    ASSESSMENT & PLAN:  Alexandra Hardy was seen today for chest pain and arm pain.  Diagnoses and all orders for this visit:  Atypical chest pain -     EKG 12-Lead  Upper back pain on left side  Musculoskeletal pain   Patient Instructions       IF you received an x-ray today, you will receive an invoice from [facility]. Please contact [facility] at [phone] with questions or concerns regarding your invoice.   IF you received labwork today, you will receive an invoice from [facility]. Please contact [facility] at [phone] with questions or concerns regarding your invoice.   Our billing staff will not be able to assist you with questions regarding bills from these companies.  You will be contacted with the lab results as soon as they are available. The fastest way to get your results is to activate your My Chart account. Instructions are located on the last page of this paperwork. If you have not heard from [place] regarding the results in 2 weeks, please contact this office.    Dolor de pecho inespecfico (Nonspecific Chest Pain) Suele ser difcil encontrar la causa del dolor de Alexandra Hardy. Siempre existe una posibilidad de que el dolor est relacionado con algo grave, como un infarto de miocardio o un cogulo sanguneo en los pulmones. Hay muchas enfermedades que no son potencialmente mortales que pueden causar dolor de Alexandra Hardy. Es importante que concurra a las visitas de control con el mdico. CUIDADOS EN EL HOGAR  Si le recetaron antibiticos, asegrese de  terminarlos, incluso si comienza a Actor.  Evite las [employer] causen dolor de [place].  No consuma ningn producto que contenga tabaco, lo que incluye cigarrillos, tabaco de Theatre manager o Administrator, Civil Service. Si necesita ayuda para dejar de fumar, consulte al mdico.  No beba alcohol.  Tome los medicamentos solamente como se lo haya indicado el mdico.  Concurra a todas las visitas de control como se lo haya indicado el mdico. Esto es importante. Esto incluye otros estudios si el dolor de pecho no desaparece.  El mdico puede indicarle que mantenga la cabeza levantada (elevada) mientras duerme.  Haga cambios en su estilo de vida segn las indicaciones del mdico. Estos pueden incluir lo siguiente:  Education administrator actividad fsica con regularidad. Pdale al mdico que le sugiera algunas actividades que sean seguras para usted.  Consumir una dieta cardiosaludable. El mdico o un especialista en alimentacin (nutricionista) pueden ayudarlo a que haga elecciones saludables.  Mantener un peso saludable.  Controlar la diabetes, si es necesario.  Reducir las situaciones de estrs. SOLICITE AYUDA SI:  El dolor de pecho no desaparece, incluso despus del tratamiento.  [place]  erupcin cutnea con ampollas en el pecho.  Tiene fiebre. SOLICITE AYUDA DE INMEDIATO SI:  El dolor en el pecho es ms intenso.  La tos empeora, o expectora sangre.  Siente un dolor intenso en el vientre (abdomen).  Se siente muy dbil.  Pierde el conocimiento (se desmaya).  Tiene escalofros.  Tiene una molestia repentina e inexplicable en el pecho.  Tiene molestias repentinas e [employer], la espalda, el cuello o la [place].  Le falta el aire en cualquier momento.  Comienza a sudar de [place] repentina o la piel se le humedece.  Siente nuseas.  Vomita.  Se siente repentinamente mareado o se desmaya.  Siente que el corazn comienza a latir rpidamente o que se  saltea latidos. Estos sntomas pueden Customer service manager. No espere hasta que los sntomas desaparezcan. Solicite atencin mdica de inmediato. Comunquese con el servicio de emergencias de su localidad (911 en los [place]). No conduzca por sus propios medios [employer]. Esta informacin no tiene Theme park manager el consejo del mdico. Asegrese de hacerle al mdico cualquier pregunta que tenga. Document Released: 02/04/2009 Document Revised: 11/29/2014 Document Reviewed: 05/17/2016 Elsevier Interactive Patient Education  2017 Elsevier Inc.      Edwina Barth, MD [facility]

## 2017-02-25 NOTE — Patient Instructions (Addendum)
IF you received an x-ray today, you will receive an invoice from [facility]. Please contact [facility] at [phone] with questions or concerns regarding your invoice.   IF you received labwork today, you will receive an invoice from [facility]. Please contact [facility] at [phone] with questions or concerns regarding your invoice.   Our billing staff will not be able to assist you with questions regarding bills from these companies.  You will be contacted with the lab results as soon as they are available. The fastest way to get your results is to activate your My Chart account. Instructions are located on the last page of this paperwork. If you have not heard from [place] regarding the results in 2 weeks, please contact this office.    Dolor de pecho inespecfico (Nonspecific Chest Pain) Suele ser difcil encontrar la causa del dolor de [place]. Siempre existe una posibilidad de que el dolor est relacionado con algo grave, como un infarto de miocardio o un cogulo sanguneo en los pulmones. Hay muchas enfermedades que no son potencialmente mortales que pueden causar dolor de [place]. Es importante que concurra a las visitas de control con el mdico. CUIDADOS EN EL HOGAR  Si le recetaron antibiticos, asegrese de terminarlos, incluso si comienza a Actor.  Evite las [employer] causen dolor de [place].  No consuma ningn producto que contenga tabaco, lo que incluye cigarrillos, tabaco de Theatre manager o Administrator, Civil Service. Si necesita ayuda para dejar de fumar, consulte al mdico.  No beba alcohol.  Tome los medicamentos solamente como se lo haya indicado el mdico.  Concurra a todas las visitas de control como se lo haya indicado el mdico. Esto es importante. Esto incluye otros estudios si el dolor de pecho no desaparece.  El mdico puede indicarle que mantenga la cabeza levantada (elevada) mientras duerme.  Haga cambios en su estilo de vida segn  las indicaciones del mdico. Estos pueden incluir lo siguiente:  Education administrator actividad fsica con regularidad. Pdale al mdico que le sugiera algunas actividades que sean seguras para usted.  Consumir una dieta cardiosaludable. El mdico o un especialista en alimentacin (nutricionista) pueden ayudarlo a que haga elecciones saludables.  Mantener un peso saludable.  Controlar la diabetes, si es necesario.  Reducir las situaciones de estrs. SOLICITE AYUDA SI:  El dolor de pecho no desaparece, incluso despus del tratamiento.  Tiene una erupcin cutnea con ampollas en el pecho.  Tiene fiebre. SOLICITE AYUDA DE INMEDIATO SI:  El dolor en el pecho es ms intenso.  La tos empeora, o expectora sangre.  Siente un dolor intenso en el vientre (abdomen).  Se siente muy dbil.  Pierde el conocimiento (se desmaya).  Tiene escalofros.  Tiene una molestia repentina e inexplicable en el pecho.  Tiene molestias repentinas e [employer], la espalda, el cuello o la [place].  Le falta el aire en cualquier momento.  Comienza a sudar de [place] repentina o la piel se le humedece.  Siente nuseas.  Vomita.  Se siente repentinamente mareado o se desmaya.  Siente que el corazn comienza a latir rpidamente o que se saltea latidos. Estos sntomas pueden Customer service manager. No espere hasta que los sntomas desaparezcan. Solicite atencin mdica de inmediato. Comunquese con el servicio de emergencias de su localidad (911 en los [place]). No conduzca por sus propios medios [employer]. Esta informacin no tiene Theme park manager el consejo del mdico. Asegrese de hacerle al mdico cualquier pregunta que tenga. Document Released:  02/04/2009 Document Revised: 11/29/2014 Document Reviewed: 05/17/2016 Elsevier Interactive Patient Education  2017 [employer].

## 2017-04-20 ENCOUNTER — Encounter (HOSPITAL_COMMUNITY): Payer: Self-pay | Admitting: Emergency Medicine

## 2017-04-20 ENCOUNTER — Emergency Department (HOSPITAL_COMMUNITY)
Admission: EM | Admit: 2017-04-20 | Discharge: 2017-04-20 | Disposition: A | Discharge status: Home / Self Care | Payer: Self-pay | Attending: Dermatology | Admitting: Dermatology

## 2017-04-20 ENCOUNTER — Emergency Department (HOSPITAL_COMMUNITY): Payer: Self-pay

## 2017-04-20 ENCOUNTER — Inpatient Hospital Stay (HOSPITAL_COMMUNITY)
Admission: AD | Admit: 2017-04-20 | Discharge: 2017-04-20 | Disposition: A | Discharge status: Home / Self Care | Payer: Self-pay | Source: Ambulatory Visit | Attending: Obstetrics and Gynecology | Admitting: Obstetrics and Gynecology

## 2017-04-20 ENCOUNTER — Encounter (HOSPITAL_COMMUNITY): Payer: Self-pay | Admitting: *Deleted

## 2017-04-20 ENCOUNTER — Inpatient Hospital Stay (HOSPITAL_COMMUNITY): Payer: Self-pay

## 2017-04-20 DIAGNOSIS — R059 Cough, unspecified: Secondary | ICD-10-CM

## 2017-04-20 DIAGNOSIS — N760 Acute vaginitis: Secondary | ICD-10-CM | POA: Insufficient documentation

## 2017-04-20 DIAGNOSIS — R05 Cough: Secondary | ICD-10-CM | POA: Insufficient documentation

## 2017-04-20 DIAGNOSIS — F5102 Adjustment insomnia: Secondary | ICD-10-CM

## 2017-04-20 DIAGNOSIS — F418 Other specified anxiety disorders: Secondary | ICD-10-CM

## 2017-04-20 DIAGNOSIS — N390 Urinary tract infection, site not specified: Secondary | ICD-10-CM | POA: Insufficient documentation

## 2017-04-20 DIAGNOSIS — F419 Anxiety disorder, unspecified: Secondary | ICD-10-CM | POA: Insufficient documentation

## 2017-04-20 DIAGNOSIS — F329 Major depressive disorder, single episode, unspecified: Secondary | ICD-10-CM | POA: Insufficient documentation

## 2017-04-20 DIAGNOSIS — Z5321 Procedure and treatment not carried out due to patient leaving prior to being seen by health care provider: Secondary | ICD-10-CM | POA: Insufficient documentation

## 2017-04-20 LAB — CBC WITH DIFFERENTIAL/PLATELET
BASOS ABS: 0 10*3/uL (ref 0.0–0.1)
BASOS PCT: 0 %
Basophils Absolute: 0 10*3/uL (ref 0.0–0.1)
Basophils Relative: 0 %
EOS ABS: 0.6 10*3/uL (ref 0.0–0.7)
EOS ABS: 0.6 10*3/uL (ref 0.0–0.7)
EOS PCT: 10 %
EOS PCT: 9 %
HCT: 38.4 % (ref 36.0–46.0)
HCT: 39.9 % (ref 36.0–46.0)
HEMOGLOBIN: 12.7 g/dL (ref 12.0–15.0)
Hemoglobin: 13.7 g/dL (ref 12.0–15.0)
LYMPHS ABS: 2.4 10*3/uL (ref 0.7–4.0)
Lymphocytes Relative: 40 %
Lymphocytes Relative: 32 %
Lymphs Abs: 1.9 10*3/uL (ref 0.7–4.0)
MCH: 30.9 pg (ref 26.0–34.0)
MCH: 32 pg (ref 26.0–34.0)
MCHC: 33.1 g/dL (ref 30.0–36.0)
MCHC: 34.3 g/dL (ref 30.0–36.0)
MCV: 93.4 fL (ref 78.0–100.0)
MCV: 93.2 fL (ref 78.0–100.0)
MONO ABS: 0.2 10*3/uL (ref 0.1–1.0)
MONO ABS: 0.4 10*3/uL (ref 0.1–1.0)
MONOS PCT: 7 %
Monocytes Relative: 3 %
NEUTROS PCT: 43 %
Neutro Abs: 2.6 10*3/uL (ref 1.7–7.7)
Neutro Abs: 3.4 10*3/uL (ref 1.7–7.7)
Neutrophils Relative %: 56 %
PLATELETS: 219 10*3/uL (ref 150–400)
Platelets: 223 10*3/uL (ref 150–400)
RBC: 4.11 MIL/uL (ref 3.87–5.11)
RBC: 4.28 MIL/uL (ref 3.87–5.11)
RDW: 12.6 % (ref 11.5–15.5)
RDW: 13 % (ref 11.5–15.5)
WBC: 6 10*3/uL (ref 4.0–10.5)
WBC: 6.1 10*3/uL (ref 4.0–10.5)

## 2017-04-20 LAB — BASIC METABOLIC PANEL
Anion gap: 6 (ref 5–15)
BUN: 14 mg/dL (ref 6–20)
CALCIUM: 9 mg/dL (ref 8.9–10.3)
CHLORIDE: 106 mmol/L (ref 101–111)
CO2: 24 mmol/L (ref 22–32)
CREATININE: 0.62 mg/dL (ref 0.44–1.00)
GFR calc Af Amer: >60 mL/min (ref >60)
GFR calc non Af Amer: >60 mL/min (ref >60)
Glucose, Bld: 114 mg/dL — ABNORMAL HIGH (ref 65–99)
Potassium: 4 mmol/L (ref 3.5–5.1)
SODIUM: 136 mmol/L (ref 135–145)

## 2017-04-20 LAB — WET PREP, GENITAL
Sperm: NONE SEEN
Trich, Wet Prep: NONE SEEN
YEAST WET PREP: NONE SEEN

## 2017-04-20 LAB — URINALYSIS, ROUTINE W REFLEX MICROSCOPIC
BILIRUBIN URINE: NEGATIVE
BILIRUBIN URINE: NEGATIVE
GLUCOSE, UA: NEGATIVE mg/dL
Glucose, UA: NEGATIVE mg/dL
HGB URINE DIPSTICK: NEGATIVE
HGB URINE DIPSTICK: NEGATIVE
KETONES UR: NEGATIVE mg/dL
Ketones, ur: NEGATIVE mg/dL
Leukocytes, UA: NEGATIVE
NITRITE: NEGATIVE
Nitrite: NEGATIVE
PH: 7 (ref 5.0–8.0)
PROTEIN: NEGATIVE mg/dL
Protein, ur: NEGATIVE mg/dL
SPECIFIC GRAVITY, URINE: 1.016 (ref 1.005–1.030)
Specific Gravity, Urine: 1.021 (ref 1.005–1.030)
pH: 7 (ref 5.0–8.0)

## 2017-04-20 LAB — COMPREHENSIVE METABOLIC PANEL
ALBUMIN: 4.5 g/dL (ref 3.5–5.0)
ALT: 22 U/L (ref 14–54)
AST: 32 U/L (ref 15–41)
Alkaline Phosphatase: 90 U/L (ref 38–126)
Anion gap: 7 (ref 5–15)
BUN: 9 mg/dL (ref 6–20)
CHLORIDE: 105 mmol/L (ref 101–111)
CO2: 25 mmol/L (ref 22–32)
Calcium: 8.8 mg/dL — ABNORMAL LOW (ref 8.9–10.3)
Creatinine, Ser: 0.59 mg/dL (ref 0.44–1.00)
GFR calc Af Amer: >60 mL/min (ref >60)
Glucose, Bld: 87 mg/dL (ref 65–99)
POTASSIUM: 4.1 mmol/L (ref 3.5–5.1)
Sodium: 137 mmol/L (ref 135–145)
Total Bilirubin: 0.6 mg/dL (ref 0.3–1.2)
Total Protein: 8.2 g/dL — ABNORMAL HIGH (ref 6.5–8.1)

## 2017-04-20 LAB — I-STAT TROPONIN, ED: Troponin i, poc: 0 ng/mL (ref 0.00–0.08)

## 2017-04-20 LAB — I-STAT BETA HCG BLOOD, ED (MC, WL, AP ONLY): I-stat hCG, quantitative: <5 m[IU]/mL (ref <5)

## 2017-04-20 LAB — C-REACTIVE PROTEIN

## 2017-04-20 MED ORDER — SERTRALINE HCL 50 MG PO TABS: 50.0000 mg | ORAL_TABLET | Freq: Every day | ORAL | 2 refills | Status: DC

## 2017-04-20 MED ORDER — HYDROXYZINE HCL 25 MG PO TABS: 25.0000 mg | ORAL_TABLET | Freq: Three times a day (TID) | ORAL | 0 refills | Status: DC | PRN

## 2017-04-20 MED ORDER — METRONIDAZOLE 0.75 % VA GEL: 1.0000 | Freq: Every day | VAGINAL | 0 refills | Status: DC

## 2017-04-20 MED ORDER — METRONIDAZOLE 500 MG PO TABS: 500.0000 mg | ORAL_TABLET | Freq: Two times a day (BID) | ORAL | 0 refills | Status: DC

## 2017-04-20 MED ORDER — SULFAMETHOXAZOLE-TRIMETHOPRIM 800-160 MG PO TABS: 1.0000 | ORAL_TABLET | Freq: Two times a day (BID) | ORAL | 0 refills | Status: AC

## 2017-04-20 MED ORDER — LORAZEPAM 1 MG PO TABS: 1.0000 mg | ORAL_TABLET | Freq: Every day | ORAL | 0 refills | Status: DC

## 2017-04-20 NOTE — ED Triage Notes (Signed)
Patient reports persistent dry cough for several weeks , denies fever or chills , pt. added dysuria this week with mild hypogastric pain .

## 2017-04-20 NOTE — Discharge Instructions (Signed)
Bacterial Vaginosis Bacterial vaginosis is an infection of the vagina. It happens when too many germs (bacteria) grow in the vagina. This infection puts you at risk for infections from sex (STIs). Treating this infection can lower your risk for some STIs. You should also treat this if you are pregnant. It can cause your baby to be born early. Follow these instructions at home: Medicines  Take over-the-counter and prescription medicines only as told by your doctor.  Take or use your antibiotic medicine as told by your doctor. Do not stop taking or using it even if you start to feel better. General instructions  If you your sexual partner is a woman, tell her that you have this infection. She needs to get treatment if she has symptoms. If you have a female partner, he does not need to be treated.  During treatment: ? Avoid sex. ? Do not douche. ? Avoid alcohol as told. ? Avoid breastfeeding as told.  Drink enough fluid to keep your pee (urine) clear or pale yellow.  Keep your vagina and butt (rectum) clean. ? Wash the area with warm water every day. ? Wipe from front to back after you use the toilet.  Keep all follow-up visits as told by your doctor. This is important. Preventing this condition  Do not douche.  Use only warm water to wash around your vagina.  Use protection when you have sex. This includes: ? Latex condoms. ? Dental dams.  Limit how many people you have sex with. It is best to only have sex with the same person (be monogamous).  Get tested for STIs. Have your partner get tested.  Wear underwear that is cotton or lined with cotton.  Avoid tight pants and pantyhose. This is most important in summer.  Do not use any products that have nicotine or tobacco in them. These include cigarettes and e-cigarettes. If you need help quitting, ask your doctor.  Do not use illegal drugs.  Limit how much alcohol you drink. Contact a doctor if:  Your symptoms do not get  better, even after you are treated.  You have more discharge or pain when you pee (urinate).  You have a fever.  You have pain in your belly (abdomen).  You have pain with sex.  Your bleed from your vagina between periods. Summary  This infection happens when too many germs (bacteria) grow in the vagina.  Treating this condition can lower your risk for some infections from sex (STIs).  You should also treat this if you are pregnant. It can cause early (premature) birth.  Do not stop taking or using your antibiotic medicine even if you start to feel better. This information is not intended to replace advice given to you by your health care provider. Make sure you discuss any questions you have with your health care provider. Document Released: 08/17/2008 Document Revised: 07/24/2016 Document Reviewed: 07/24/2016 Elsevier Interactive Patient Education  2017 Elsevier Inc. Living With Anxiety After being diagnosed with an anxiety disorder, you may be relieved to know why you have felt or behaved a certain way. It is natural to also feel overwhelmed about the treatment ahead and what it will mean for your life. With care and support, you can manage this condition and recover from it. How to cope with anxiety Dealing with stress  Stress is your bodys reaction to life changes and events, both good and bad. Stress can last just a few hours or it can be ongoing. Stress can play  a major role in anxiety, so it is important to learn both how to cope with stress and how to think about it differently. Talk with your health care provider or a counselor to learn more about stress reduction. He or she may suggest some stress reduction techniques, such as:  Music therapy. This can include creating or listening to music that you enjoy and that inspires you.  Mindfulness-based meditation. This involves being aware of your normal breaths, rather than trying to control your breathing. It can be done  while sitting or walking.  Centering prayer. This is a kind of meditation that involves focusing on a word, phrase, or sacred image that is meaningful to you and that brings you peace.  Deep breathing. To do this, expand your stomach and inhale slowly through your nose. Hold your breath for 3-5 seconds. Then exhale slowly, allowing your stomach muscles to relax.  Self-talk. This is a skill where you identify thought patterns that lead to anxiety reactions and correct those thoughts.  Muscle relaxation. This involves tensing muscles then relaxing them. Choose a stress reduction technique that fits your lifestyle and personality. Stress reduction techniques take time and practice. Set aside 5-15 minutes a day to do them. Therapists can offer training in these techniques. The training may be covered by some insurance plans. Other things you can do to manage stress include:  Keeping a stress diary. This can help you learn what triggers your stress and ways to control your response.  Thinking about how you respond to certain situations. You may not be able to control everything, but you can control your reaction.  Making time for activities that help you relax, and not feeling guilty about spending your time in this way. Therapy combined with coping and stress-reduction skills provides the best chance for successful treatment. Medicines  Medicines can help ease symptoms. Medicines for anxiety include:  Anti-anxiety drugs.  Antidepressants.  Beta-blockers. Medicines may be used as the main treatment for anxiety disorder, along with therapy, or if other treatments are not working. Medicines should be prescribed by a health care provider. Relationships  Relationships can play a big part in helping you recover. Try to spend more time connecting with trusted friends and family members. Consider going to couples counseling, taking family education classes, or going to family therapy. Therapy can help  you and others better understand the condition. How to recognize changes in your condition Everyone has a different response to treatment for anxiety. Recovery from anxiety happens when symptoms decrease and stop interfering with your daily activities at home or work. This may mean that you will start to:  Have better concentration and focus.  Sleep better.  Be less irritable.  Have more energy.  Have improved memory. It is important to recognize when your condition is getting worse. Contact your health care provider if your symptoms interfere with home or work and you do not feel like your condition is improving. Where to find help and support: You can get help and support from these sources:  Self-help groups.  Online and [employer] Corporationcommunity organizations.  A trusted spiritual leader.  Couples counseling.  Family education classes.  Family therapy. Follow these instructions at home:  Eat a healthy diet that includes plenty of vegetables, fruits, whole grains, low-fat dairy products, and lean protein. Do not eat a lot of foods that are high in solid fats, added sugars, or salt.  Exercise. Most adults should do the following:  Exercise for at least 150  minutes each week. The exercise should increase your heart rate and make you sweat (moderate-intensity exercise).  Strengthening exercises at least twice a week.  Cut down on caffeine, tobacco, alcohol, and other potentially harmful substances.  Get the right amount and quality of sleep. Most adults need 7-9 hours of sleep each night.  Make choices that simplify your life.  Take over-the-counter and prescription medicines only as told by your health care provider.  Avoid caffeine, alcohol, and certain over-the-counter cold medicines. These may make you feel worse. Ask your pharmacist which medicines to avoid.  Keep all follow-up visits as told by your health care provider. This is important. Questions to ask your health care  provider  Would I benefit from therapy?  How often should I follow up with a health care provider?  How long do I need to take medicine?  Are there any long-term side effects of my medicine?  Are there any alternatives to taking medicine? Contact a health care provider if:  You have a hard time staying focused or finishing daily tasks.  You spend many hours a day feeling worried about everyday life.  You become exhausted by worry.  You start to have headaches, feel tense, or have nausea.  You urinate more than normal.  You have diarrhea. Get help right away if:  You have a racing heart and shortness of breath.  You have thoughts of hurting yourself or others. If you ever feel like you may hurt yourself or others, or have thoughts about taking your own life, get help right away. You can go to your nearest emergency department or call:  Your local emergency services (911 in the [place]  A suicide crisis helpline, such as the National Suicide Prevention Lifeline at 217 027 4965. This is open 24-hours a day. Summary  Taking steps to deal with stress can help calm you.  Medicines cannot cure anxiety disorders, but they can help ease symptoms.  Family, friends, and partners can play a big part in helping you recover from an anxiety disorder. This information is not intended to replace advice given to you by your health care provider. Make sure you discuss any questions you have with your health care provider. Document Released: 11/02/2016 Document Revised: 11/02/2016 Document Reviewed: 11/02/2016 Elsevier Interactive Patient Education  2017 [employer].

## 2017-04-20 NOTE — MAU Note (Signed)
Per lab, the courier did not deliver the requested tubes for a Quantum test, provider notified and will cancel the test and just draw the remaining labs.

## 2017-04-20 NOTE — MAU Provider Note (Signed)
Subjective:  Patient ID: Alexandra Hardy, female    DOB: 1991-04-28  Age: 26 y.o. MRN: [id number]  CC: No chief complaint on file.    Spanish interpretor in house and present at the time of HPI  HPI Alexandra Hardy is a 26 y.o. G1P1001 spanish speaking female who presents today with her significant other with complaints of  cough, abdominal pain, and painful urination. Patient reports that cough started about a month ago and denies any precipitating events such as allergies or URI. The cough is non productive and occurs multiple times per day. She states that she has a sore throat with the cough, and denies any difficulty swallowing. She denies fever ever accompanying the cough. She mentions she had one episode of SOB and chest tightness while at the gym 1 week ago. She denies ever having SOB or chest tightness before this. She went to the ED last night and left prior to being seen, she did have a chest xray that was normal.   She reports that she has had abdominal pain for about 2 weeks now. Patient says that the pain is mostly brought on by coughing and is tender to touch. The pain radiates down to her right leg.   Patient mentions that she has painful urination since Monday. She states that it burns and itches to pee. She also reports frequent urination and urgency to pee. Accompanying this she has white vaginal discharge with a fishy odor. She denies any blood in urine or vaginal bleeding  Patient endorses weight loss, night sweats, generalized body aches, nausea and vomiting.   Past Medical History:  Diagnosis Date  . Gestational diabetes   . History of HPV infection     No Known Allergies  Prescriptions Prior to Admission  Medication Sig Dispense Refill Last Dose  . LORazepam (ATIVAN) 1 MG tablet Take 1 tablet (1 mg total) by mouth 3 (three) times daily as needed for anxiety. (Patient not taking: Reported on 02/25/2017) 5 tablet 0 Not Taking  . norethindrone  (MICRONOR,CAMILA,ERRIN) 0.35 MG tablet Take 1 tablet (0.35 mg total) by mouth daily. (Patient not taking: Reported on 02/25/2017) 1 Package 11 Not Taking  . Prenatal Vit-Fe Fumarate-FA (PRENATAL VITAMIN PO) Take 1 tablet by mouth daily.    Taking    Past Surgical History:  Procedure Laterality Date  . NO PAST SURGERIES      OB History    Gravida Para Term Preterm AB Living   1 1 1     1    SAB TAB Ectopic Multiple Live Births         0 1      Family History  Problem Relation Age of Onset  . Diabetes Father     Social History  Substance Use Topics  . Smoking status: Never Smoker  . Smokeless tobacco: Never Used  . Alcohol use No   Patient lives with her husband, her daughter, her sister, her sister's husband and their 3 kids. She is the one that stays at home and takes care of all the kids. Both herself and the members of the household have not traveled outside the country. Her daughter has recently been sick with diarrhea and vomiting.   ROS Review of Systems  Constitutional: Positive for diaphoresis, fatigue and unexpected weight change. Negative for fever.  HENT: Positive for sore throat. Negative for trouble swallowing.   Respiratory: Positive for cough, chest tightness and shortness of breath.   Cardiovascular: Negative for leg swelling.  Gastrointestinal: Positive for abdominal pain and nausea. Negative for vomiting.  Genitourinary: Positive for dysuria, urgency and vaginal discharge.  Musculoskeletal: Positive for arthralgias.  Neurological: Negative for dizziness and headaches.    Objective:   Today's Vitals: BP 106/73 (BP Location: Right Arm)   Pulse 79   Temp 98.5 F (36.9 C) (Oral)   Resp 16   Ht 5\' 2"  (1.575 m)   Wt 57.2 kg (126 lb)   LMP 02/24/2017   SpO2 100%   BMI 23.05 kg/m   Physical Exam  Constitutional: She is oriented to person, place, and time. She appears well-developed and well-nourished.  Frequent, dry cough noted.   HENT:  Head:  Normocephalic and atraumatic.  Mouth/Throat: Uvula is midline, oropharynx is clear and moist and mucous membranes are normal.  Cardiovascular: Normal rate and regular rhythm.  Exam reveals no gallop and no friction rub.   No murmur heard. Pulmonary/Chest: Effort normal. No respiratory distress. She has no wheezes. She has no rales.  Abdominal: Soft. Normal appearance. There is generalized tenderness. There is no rebound.  Genitourinary:  Genitourinary Comments: Bimanual exam: Cervix closed, no CMT  Uterus non tender, normal size Adnexa non tender, no masses bilaterally wet prep done Chaperone present for exam.   Musculoskeletal: Normal range of motion. She exhibits no edema.  Neurological: She is alert and oriented to person, place, and time.  Skin: Skin is warm and dry.    Results for orders placed or performed during the hospital encounter of 04/20/17 (from the past 24 hour(s))  Urinalysis, Routine w reflex microscopic   Collection Time: 04/20/17  3:00 PM  Result Value Ref Range   Color, Urine YELLOW YELLOW   APPearance HAZY (A) CLEAR   Specific Gravity, Urine 1.021 1.005 - 1.030   pH 7.0 5.0 - 8.0   Glucose, UA NEGATIVE NEGATIVE mg/dL   Hgb urine dipstick NEGATIVE NEGATIVE   Bilirubin Urine NEGATIVE NEGATIVE   Ketones, ur NEGATIVE NEGATIVE mg/dL   Protein, ur NEGATIVE NEGATIVE mg/dL   Nitrite NEGATIVE NEGATIVE   Leukocytes, UA TRACE (A) NEGATIVE   RBC / HPF 0-5 0 - 5 RBC/hpf   WBC, UA 0-5 0 - 5 WBC/hpf   Bacteria, UA RARE (A) NONE SEEN   Squamous Epithelial / LPF 6-30 (A) NONE SEEN   Mucous PRESENT   Results for orders placed or performed during the hospital encounter of 04/20/17 (from the past 24 hour(s))  CBC with Differential   Collection Time: 04/20/17 12:33 AM  Result Value Ref Range   WBC 6.0 4.0 - 10.5 K/uL   RBC 4.11 3.87 - 5.11 MIL/uL   Hemoglobin 12.7 12.0 - 15.0 g/dL   HCT 47.8 29.5 - 62.1 %   MCV 93.4 78.0 - 100.0 fL   MCH 30.9 26.0 - 34.0 pg   MCHC 33.1  30.0 - 36.0 g/dL   RDW 30.8 65.7 - 84.6 %   Platelets 223 150 - 400 K/uL   Neutrophils Relative % 43 %   Neutro Abs 2.6 1.7 - 7.7 K/uL   Lymphocytes Relative 40 %   Lymphs Abs 2.4 0.7 - 4.0 K/uL   Monocytes Relative 7 %   Monocytes Absolute 0.4 0.1 - 1.0 K/uL   Eosinophils Relative 10 %   Eosinophils Absolute 0.6 0.0 - 0.7 K/uL   Basophils Relative 0 %   Basophils Absolute 0.0 0.0 - 0.1 K/uL  Basic metabolic panel   Collection Time: 04/20/17 12:33 AM  Result Value Ref Range  Sodium 136 135 - 145 mmol/L   Potassium 4.0 3.5 - 5.1 mmol/L   Chloride 106 101 - 111 mmol/L   CO2 24 22 - 32 mmol/L   Glucose, Bld 114 (H) 65 - 99 mg/dL   BUN 14 6 - 20 mg/dL   Creatinine, Ser 4.69 0.44 - 1.00 mg/dL   Calcium 9.0 8.9 - 62.9 mg/dL   GFR calc non Af Amer >60 >60 mL/min   GFR calc Af Amer >60 >60 mL/min   Anion gap 6 5 - 15  Urinalysis, Routine w reflex microscopic   Collection Time: 04/20/17 12:37 AM  Result Value Ref Range   Color, Urine YELLOW YELLOW   APPearance CLEAR CLEAR   Specific Gravity, Urine 1.016 1.005 - 1.030   pH 7.0 5.0 - 8.0   Glucose, UA NEGATIVE NEGATIVE mg/dL   Hgb urine dipstick NEGATIVE NEGATIVE   Bilirubin Urine NEGATIVE NEGATIVE   Ketones, ur NEGATIVE NEGATIVE mg/dL   Protein, ur NEGATIVE NEGATIVE mg/dL   Nitrite NEGATIVE NEGATIVE   Leukocytes, UA NEGATIVE NEGATIVE  I-stat troponin, ED   Collection Time: 04/20/17 12:53 AM  Result Value Ref Range   Troponin i, poc 0.00 0.00 - 0.08 ng/mL   Comment 3          I-Stat Beta hCG blood, ED ([facility], [facility], [facility] only)   Collection Time: 04/20/17  1:09 AM  Result Value Ref Range   I-stat hCG, quantitative <5.0 <5 mIU/mL   Comment 3            Assessment & Plan:   Assessment: Patient Active Problem List   Diagnosis Date Noted  . Atypical chest pain 02/25/2017  . Upper back pain on left side 02/25/2017  . Musculoskeletal pain 02/25/2017  . PROM (premature rupture of membranes) 09/30/2015  . NSVD (normal spontaneous  vaginal delivery) 09/29/2015  . Labor and delivery indication for care or intervention 09/28/2015  . Active labor 09/28/2015  . Gestational diabetes mellitus, antepartum 08/04/2015  . Pap smear of cervix shows high risk HPV present 05/14/2015  . Supervision of high-risk pregnancy 03/12/2015  . Language barrier affecting health care 03/12/2015    Alexandra Hardy is a 25 y.o. female with +UA, +Wet Prep, and multiple somatic complaints on review of systems and frequent visits to other departments, here with fatigue, poor sleep, month long cough, abdominal pain, and painful urination. Given picture, predominating diagnosis is anxiety and depression. Based on lab results, se has both UTI and BV. We will treat both.    Plan:  #Depression and Anxiety; may have excalated from delivery of child 1.5 years ago.  - Zoloft for Depression - Atarax for Anxiety - Xanax # 6 no refill  - Follow up with outpatient family medicine provider; contact info given  - Offered counseling services   #Urinary Tract Infection - Treating with Bactrim for 3 days  #Bacterial Vaginosis - Treating with Metrogel  Follow-up: No Follow-up on file.  Return to MAU if symptoms worsen   Ivan Anchors [facility] Medical Student 04/20/2017 6:02 PM    I confirm that I have verified the information documented in the medical students note and that I have also personally performed the physical exam and all medical decision making activities.   Duane Lope, NP 04/20/2017 8:23 PM

## 2017-04-20 NOTE — MAU Note (Signed)
Pt reports she saw a MD yesterday and they told her she may be pregnant but today she started having right lower quad pain and some bleeding.

## 2017-04-21 LAB — HIV ANTIBODY (ROUTINE TESTING W REFLEX): HIV SCREEN 4TH GENERATION: NONREACTIVE

## 2017-08-30 ENCOUNTER — Encounter: Payer: Self-pay | Admitting: Urgent Care

## 2017-08-30 ENCOUNTER — Ambulatory Visit (INDEPENDENT_AMBULATORY_CARE_PROVIDER_SITE_OTHER): Payer: Self-pay | Admitting: Urgent Care

## 2017-08-30 ENCOUNTER — Ambulatory Visit: Payer: Self-pay | Admitting: Urgent Care

## 2017-08-30 VITALS — BP 110/64 | HR 71 | Temp 98.2°F | Resp 16 | Ht 62.0 in | Wt 128.0 lb

## 2017-08-30 DIAGNOSIS — Z3A01 Less than 8 weeks gestation of pregnancy: Secondary | ICD-10-CM

## 2017-08-30 DIAGNOSIS — N926 Irregular menstruation, unspecified: Secondary | ICD-10-CM

## 2017-08-30 LAB — POCT URINE PREGNANCY: Preg Test, Ur: POSITIVE — AB

## 2017-08-30 MED ORDER — VITAMIN B-6 25 MG PO TABS: 25.0000 mg | ORAL_TABLET | Freq: Every day | ORAL | 1 refills | Status: DC

## 2017-08-30 NOTE — Progress Notes (Signed)
MRN: [id number] DOB: 04-05-1991  Subjective:   Alexandra Hardy is a 26 y.o. female presenting for chief complaint of Possible Pregnancy (no period in almost 2 months)  Patient is presenting to test for pregnancy. LMP was 07/06/2017. Denies fever, vaginal discharge, vaginal bleeding, pelvic pain. Has had intermittent nausea without vomiting, occasional belly cramping. She presented to [facility] but was advised to come here for UPT first.  Alexandra Hardy is currently taking multi-vitamin. Also has No Known Allergies.  Alexandra Hardy  has a past medical history of Gestational diabetes and History of HPV infection. Also  has a past surgical history that includes No past surgeries.  Objective:   Vitals: BP 110/64   Pulse 71   Temp 98.2 F (36.8 C) (Oral)   Resp 16   Ht 5\' 2"  (1.575 m)   Wt 128 lb (58.1 kg)   LMP 06/27/2017   SpO2 98%   BMI 23.41 kg/m   Physical Exam  Constitutional: She is oriented to person, place, and time. She appears well-developed and well-nourished.  Cardiovascular: Normal rate, regular rhythm and intact distal pulses.  Exam reveals no gallop and no friction rub.   No murmur heard. Pulmonary/Chest: No respiratory distress. She has no wheezes. She has no rales.  Abdominal: Soft. Bowel sounds are normal. She exhibits no distension and no mass. There is no tenderness. There is no guarding.  Neurological: She is alert and oriented to person, place, and time.  Skin: Skin is warm and dry.   Results for orders placed or performed in visit on 08/30/17 (from the past 24 hour(s))  POCT urine pregnancy     Status: Abnormal   Collection Time: 08/30/17  2:36 PM  Result Value Ref Range   Preg Test, Ur Positive (A) Negative   Assessment and Plan :   1. Less than [redacted] weeks gestation of pregnancy 2. Missed period - Advised patient to continue pre-natal vitamin. Start pyridoxine for nausea. Follow up as needed.  Wallis Bamberg, PA-C Primary Care at [facility] [phone] 08/30/2017  2:54 PM

## 2017-08-30 NOTE — Patient Instructions (Addendum)
Primer trimestre de Media planner (First Trimester of Pregnancy) El primer trimestre de Media planner se extiende desde la semana1 hasta el final de la semana12 (mes1 al mes3). Durante este tiempo, el beb comenzar a desarrollarse dentro suyo. Entre la semana6 y [place], se forman los ojos y [place], y los latidos del corazn pueden verse en la ecografa. Al final de las 12semanas, todos los rganos del beb estn formados. La atencin prenatal es toda la asistencia mdica que usted recibe antes del nacimiento del beb. Asegrese de recibir una buena atencin prenatal y de seguir todas las indicaciones del mdico. CUIDADOS EN EL HOGAR Medicamentos:  Tome los medicamentos solamente como se lo haya indicado el mdico. Algunos medicamentos se pueden tomar durante el Media planner y otros no.  Tome las vitaminas prenatales como se lo haya indicado el [place] medicamento que la ayuda a Landscape architect (laxante [place]) segn sea necesario, si el mdico lo autoriza. Dieta  Ingiera alimentos saludables de [place] regular.  El Buyer, retail la cantidad de peso que [place].  No coma carne cruda ni quesos sin cocinar.  Si tiene Higher education careers adviser (nuseas) o vomita: ? Ingiera 4 o 5comidas pequeas por TEFL teacher de 3abundantes. ? Intente comer algunas galletitas saladas. ? Beba lquidos DTE Energy Company, en lugar de Frontier Oil Corporation.  Si tiene dificultad para defecar (estreimiento): ? Consuma alimentos con alto contenido de [place], como verduras y frutas frescos, y Psychologist, prison and probation services. ? Beba suficiente lquido para mantener el pis (orina) claro o de color amarillo plido. Actividad y ejercicios  Haga ejercicios solamente como se lo haya indicado el mdico. Deje de hacer ejercicios si tiene clicos o dolor en la parte baja del vientre (abdomen) o en la cintura.  Intente no estar de pie Tech Data Corporation. Mueva las piernas con frecuencia si debe estar de pie en un lugar durante  mucho tiempo.  Evite levantar pesos EMCOR.  Use zapatos con tacones bajos. Mantenga una buena postura al sentarse y pararse.  Puede tener [employer] Depot, a menos que el mdico le indique lo contrario. Alivio del [place] molestias  Use un sostn que le brinde buen soporte si le duelen las [place].  Dese baos con agua tibia (baos de asiento) para Best boy o las molestias a causa de las hemorroides. Use crema antihemorroidal si el mdico se lo permite.  Descanse con las piernas elevadas si tiene calambres o dolor de cintura.  Use medias de descanso si tiene las venas de las piernas hinchadas y abultadas (venas varicosas). Eleve los pies durante 35minutos, 3 o 4veces por da. Limite la cantidad de sal en su dieta. Cuidados prenatales  Programe las visitas prenatales para la semana12 de [place].  Escriba sus preguntas. Llvelas cuando concurra a las visitas prenatales.  Concurra a todas las visitas prenatales como se lo haya indicado el mdico. Seguridad  Colquese el cinturn de seguridad cuando conduzca.  Haga una lista con los nmeros de telfono en caso de Freight forwarder, en la cual deben incluirse los nmeros de los familiares, los amigos, el hospital y los departamentos de polica y de bomberos. Consejos generales  Pdale al mdico que la derive a clases prenatales en su localidad. Debe comenzar a tomar las clases antes de Dietitian en el mes6 de embarazo.  Pida ayuda si necesita asesoramiento o asistencia con la alimentacin. El mdico puede aconsejarla o indicarle dnde recurrir para recibir [place].  No se d baos de inmersin en agua caliente, baos  turcos ni saunas.  No se haga duchas vaginales ni use tampones o toallas higinicas perfumadas.  No mantenga las piernas cruzadas durante [place].  Evite el contacto con las bandejas sanitarias de los gatos y la tierra que estos animales usan.  No fume, no consuma hierbas ni beba alcohol. No tome  frmacos que el mdico no haya autorizado.  No consuma ningn producto que contenga tabaco, lo que incluye cigarrillos, tabaco de Theatre manager o Administrator, Civil Service. Si necesita ayuda para dejar de fumar, consulte al American Express. Puede recibir asesoramiento u otro tipo de apoyo para dejar de fumar.  Visite al dentista. En su casa, lvese los dientes con un cepillo dental suave. Psese el hilo dental con suavidad. SOLICITE AYUDA SI:  Tiene mareos.  Tiene clicos leves o siente presin en la parte baja del vientre.  Siente un dolor persistente en la zona del vientre.  Sigue teniendo AT&T, vomita o las heces son lquidas (diarrea).  Observa una secrecin, con mal olor que proviene de la vagina.  Siente dolor al ConocoPhillips.  Tiene el rostro, las [place], las piernas o los tobillos ms hinchados (inflamados).  SOLICITE AYUDA DE INMEDIATO SI:  Tiene fiebre.  Tiene una prdida de lquido por la vagina.  Tiene sangrado o pequeas prdidas vaginales.  Tiene clicos o dolor muy intensos en el vientre.  Sube o baja de peso rpidamente.  Vomita sangre. Puede ser similar a la borra del caf  Est en contacto con personas que tienen rubola, la quinta enfermedad o varicela.  Siente un dolor de cabeza muy intenso.  Le falta el aire.  Sufre cualquier tipo de traumatismo, por ejemplo, debido a una cada o un accidente automovilstico.  Esta informacin no tiene Theme park manager el consejo del mdico. Asegrese de hacerle al mdico cualquier pregunta que tenga. Document Released: 02/04/2009 Document Revised: 11/29/2014 Document Reviewed: 09/18/2013 Elsevier Interactive Patient Education  2017 Elsevier Inc.    Pyridoxine, Vitamin B6 tablets Qu es este medicamento? La PIRIDOXINA es una vitamina B6. Se combina con una dieta saludable para prevenir o tratar los niveles bajos de vitamina B6. Este medicamento puede ser utilizado para otros usos; si tiene alguna pregunta consulte  con su proveedor de atencin mdica o con su farmacutico. MARCAS COMUNES: [id number] Qu le debo informar a mi profesional de la salud antes de tomar este medicamento? Necesita saber si usted presenta alguno de los [employer] o situaciones: -enfermedad cardiaca -una reaccin alrgica o inusual a las vitaminas B, otros medicamentos, alimentos, colorantes o conservantes -si est embarazada o buscando quedar embarazada -si est amamantando a un beb Cmo debo utilizar este medicamento? Tome este medicamento por va oral con un vaso de agua. Siga las instrucciones de la etiqueta o del paquete del [place]. Para obtener los Safeco Corporation, tome esta vitamina con alimentos. Tome sus dosis a intervalos regulares. No tome su medicamento con una frecuencia mayor a la indicada. Hable con su pediatra para informarse acerca del uso de este medicamento en nios. Aunque este medicamento se puede recetar para condiciones selectivas, las precauciones se aplican. Sobredosis: Pngase en contacto inmediatamente con un centro toxicolgico o una sala de urgencia si usted cree que haya tomado demasiado medicamento. ATENCIN: Reynolds American es solo para usted. No comparta este medicamento con nadie. Qu sucede si me olvido de una dosis? Si olvida una dosis, tmela lo antes posible. Si es casi la hora de la prxima dosis, tome slo esa dosis. No tome dosis adicionales o dobles. Qu puede interactuar  con este medicamento? No tome esta medicina con ninguno de los siguientes medicamentos: -levodopa Esta medicina tambin puede interactuar con los siguientes medicamentos: -algunos medicamentos para el tratamiento de cncer Puede ser que esta lista no menciona todas las posibles interacciones. Informe a su profesional de Beazer Homes de Ingram Micro Inc productos a base de hierbas, medicamentos de [place] o suplementos nutritivos que est tomando. Si usted fuma, consume bebidas alcohlicas o si utiliza  drogas ilegales, indqueselo tambin a su profesional de Beazer Homes. Algunas sustancias pueden interactuar con su medicamento. A qu debo estar atento al usar PPL Corporation? Asegrese de seguir Naval architect. El tomar un suplemento de vitamina no elimina la necesidad de [place] dieta equilibrada. Algunos alimentos que contienen esta vitamina naturalmente son frijoles, cereales, vegetales, hgado, carne y huevos. El tomar demasiado de esta vitamina puede ser peligroso. Consulte a su mdico o su proveedor de atencin Masco Corporation cantidad [place] para usted. Qu efectos secundarios puedo tener al Boston Scientific este medicamento? Efectos secundarios que debe informar a su mdico o a Producer, television/film/video de la salud tan pronto como sea posible: Therapist, art, como erupcin cutnea, picazn o urticarias, e hinchazn de la cara, los labios o Camera operator, hormigueo o entumecimiento de las manos o los pies convulsiones Efectos secundarios que generalmente no requieren atencin mdica (infrmelos a su mdico o a Producer, television/film/video de la salud si persisten o si son molestos): dolor de Secretary/administrator Puede ser que esta lista no menciona todos los posibles efectos secundarios. Comunquese a su mdico por asesoramiento mdico Hewlett-Packard. Usted puede informar los efectos secundarios a la FDA por telfono al 1-800-FDA-1088. Dnde debo guardar mi medicina? Mantngala fuera del alcance de los nios. Gurdela a Sanmina-SCI, entre 15 y 30 grados C (58 y 55 grados F). Protjala de la luz. Deseche los medicamentos que no haya utilizado, despus de la fecha de vencimiento. ATENCIN: Este folleto es un resumen. Puede ser que no cubra toda la posible informacin. Si usted tiene preguntas acerca de esta medicina, consulte con su mdico, su farmacutico o su profesional de Radiographer, therapeutic.  2018 Elsevier/Gold Standard (2016-02-23 00:00:00)     IF you received an x-ray today, you will  receive an invoice from [facility]. Please contact [facility] at [phone] with questions or concerns regarding your invoice.   IF you received labwork today, you will receive an invoice from [facility]. Please contact [facility] at [phone] with questions or concerns regarding your invoice.   Our billing staff will not be able to assist you with questions regarding bills from these companies.  You will be contacted with the lab results as soon as they are available. The fastest way to get your results is to activate your My Chart account. Instructions are located on the last page of this paperwork. If you have not heard from [place] regarding the results in 2 weeks, please contact this office.

## 2017-09-02 ENCOUNTER — Encounter: Payer: Self-pay | Admitting: Medical

## 2017-09-02 ENCOUNTER — Inpatient Hospital Stay (HOSPITAL_COMMUNITY): Payer: Self-pay

## 2017-09-02 ENCOUNTER — Inpatient Hospital Stay (HOSPITAL_COMMUNITY)
Admission: AD | Admit: 2017-09-02 | Discharge: 2017-09-02 | Disposition: A | Discharge status: Home / Self Care | Payer: Self-pay | Source: Ambulatory Visit | Attending: Obstetrics and Gynecology | Admitting: Obstetrics and Gynecology

## 2017-09-02 DIAGNOSIS — Z3A01 Less than 8 weeks gestation of pregnancy: Secondary | ICD-10-CM | POA: Insufficient documentation

## 2017-09-02 DIAGNOSIS — O209 Hemorrhage in early pregnancy, unspecified: Secondary | ICD-10-CM

## 2017-09-02 DIAGNOSIS — O26891 Other specified pregnancy related conditions, first trimester: Secondary | ICD-10-CM | POA: Insufficient documentation

## 2017-09-02 DIAGNOSIS — Z8632 Personal history of gestational diabetes: Secondary | ICD-10-CM | POA: Insufficient documentation

## 2017-09-02 DIAGNOSIS — R109 Unspecified abdominal pain: Secondary | ICD-10-CM

## 2017-09-02 DIAGNOSIS — Z833 Family history of diabetes mellitus: Secondary | ICD-10-CM | POA: Insufficient documentation

## 2017-09-02 DIAGNOSIS — Z79899 Other long term (current) drug therapy: Secondary | ICD-10-CM | POA: Insufficient documentation

## 2017-09-02 DIAGNOSIS — R1030 Lower abdominal pain, unspecified: Secondary | ICD-10-CM | POA: Insufficient documentation

## 2017-09-02 DIAGNOSIS — N898 Other specified noninflammatory disorders of vagina: Secondary | ICD-10-CM | POA: Insufficient documentation

## 2017-09-02 LAB — URINALYSIS, ROUTINE W REFLEX MICROSCOPIC
Bilirubin Urine: NEGATIVE
Glucose, UA: NEGATIVE mg/dL
Hgb urine dipstick: NEGATIVE
Ketones, ur: NEGATIVE mg/dL
Nitrite: NEGATIVE
PROTEIN: NEGATIVE mg/dL
SPECIFIC GRAVITY, URINE: 1.01 (ref 1.005–1.030)
pH: 6 (ref 5.0–8.0)

## 2017-09-02 LAB — CBC WITH DIFFERENTIAL/PLATELET
BASOS ABS: 0 10*3/uL (ref 0.0–0.1)
Basophils Relative: 0 %
EOS PCT: 3 %
Eosinophils Absolute: 0.2 10*3/uL (ref 0.0–0.7)
HCT: 37.1 % (ref 36.0–46.0)
HEMOGLOBIN: 13.1 g/dL (ref 12.0–15.0)
LYMPHS ABS: 1.9 10*3/uL (ref 0.7–4.0)
Lymphocytes Relative: 29 %
MCH: 32.4 pg (ref 26.0–34.0)
MCHC: 35.3 g/dL (ref 30.0–36.0)
MCV: 91.8 fL (ref 78.0–100.0)
Monocytes Absolute: 0.2 10*3/uL (ref 0.1–1.0)
Monocytes Relative: 3 %
NEUTROS ABS: 4.2 10*3/uL (ref 1.7–7.7)
NEUTROS PCT: 65 %
PLATELETS: 217 10*3/uL (ref 150–400)
RBC: 4.04 MIL/uL (ref 3.87–5.11)
RDW: 12.7 % (ref 11.5–15.5)
WBC: 6.5 10*3/uL (ref 4.0–10.5)

## 2017-09-02 LAB — WET PREP, GENITAL
CLUE CELLS WET PREP: NONE SEEN
Sperm: NONE SEEN
TRICH WET PREP: NONE SEEN
Yeast Wet Prep HPF POC: NONE SEEN

## 2017-09-02 LAB — HCG, QUANTITATIVE, PREGNANCY: hCG, Beta Chain, Quant, S: 106107 m[IU]/mL — ABNORMAL HIGH (ref <5)

## 2017-09-02 NOTE — MAU Note (Signed)
Having abd pain and brown discharge. Have sore area on belly button. Pain for 7 days with brown d/c.

## 2017-09-02 NOTE — Discharge Instructions (Signed)
Dolor abdominal durante el embarazo (Abdominal Pain During Pregnancy) El dolor de vientre (abdominal) es habitual durante el embarazo. Generalmente no se trata de un problema grave. Otras veces puede ser un signo de que algo no anda bien. Siempre comunquese con su mdico si tiene dolor abdominal. CUIDADOS EN EL HOGAR Controle el dolor para ver si hay cambios. Las indicaciones que siguen pueden ayudarla a sentirse mejor:  Notenga sexo (relaciones sexuales) ni se coloque nada dentro de la vagina hasta que se sienta mejor.  Haga reposo hasta que el dolor se calme.  Si siente ganas de vomitar (nuseas ) beba lquidos claros. No consuma alimentos slidos hasta que se sienta mejor.  Slo tome los medicamentos que le haya indicado su mdico.  Cumpla con las visitas al mdico segn las indicaciones. SOLICITE AYUDA DE INMEDIATO SI:  Tiene un sangrado, pierde lquido o elimina trozos de tejido por la vagina.  Siente ms dolor o clicos.  Comienza a vomitar.  Siente dolor al orinar u observa sangre en la orina.  Tiene fiebre.  No siente que el beb se mueva mucho.  Se siente muy dbil o cree que va a desmayarse.  Tiene dificultad para respirar con o sin dolor en el vientre.  Siente un dolor de cabeza muy intenso y dolor en el vientre.  Observa que sale un lquido por la vagina y tiene dolor abdominal.  La materia fecal es lquida (diarrea).  El dolor en el viente no desaparece, o empeora, luego de hacer reposo. ASEGRESE DE QUE:  Comprende estas instrucciones.  Controlar su afeccin.  Recibir ayuda de inmediato si no mejora o si empeora. Esta informacin no tiene como fin reemplazar el consejo del mdico. Asegrese de hacerle al mdico cualquier pregunta que tenga. Document Released: 07/21/2011 Document Revised: 03/01/2016 Document Reviewed: 06/07/2013 Elsevier Interactive Patient Education  2018 Elsevier Inc.  

## 2017-09-02 NOTE — MAU Provider Note (Signed)
History     CSN: [id number]  Arrival date and time: 09/02/17 2004   First Provider Initiated Contact with Patient 09/02/17 2038      Chief Complaint  Patient presents with  . Abdominal Pain  . Vaginal Discharge   HPI [name] is a 26 y.o. G2P1001 at [redacted]w[redacted]d who presents to MAU today with complaint of abdominal pain and brown discharge. The patient states lower abdominal pain x 1 week. She noted brown discharge starting on Monday. She states that she takes Tylenol PRN for pain with some relief. She has had nausea without vomiting or diarrhea. She denies fever.   OB History    Gravida Para Term Preterm AB Living   SAB TAB Ectopic Multiple Live Births         0 1      Past Medical History:  Diagnosis Date  . Gestational diabetes   . History of HPV infection     Past Surgical History:  Procedure Laterality Date  . NO PAST SURGERIES      Family History  Problem Relation Age of Onset  . Diabetes Father     Social History  Substance Use Topics  . Smoking status: Never Smoker  . Smokeless tobacco: Never Used  . Alcohol use No    Allergies: No Known Allergies  Prescriptions Prior to Admission  Medication Sig Dispense Refill Last Dose  . hydrOXYzine (ATARAX/VISTARIL) 25 MG tablet Take 1 tablet (25 mg total) by mouth every 8 (eight) hours as needed. 15 tablet 0 Taking  . LORazepam (ATIVAN) 1 MG tablet Take 1 tablet (1 mg total) by mouth at bedtime. 6 tablet 0 Taking  . metroNIDAZOLE (METROGEL VAGINAL) 0.75 % vaginal gel Place 1 Applicatorful vaginally at bedtime. 70 g 0 Taking  . norethindrone (MICRONOR,CAMILA,ERRIN) 0.35 MG tablet Take 1 tablet (0.35 mg total) by mouth daily. 1 Package 11 Taking  . Prenatal Vit-Fe Fumarate-FA (PRENATAL VITAMIN PO) Take 1 tablet by mouth daily.    Taking  . sertraline (ZOLOFT) 50 MG tablet Take 1 tablet (50 mg total) by mouth daily. 30 tablet 2 Taking  . vitamin B-6 (PYRIDOXINE) 25 MG tablet Take 1 tablet  (25 mg total) by mouth daily. 90 tablet 1     Review of Systems  Constitutional: Negative for fever.  Gastrointestinal: Positive for abdominal pain and nausea. Negative for constipation, diarrhea and vomiting.  Genitourinary: Positive for pelvic pain, vaginal bleeding and vaginal discharge.   Physical Exam   Blood pressure 109/65, pulse 70, temperature 97.9 F (36.6 C), resp. rate 16, height  (1.575 m), weight 128 lb (58.1 kg), last menstrual period 06/27/2017, currently breastfeeding.  Physical Exam  Nursing note and vitals reviewed. Constitutional: She is oriented to person, place, and time. She appears well-developed and well-nourished. No distress.  HENT:  Head: Normocephalic and atraumatic.  Cardiovascular: Normal rate.   Respiratory: Effort normal.  GI: Soft. She exhibits no distension and no mass. There is tenderness (mild suprapubic). There is no rebound and no guarding.  Genitourinary: Uterus is enlarged (slightly) and tender (mild). Cervix exhibits no motion tenderness, no discharge and no friability. Right adnexum displays tenderness (mild). Right adnexum displays no mass. Left adnexum displays tenderness (mild). Left adnexum displays no mass. There is bleeding (scant, light brown) in the vagina. Vaginal discharge (mucous) found.  Neurological: She is alert and oriented to person, place, and time.  Skin: Skin is warm and  dry. No erythema.  Psychiatric: She has a normal mood and affect.     Results for orders placed or performed during the hospital encounter of 09/02/17 (from the past 24 hour(s))  Urinalysis, Routine w reflex microscopic     Status: Abnormal   Collection Time: 09/02/17  8:22 PM  Result Value Ref Range   Color, Urine STRAW (A) YELLOW   APPearance CLEAR CLEAR   Specific Gravity, Urine 1.010 1.005 - 1.030   pH 6.0 5.0 - 8.0   Glucose, UA NEGATIVE NEGATIVE mg/dL   Hgb urine dipstick NEGATIVE NEGATIVE   Bilirubin Urine NEGATIVE NEGATIVE   Ketones, ur  NEGATIVE NEGATIVE mg/dL   Protein, ur NEGATIVE NEGATIVE mg/dL   Nitrite NEGATIVE NEGATIVE   Leukocytes, UA SMALL (A) NEGATIVE   RBC / HPF 0-5 0 - 5 RBC/hpf   WBC, UA 0-5 0 - 5 WBC/hpf   Bacteria, UA RARE (A) NONE SEEN   Squamous Epithelial / LPF 6-30 (A) NONE SEEN   Mucus PRESENT   CBC with Differential/Platelet     Status: None   Collection Time: 09/02/17  8:24 PM  Result Value Ref Range   WBC 6.5 4.0 - 10.5 K/uL   RBC 4.04 3.87 - 5.11 MIL/uL   Hemoglobin 13.1 12.0 - 15.0 g/dL   HCT 30.8 65.7 - 84.6 %   MCV 91.8 78.0 - 100.0 fL   MCH 32.4 26.0 - 34.0 pg   MCHC 35.3 30.0 - 36.0 g/dL   RDW 96.2 95.2 - 84.1 %   Platelets 217 150 - 400 K/uL   Neutrophils Relative % 65 %   Neutro Abs 4.2 1.7 - 7.7 K/uL   Lymphocytes Relative 29 %   Lymphs Abs 1.9 0.7 - 4.0 K/uL   Monocytes Relative 3 %   Monocytes Absolute 0.2 0.1 - 1.0 K/uL   Eosinophils Relative 3 %   Eosinophils Absolute 0.2 0.0 - 0.7 K/uL   Basophils Relative 0 %   Basophils Absolute 0.0 0.0 - 0.1 K/uL  Wet prep, genital     Status: Abnormal   Collection Time: 09/02/17  8:47 PM  Result Value Ref Range   Yeast Wet Prep HPF POC NONE SEEN NONE SEEN   Trich, Wet Prep NONE SEEN NONE SEEN   Clue Cells Wet Prep HPF POC NONE SEEN NONE SEEN   WBC, Wet Prep HPF POC MANY (A) NONE SEEN   Sperm NONE SEEN    US Ob Comp Less 14 Wks  Result Date: 09/02/2017 CLINICAL DATA:  Vaginal spotting.  Abdominal pain for 7 days. EXAM: OBSTETRIC <14 WK ULTRASOUND TECHNIQUE: Transabdominal ultrasound was performed for evaluation of the gestation as well as the maternal uterus and adnexal regions. COMPARISON:  None. FINDINGS: Intrauterine gestational sac: Single Yolk sac:  Visible Embryo:  Visible Cardiac Activity: Visible Heart Rate: 146 bpm MSD:   mm    w     d CRL:   9.5  mm   6 w 6 d                  [place] EDC: 04/22/2018 Subchorionic hemorrhage:  None visualized. Maternal uterus/adnexae: Normal ovaries. No abnormal pelvic fluid collections.  IMPRESSION: Single living intrauterine gestation measuring 6 weeks 6 days by crown-rump length. Electronically Signed   By: Ellery Plunk M.D.   On: 09/02/2017 21:12    MAU Course  Procedures None  MDM +UPT - in Epic from 3 days ago UA, wet prep, GC/chlamydia, CBC, quant hCG, HIV,  RPR and [place] today to rule out ectopic pregnancy  Assessment and Plan  A: SIUP at [redacted]w[redacted]d Vaginal discharge in pregnancy, first trimester Abdominal pain in pregnancy, first trimester   P: Discharge home Tylenol PRN for pain  First trimester precautions discussed Patient advised to follow-up with [facility] to start prenatal care as planned Pregnancy confirmation letter given  Patient may return to MAU as needed or if her condition were to change or worsen  Vonzella Nipple, PA-C 09/02/2017, 9:21 PM

## 2017-09-03 LAB — RPR: RPR: NONREACTIVE

## 2017-09-03 LAB — HIV ANTIBODY (ROUTINE TESTING W REFLEX): HIV Screen 4th Generation wRfx: NONREACTIVE

## 2017-09-05 LAB — GC/CHLAMYDIA PROBE AMP (~~LOC~~) NOT AT ARMC
Chlamydia: NEGATIVE
Neisseria Gonorrhea: NEGATIVE

## 2017-09-06 ENCOUNTER — Inpatient Hospital Stay (HOSPITAL_COMMUNITY): Payer: Self-pay

## 2017-09-06 ENCOUNTER — Encounter (HOSPITAL_COMMUNITY): Payer: Self-pay | Admitting: *Deleted

## 2017-09-06 ENCOUNTER — Inpatient Hospital Stay (HOSPITAL_COMMUNITY)
Admission: AD | Admit: 2017-09-06 | Discharge: 2017-09-06 | Disposition: A | Discharge status: Home / Self Care | Payer: Self-pay | Source: Ambulatory Visit | Attending: Family Medicine | Admitting: Family Medicine

## 2017-09-06 DIAGNOSIS — Z3A01 Less than 8 weeks gestation of pregnancy: Secondary | ICD-10-CM | POA: Insufficient documentation

## 2017-09-06 DIAGNOSIS — O209 Hemorrhage in early pregnancy, unspecified: Secondary | ICD-10-CM | POA: Diagnosis present

## 2017-09-06 DIAGNOSIS — Z789 Other specified health status: Secondary | ICD-10-CM | POA: Diagnosis present

## 2017-09-06 DIAGNOSIS — Z603 Acculturation difficulty: Secondary | ICD-10-CM | POA: Diagnosis present

## 2017-09-06 LAB — URINALYSIS, ROUTINE W REFLEX MICROSCOPIC
Bilirubin Urine: NEGATIVE
GLUCOSE, UA: NEGATIVE mg/dL
Ketones, ur: NEGATIVE mg/dL
Nitrite: NEGATIVE
PH: 5 (ref 5.0–8.0)
Protein, ur: NEGATIVE mg/dL
Specific Gravity, Urine: 1.026 (ref 1.005–1.030)

## 2017-09-06 NOTE — MAU Note (Signed)
+  vaginal bleeding Red in color with small clots Having to wear a panty liner Was seen Friday with brown spotting; states the bleeding stopped over the weekend but started back this morning.   +lower abdominal pain Cramping in nature Intermittent pain Rating pain 5/10

## 2017-09-06 NOTE — MAU Provider Note (Signed)
History     CSN: [id number]  Arrival date and time: 09/06/17 4098   First Provider Initiated Contact with Patient 09/06/17 0919 - initial assessment and exam done using [employer] video Interpreter Leretha Pol [phone]    Chief Complaint  Patient presents with  . Abdominal Pain  . Vaginal Bleeding   HPI  [name] is 26 y.o. G2P1001 at [redacted]w[redacted]d gestation by U/S presenting to MAU with complaints of abdominal cramping, BRB and one blood clot this morning. She reports she saw BRB while using the BR.  Once arriving here, she saw the 1 blood clot and none since then.  She continues to have abdominal cramping rating it a 4/10. She was seen here on 10/12 with a complaint of brown d/c and cramping. She had a single living IUP at that time. She denies any recent SI; last was >1 month ago.  Past Medical History:  Diagnosis Date  . Gestational diabetes   . History of HPV infection     Past Surgical History:  Procedure Laterality Date  . NO PAST SURGERIES      Family History  Problem Relation Age of Onset  . Diabetes Father     Social History  Substance Use Topics  . Smoking status: Never Smoker  . Smokeless tobacco: Never Used  . Alcohol use No    Allergies: No Known Allergies  Prescriptions Prior to Admission  Medication Sig Dispense Refill Last Dose  . LORazepam (ATIVAN) 1 MG tablet Take 1 tablet (1 mg total) by mouth at bedtime. 6 tablet 0 Taking  . Prenatal Vit-Fe Fumarate-FA (PRENATAL VITAMIN PO) Take 1 tablet by mouth daily.    Taking  . sertraline (ZOLOFT) 50 MG tablet Take 1 tablet (50 mg total) by mouth daily. 30 tablet 2 Taking  . vitamin B-6 (PYRIDOXINE) 25 MG tablet Take 1 tablet (25 mg total) by mouth daily. 90 tablet 1     Review of Systems  Constitutional: Negative.   HENT: Negative.   Eyes: Negative.   Respiratory: Negative.   Cardiovascular: Negative.   Gastrointestinal: Positive for abdominal pain (lower cramping).  Endocrine: Negative.    Genitourinary: Positive for pelvic pain and vaginal bleeding.  Musculoskeletal: Negative.   Skin: Negative.   Allergic/Immunologic: Negative.   Neurological: Negative.   Hematological: Negative.   Psychiatric/Behavioral: Negative.    Physical Exam   Blood pressure 110/66, pulse 86, temperature 97.7 F (36.5 C), temperature source Oral, resp. rate 18, last menstrual period 06/27/2017, SpO2 99 %.  Physical Exam  Nursing note and vitals reviewed. Constitutional: She is oriented to person, place, and time. She appears well-developed and well-nourished.  HENT:  Head: Normocephalic.  Eyes: Pupils are equal, round, and reactive to light.  Neck: Normal range of motion.  Cardiovascular: Normal rate, regular rhythm and normal heart sounds.   Respiratory: Effort normal and breath sounds normal.  GI: Soft. Bowel sounds are normal.  Genitourinary:  Genitourinary Comments: Uterus: enlarged, non-tender, S=D, cx: smooth, pink, no lesions, small amt of dark, mucous blood in vaginal vault, closed/long/firm, no CMT or friability, no adnexal tenderness   Musculoskeletal: Normal range of motion.  Neurological: She is alert and oriented to person, place, and time.  Skin: Skin is warm and dry.  Psychiatric: She has a normal mood and affect. Her behavior is normal. Judgment and thought content normal.    MAU Course  Procedures  MDM [facility] OB Limited Transvaginal U/S  Results for orders placed or performed during the hospital encounter  of 09/06/17 (from the past 24 hour(s))  Urinalysis, Routine w reflex microscopic     Status: Abnormal   Collection Time: 09/06/17  9:05 AM  Result Value Ref Range   Color, Urine YELLOW YELLOW   APPearance HAZY (A) CLEAR   Specific Gravity, Urine 1.026 1.005 - 1.030   pH 5.0 5.0 - 8.0   Glucose, UA NEGATIVE NEGATIVE mg/dL   Hgb urine dipstick LARGE (A) NEGATIVE   Bilirubin Urine NEGATIVE NEGATIVE   Ketones, ur NEGATIVE NEGATIVE mg/dL   Protein, ur NEGATIVE  NEGATIVE mg/dL   Nitrite NEGATIVE NEGATIVE   Leukocytes, UA TRACE (A) NEGATIVE   RBC / HPF 6-30 0 - 5 RBC/hpf   WBC, UA 6-30 0 - 5 WBC/hpf   Bacteria, UA RARE (A) NONE SEEN   Squamous Epithelial / LPF 0-5 (A) NONE SEEN   Mucus PRESENT    US Ob Transvaginal  Result Date: 09/06/2017 CLINICAL DATA:  Cramping, bleeding EXAM: TRANSVAGINAL OB ULTRASOUND TECHNIQUE: Transvaginal ultrasound was performed for complete evaluation of the gestation as well as the maternal uterus, adnexal regions, and pelvic cul-de-sac. COMPARISON:  09/02/2017 FINDINGS: Intrauterine gestational sac: Single Yolk sac:  Visualized Embryo:  Visualized Cardiac Activity: Visualized Heart Rate: 162 bpm MSD:   mm    w     d CRL:   14.7  mm   7 w 5 d                  [place] EDC: 04/20/2018 Subchorionic hemorrhage:  None visualized. Maternal uterus/adnexae: 2 fibroids noted in the uterus, the largest posterior right uterus measuring 2.2 cm. No adnexal masses or free fluid. IMPRESSION: Seven week 5 day intrauterine pregnancy. Fetal heart rate 162 beats per minute. No acute maternal findings. Electronically Signed   By: Charlett Nose M.D.   On: 09/06/2017 10:34    Assessment and Plan  Vaginal bleeding in pregnancy, first trimester  - Bleeding precautions reviewed - Information provided on bleeding in early pregnancy  - Advised to return to MAU for heavy bleeding and/or clots  Discharge home Patient verbalized an understanding of the plan of care and agrees.   Raelyn Mora, MSN, CNM 09/06/2017, 9:19 AM

## 2017-10-05 ENCOUNTER — Encounter: Payer: Self-pay | Admitting: Medical

## 2017-10-05 ENCOUNTER — Ambulatory Visit (INDEPENDENT_AMBULATORY_CARE_PROVIDER_SITE_OTHER): Payer: Self-pay | Admitting: Medical

## 2017-10-05 VITALS — BP 104/69 | HR 78 | Wt 125.6 lb

## 2017-10-05 DIAGNOSIS — O0991 Supervision of high risk pregnancy, unspecified, first trimester: Secondary | ICD-10-CM

## 2017-10-05 DIAGNOSIS — Z23 Encounter for immunization: Secondary | ICD-10-CM

## 2017-10-05 DIAGNOSIS — Z3401 Encounter for supervision of normal first pregnancy, first trimester: Secondary | ICD-10-CM

## 2017-10-05 DIAGNOSIS — Z3400 Encounter for supervision of normal first pregnancy, unspecified trimester: Secondary | ICD-10-CM

## 2017-10-05 DIAGNOSIS — Z113 Encounter for screening for infections with a predominantly sexual mode of transmission: Secondary | ICD-10-CM

## 2017-10-05 LAB — POCT URINALYSIS DIP (DEVICE)
BILIRUBIN URINE: NEGATIVE
Glucose, UA: NEGATIVE mg/dL
HGB URINE DIPSTICK: NEGATIVE
Ketones, ur: NEGATIVE mg/dL
Nitrite: NEGATIVE
Protein, ur: NEGATIVE mg/dL
SPECIFIC GRAVITY, URINE: 1.025 (ref 1.005–1.030)
Urobilinogen, UA: 0.2 mg/dL (ref 0.0–1.0)
pH: 6.5 (ref 5.0–8.0)

## 2017-10-05 LAB — OB RESULTS CONSOLE GC/CHLAMYDIA: Gonorrhea: NEGATIVE

## 2017-10-05 NOTE — Progress Notes (Signed)
Flu vaccine today. Would like to think about Babyscripts Declines 1st trimester screen Alexandra Hardy, Spanish interpreter used for visit

## 2017-10-05 NOTE — Progress Notes (Signed)
   PRENATAL VISIT NOTE  Subjective:  Alexandra Hardy is a 26 y.o. G2P1001 at 374w4d being seen today for her first prenatal visit.  She is currently monitored for the following issues for this low-risk pregnancy and has Language barrier affecting health care; Pap smear of cervix shows high risk HPV present; Atypical chest pain; Upper back pain on left side; Musculoskeletal pain; Vaginal bleeding in pregnancy, first trimester; and Supervision of high risk pregnancy, antepartum, first trimester on their problem list.  Patient reports nausea.  Contractions: Not present. Vag. Bleeding: None.  Movement: Absent. Denies leaking of fluid.   The following portions of the patient's history were reviewed and updated as appropriate: allergies, current medications, past family history, past medical history, past social history, past surgical history and problem list. Problem list updated.  Objective:   Vitals:   10/05/17 1053  BP: 104/69  Pulse: 78  Weight: 125 lb 9.6 oz (57 kg)    Fetal Status: Fetal Heart Rate (bpm): 160   Movement: Absent     General:  Alert, oriented and cooperative. Patient is in no acute distress.  Skin: Skin is warm and dry. No rash noted.   Cardiovascular: Normal heart rate noted  Respiratory: Normal respiratory effort, no problems with respiration noted  Abdomen: Soft, gravid, appropriate for gestational age.  Pain/Pressure: Present     Pelvic: Cervical exam deferred        Extremities: Normal range of motion.  Edema: None  Mental Status:  Normal mood and affect. Normal behavior. Normal judgment and thought content.   Assessment and Plan:  Pregnancy: G2P1001 at 3374w4d  1. Supervision of normal first pregnancy, antepartum - Flu Vaccine QUAD 36+ mos IM (Fluarix, Quad PF) - HgB A1c - Obstetric Panel, Including HIV - Cervicovaginal ancillary only  First trimester warning symptoms and general obstetric precautions including but not limited to vaginal bleeding,  contractions, leaking of fluid and fetal movement were reviewed in detail with the patient. Please refer to After Visit Summary for other counseling recommendations.  Return in about 4 weeks (around 11/02/2017) for LOB.   Vonzella NippleJulie Wenzel, PA-C

## 2017-10-05 NOTE — Patient Instructions (Signed)
Primer trimestre de embarazo °(First Trimester of Pregnancy) °El primer trimestre de embarazo se extiende desde la semana 1 hasta el final de la semana 12 (mes 1 al mes 3). Durante este tiempo, el bebé comenzará a desarrollarse dentro suyo. Entre la semana 6 y la 8, se forman los ojos y el rostro, y los latidos del corazón pueden verse en la ecografía. Al final de las 12 semanas, todos los órganos del bebé están formados. La atención prenatal es toda la asistencia médica que usted recibe antes del nacimiento del bebé. Asegúrese de recibir una buena atención prenatal y de seguir todas las indicaciones del médico. °CUIDADOS EN EL HOGAR °Medicamentos: °· Tome los medicamentos solamente como se lo haya indicado el médico. Algunos medicamentos se pueden tomar durante el embarazo y otros no. °· Tome las vitaminas prenatales como se lo haya indicado el médico. °· Tome el medicamento que la ayuda a defecar (laxante suave) según sea necesario, si el médico lo autoriza. °Dieta °· Ingiera alimentos saludables de manera regular. °· El médico le indicará la cantidad de peso que puede aumentar. °· No coma carne cruda ni quesos sin cocinar. °· Si tiene malestar estomacal (náuseas) o vomita: °? Ingiera 4 o 5 comidas pequeñas por día en lugar de 3 abundantes. °? Intente comer algunas galletitas saladas. °? Beba líquidos entre las comidas, en lugar de hacerlo durante estas. °· Si tiene dificultad para defecar (estreñimiento): °? Consuma alimentos con alto contenido de fibra, como verduras y frutas frescos, y cereales integrales. °? Beba suficiente líquido para mantener el pis (orina) claro o de color amarillo pálido. °Actividad y ejercicios °· Haga ejercicios solamente como se lo haya indicado el médico. Deje de hacer ejercicios si tiene cólicos o dolor en la parte baja del vientre (abdomen) o en la cintura. °· Intente no estar de pie durante mucho tiempo. Mueva las piernas con frecuencia si debe estar de pie en un lugar durante  mucho tiempo. °· Evite levantar pesos excesivos. °· Use zapatos con tacones bajos. Mantenga una buena postura al sentarse y pararse. °· Puede tener relaciones sexuales, a menos que el médico le indique lo contrario. °Alivio del dolor o las molestias °· Use un sostén que le brinde buen soporte si le duelen las mamas. °· Dese baños con agua tibia (baños de asiento) para aliviar el dolor o las molestias a causa de las hemorroides. Use crema antihemorroidal si el médico se lo permite. °· Descanse con las piernas elevadas si tiene calambres o dolor de cintura. °· Use medias de descanso si tiene las venas de las piernas hinchadas y abultadas (venas varicosas). Eleve los pies durante 15 minutos, 3 o 4 veces por día. Limite la cantidad de sal en su dieta. °Cuidados prenatales °· Programe las visitas prenatales para la semana 12 de embarazo. °· Escriba sus preguntas. Llévelas cuando concurra a las visitas prenatales. °· Concurra a todas las visitas prenatales como se lo haya indicado el médico. °Seguridad °· Colóquese el cinturón de seguridad cuando conduzca. °· Haga una lista con los números de teléfono en caso de emergencia, en la cual deben incluirse los números de los familiares, los amigos, el hospital y los departamentos de policía y de bomberos. °Consejos generales °· Pídale al médico que la derive a clases prenatales en su localidad. Debe comenzar a tomar las clases antes de entrar en el mes 6 de embarazo. °· Pida ayuda si necesita asesoramiento o asistencia con la alimentación. El médico puede aconsejarla o indicarle dónde recurrir para recibir ayuda. °· No se dé baños de inmersión en agua caliente, baños   turcos ni saunas. °· No se haga duchas vaginales ni use tampones o toallas higiénicas perfumadas. °· No mantenga las piernas cruzadas durante mucho tiempo. °· Evite el contacto con las bandejas sanitarias de los gatos y la tierra que estos animales usan. °· No fume, no consuma hierbas ni beba alcohol. No tome  fármacos que el médico no haya autorizado. °· No consuma ningún producto que contenga tabaco, lo que incluye cigarrillos, tabaco de mascar o cigarrillos electrónicos. Si necesita ayuda para dejar de fumar, consulte al médico. Puede recibir asesoramiento u otro tipo de apoyo para dejar de fumar. °· Visite al dentista. En su casa, lávese los dientes con un cepillo dental suave. Pásese el hilo dental con suavidad. °SOLICITE AYUDA SI: °· Tiene mareos. °· Tiene cólicos leves o siente presión en la parte baja del vientre. °· Siente un dolor persistente en la zona del vientre. °· Sigue teniendo malestar estomacal, vomita o las heces son líquidas (diarrea). °· Observa una secreción, con mal olor que proviene de la vagina. °· Siente dolor al orinar. °· Tiene el rostro, las manos, las piernas o los tobillos más hinchados (inflamados). ° °SOLICITE AYUDA DE INMEDIATO SI: °· Tiene fiebre. °· Tiene una pérdida de líquido por la vagina. °· Tiene sangrado o pequeñas pérdidas vaginales. °· Tiene cólicos o dolor muy intensos en el vientre. °· Sube o baja de peso rápidamente. °· Vomita sangre. Puede ser similar a la borra del café °· Está en contacto con personas que tienen rubéola, la quinta enfermedad o varicela. °· Siente un dolor de cabeza muy intenso. °· Le falta el aire. °· Sufre cualquier tipo de traumatismo, por ejemplo, debido a una caída o un accidente automovilístico. ° °Esta información no tiene como fin reemplazar el consejo del médico. Asegúrese de hacerle al médico cualquier pregunta que tenga. °Document Released: 02/04/2009 Document Revised: 11/29/2014 Document Reviewed: 09/18/2013 °Elsevier Interactive Patient Education © 2017 [employer] ° °

## 2017-10-06 LAB — CERVICOVAGINAL ANCILLARY ONLY
Chlamydia: NEGATIVE
NEISSERIA GONORRHEA: NEGATIVE

## 2017-10-06 LAB — OBSTETRIC PANEL, INCLUDING HIV
Antibody Screen: NEGATIVE
BASOS ABS: 0 10*3/uL (ref 0.0–0.2)
BASOS: 0 %
EOS (ABSOLUTE): 0.2 10*3/uL (ref 0.0–0.4)
Eos: 4 %
HEMATOCRIT: 37.9 % (ref 34.0–46.6)
HIV Screen 4th Generation wRfx: NONREACTIVE
Hemoglobin: 12.6 g/dL (ref 11.1–15.9)
Hepatitis B Surface Ag: NEGATIVE
Immature Grans (Abs): 0 10*3/uL (ref 0.0–0.1)
Immature Granulocytes: 0 %
Lymphocytes Absolute: 1.5 10*3/uL (ref 0.7–3.1)
Lymphs: 31 %
MCH: 31.7 pg (ref 26.6–33.0)
MCHC: 33.2 g/dL (ref 31.5–35.7)
MCV: 95 fL (ref 79–97)
MONOS ABS: 0.4 10*3/uL (ref 0.1–0.9)
Monocytes: 8 %
NEUTROS ABS: 2.8 10*3/uL (ref 1.4–7.0)
Neutrophils: 57 %
PLATELETS: 222 10*3/uL (ref 150–379)
RBC: 3.98 x10E6/uL (ref 3.77–5.28)
RDW: 13.5 % (ref 12.3–15.4)
RPR Ser Ql: NONREACTIVE
Rh Factor: POSITIVE
Rubella Antibodies, IGG: 4.31 index (ref >0.99)
WBC: 4.9 10*3/uL (ref 3.4–10.8)

## 2017-10-06 LAB — HEMOGLOBIN A1C
Est. average glucose Bld gHb Est-mCnc: 105 mg/dL
HEMOGLOBIN A1C: 5.3 % (ref 4.8–5.6)

## 2017-10-07 LAB — CULTURE, OB URINE

## 2017-10-07 LAB — URINE CULTURE, OB REFLEX

## 2017-10-17 ENCOUNTER — Encounter (HOSPITAL_COMMUNITY): Payer: Self-pay

## 2017-10-17 ENCOUNTER — Inpatient Hospital Stay (HOSPITAL_COMMUNITY)
Admission: AD | Admit: 2017-10-17 | Discharge: 2017-10-17 | Disposition: A | Discharge status: Home / Self Care | Payer: Self-pay | Source: Ambulatory Visit | Attending: Obstetrics & Gynecology | Admitting: Obstetrics & Gynecology

## 2017-10-17 DIAGNOSIS — J029 Acute pharyngitis, unspecified: Secondary | ICD-10-CM | POA: Insufficient documentation

## 2017-10-17 DIAGNOSIS — Z3A13 13 weeks gestation of pregnancy: Secondary | ICD-10-CM | POA: Insufficient documentation

## 2017-10-17 DIAGNOSIS — O99511 Diseases of the respiratory system complicating pregnancy, first trimester: Secondary | ICD-10-CM | POA: Insufficient documentation

## 2017-10-17 DIAGNOSIS — Z79899 Other long term (current) drug therapy: Secondary | ICD-10-CM | POA: Insufficient documentation

## 2017-10-17 DIAGNOSIS — J020 Streptococcal pharyngitis: Secondary | ICD-10-CM

## 2017-10-17 LAB — URINALYSIS, ROUTINE W REFLEX MICROSCOPIC
BILIRUBIN URINE: NEGATIVE
Glucose, UA: NEGATIVE mg/dL
KETONES UR: NEGATIVE mg/dL
LEUKOCYTES UA: NEGATIVE
NITRITE: NEGATIVE
PH: 5 (ref 5.0–8.0)
Protein, ur: NEGATIVE mg/dL
SPECIFIC GRAVITY, URINE: 1.012 (ref 1.005–1.030)

## 2017-10-17 LAB — INFLUENZA PANEL BY PCR (TYPE A & B)
INFLAPCR: NEGATIVE
Influenza B By PCR: NEGATIVE

## 2017-10-17 LAB — RAPID STREP SCREEN (MED CTR MEBANE ONLY): STREPTOCOCCUS, GROUP A SCREEN (DIRECT): POSITIVE — AB

## 2017-10-17 MED ORDER — PENICILLIN V POTASSIUM 500 MG PO TABS: 500.0000 mg | ORAL_TABLET | Freq: Three times a day (TID) | ORAL | 0 refills | Status: DC

## 2017-10-17 MED ORDER — PSEUDOEPHEDRINE HCL 30 MG PO TABS
60.0000 mg | ORAL_TABLET | Freq: Once | ORAL | Status: AC
  Administered 2017-10-17: 60 mg via ORAL
  Filled 2017-10-17: qty 2

## 2017-10-17 MED ORDER — ACETAMINOPHEN 325 MG PO TABS
650.0000 mg | ORAL_TABLET | Freq: Once | ORAL | Status: AC
  Administered 2017-10-17: 650 mg via ORAL
  Filled 2017-10-17: qty 2

## 2017-10-17 NOTE — MAU Note (Signed)
Pt c/o of difficulty swallowing, ear pain, body aches. Symptoms started yesterday morning and has been taking Tylenol, has not been taking away the pain.

## 2017-10-17 NOTE — MAU Provider Note (Signed)
Chief Complaint:  Sore Throat; Generalized Body Aches; and Chills   First Provider Initiated Contact with Patient 10/17/17 [id number]     HPI: Alexandra Hardy is a 26 y.o. G2P1001 at 5313w2dwho presents to maternity admissions reporting sore throat since yesterday.  Some coughing also. Some chills but did not take temperature.  No sick contacts. She denies LOF, vaginal bleeding, vaginal itching/burning, urinary symptoms, h/a, dizziness, n/v, diarrhea, constipation or fever/chills.   She denies LOF, vaginal bleeding, vaginal itching/burning, urinary symptoms, h/a, dizziness, n/v, diarrhea, constipation.    Sore Throat   This is a new problem. The current episode started yesterday. The problem has been unchanged. Neither side of throat is experiencing more pain than the other. There has been no fever. The pain is moderate. Associated symptoms include congestion, coughing, ear pain and trouble swallowing. Pertinent negatives include no abdominal pain, diarrhea, shortness of breath or vomiting. She has had no exposure to strep. She has tried nothing for the symptoms.    RN note: Pt c/o of difficulty swallowing, ear pain, body aches. Symptoms started yesterday morning and has been taking Tylenol, has not been taking away the pain.     Past Medical History: Past Medical History:  Diagnosis Date  . Gestational diabetes   . History of HPV infection     Past obstetric history: OB History  Gravida Para Term Preterm AB Living  2 1 1     1   SAB TAB Ectopic Multiple Live Births        0 1    # Outcome Date GA Lbr Len/2nd Weight Sex Delivery Anes PTL Lv  2 Current           1 Term 09/29/15 5583w6d 06:24 / 03:42 7 lb 10.8 oz (3.481 kg) F Vag-Spont EPI  LIV      Past Surgical History: Past Surgical History:  Procedure Laterality Date  . NO PAST SURGERIES      Family History: Family History  Problem Relation Age of Onset  . Diabetes Father     Social History: Social History   Tobacco  Use  . Smoking status: Never Smoker  . Smokeless tobacco: Never Used  Substance Use Topics  . Alcohol use: No  . Drug use: No    Allergies: No Known Allergies  Meds:  Medications Prior to Admission  Medication Sig Dispense Refill Last Dose  . acetaminophen (TYLENOL) 325 MG tablet Take 650 mg by mouth every 6 (six) hours as needed for moderate pain or headache.   10/17/2017 at 0000  . LORazepam (ATIVAN) 1 MG tablet Take 1 tablet (1 mg total) by mouth at bedtime. (Patient not taking: Reported on 09/06/2017) 6 tablet 0 Not Taking  . Prenatal Vit-Fe Fumarate-FA (PRENATAL VITAMIN PO) Take 1 tablet by mouth daily.    Taking  . sertraline (ZOLOFT) 50 MG tablet Take 1 tablet (50 mg total) by mouth daily. (Patient not taking: Reported on 09/06/2017) 30 tablet 2 Not Taking  . vitamin B-6 (PYRIDOXINE) 25 MG tablet Take 1 tablet (25 mg total) by mouth daily. (Patient not taking: Reported on 09/06/2017) 90 tablet 1 Not Taking    I have reviewed patient's Past Medical Hx, Surgical Hx, Family Hx, Social Hx, medications and allergies.   ROS:  Review of Systems  Constitutional: Positive for chills. Negative for fever.  HENT: Positive for congestion, ear pain and trouble swallowing. Negative for hearing loss and sinus pressure.   Respiratory: Positive for cough. Negative for chest tightness and  shortness of breath.   Gastrointestinal: Negative for abdominal pain, diarrhea and vomiting.   Other systems negative  Physical Exam   Patient Vitals for the past 24 hrs:  BP Temp Temp src  10/17/17 0804 110/70 99.8 F (37.7 C) Oral   Constitutional: Well-developed, well-nourished female in no acute distress.  EENT:  Throat injected.  Left ear with some erethema, right ear clear. Cardiovascular: normal rate and rhythm Respiratory: normal effort, clear to auscultation bilaterally GI: Abd soft, non-tender, gravid appropriate for gestational age.   No rebound or guarding. MS: Extremities nontender, no  edema, normal ROM Neurologic: Alert and oriented x 4.  GU: Neg CVAT.  PELVIC EXAM: deferred FHT:  172   Labs: O/Positive/-- (11/14 1128) Results for orders placed or performed during the hospital encounter of 10/17/17 (from the past 72 hour(s))  Urinalysis, Routine w reflex microscopic     Status: Abnormal   Collection Time: 10/17/17  7:53 AM  Result Value Ref Range   Color, Urine YELLOW YELLOW   APPearance HAZY (A) CLEAR   Specific Gravity, Urine 1.012 1.005 - 1.030   pH 5.0 5.0 - 8.0   Glucose, UA NEGATIVE NEGATIVE mg/dL   Hgb urine dipstick SMALL (A) NEGATIVE   Bilirubin Urine NEGATIVE NEGATIVE   Ketones, ur NEGATIVE NEGATIVE mg/dL   Protein, ur NEGATIVE NEGATIVE mg/dL   Nitrite NEGATIVE NEGATIVE   Leukocytes, UA NEGATIVE NEGATIVE   RBC / HPF 0-5 0 - 5 RBC/hpf   WBC, UA 0-5 0 - 5 WBC/hpf   Bacteria, UA RARE (A) NONE SEEN   Squamous Epithelial / LPF 6-30 (A) NONE SEEN   Mucus PRESENT   Rapid strep screen     Status: Abnormal   Collection Time: 10/17/17  8:05 AM  Result Value Ref Range   Streptococcus, Group A Screen (Direct) POSITIVE (A) NEGATIVE    Comment: Performed at [facility], [address] [place], [place] [address]  Influenza panel by PCR (type A & B)     Status: None   Collection Time: 10/17/17  8:06 AM  Result Value Ref Range   Influenza A By PCR NEGATIVE NEGATIVE   Influenza B By PCR NEGATIVE NEGATIVE    Comment: (NOTE) The Xpert Xpress Flu assay is intended as an aid in the diagnosis of  influenza and should not be used as a sole basis for treatment.  This  assay is FDA approved for nasopharyngeal swab specimens only. Nasal  washings and aspirates are unacceptable for Xpert Xpress Flu testing.     Imaging:  No results found.  MAU Course/MDM: I have ordered labs and reviewed results.   Strep POSITIVE.   Flu NEGATIVE Interpretor present  Treatments in MAU included none Discussed antibiotic treatment for strep. Discussed precautions against  transmission..    Assessment: Single IUP at 9363w3d Strep sore throat - Plan: Discharge patient   Plan: Discharge home Rx Pen VK 500mg  TID x 10 days Follow up in Office for prenatal visits and recheck of status Transmission precautions PO fluids Tylenol for pain or fever Encouraged to return here or to other Urgent Care/ED if she develops worsening of symptoms, increase in pain, fever, or other concerning symptoms.   Pt stable at time of discharge.  Wynelle BourgeoisMarie Rachana Malesky CNM, MSN Certified Nurse-Midwife 10/17/2017 8:23 AM

## 2017-10-17 NOTE — Discharge Instructions (Signed)
Prueba rpida para estreptococos (Rapid Strep Test) La faringitis estreptoccica es una infeccin bacteriana causada por la especie de bacterias Streptococcus pyogenes. La prueba rpida para estreptococos es la forma ms veloz de comprobar si estas bacterias son las causantes del dolor de Advertising copywritergarganta. La prueba puede realizarse en el consultorio del mdico. Radium SpringsGeneralmente, los resultados estn listos en el trmino de 10 a 20minutos. Pueden hacerle este estudio si tiene sntomas de Paramedicfaringitis estreptoccica. Estos incluyen los siguientes:  Garganta roja con manchas amarillas o blancas.  Hinchazn y dolor de cuello.  Grant RutsFiebre.  Prdida del apetito.  Problemas para respirar o tragar.  Erupcin.  Deshidratacin. Esta prueba requiere que se tome una muestra de las secreciones de la parte posterior de la garganta y las Dumfriesamgdalas. El mdico puede bajarle la lengua con un bajalenguas y usar un hisopo para tomar la Lake Panasoffkeemuestra, y, al Arrow Electronicsmismo tiempo, tomar una segunda muestra que puede utilizarse para un cultivo de las secreciones de la garganta. En un cultivo, la Luxembourgmuestra se Lao People's Democratic Republiccombina con una sustancia que promueve el crecimiento de las bacterias. Toma ms tiempo Starbucks Corporationobtener los resultados del cultivo de las secreciones de la garganta, [place]pero son ms exactos y American Electric Powerpueden confirmar los de la prueba rpida para estreptococos o Estate agentdemostrar que esos resultados estaban equivocados. RESULTADOS Es su responsabilidad retirar el resultado del Ma[place] Cityestudio. Consulte en el laboratorio o en el departamento en el que fue realizado el estudio cundo y cmo podr Starbucks Corporationobtener los resultados. Comunquese con el mdico si tiene Smith Internationalpreguntas sobre los resultados. Los resultados de la prueba rpida para estreptococos sern negativos o positivos. Significado de los Lear Corporationresultados negativos del anlisis Si el resultado de la prueba rpida para estreptococos es negativo, significa que:  Es probable que no Publishing copytenga faringitis estreptoccica.  Es posible que un virus  est causando el dolor de Advertising copywritergarganta. El mdico puede hacer un cultivo de las secreciones de la garganta para confirmar los Watsekaresultados de la prueba rpida para estreptococos. Este cultivo tambin puede identificar las diferentes cepas de las bacterias estreptoccicas. Significado de Starbucks Corporationlos resultados positivos del anlisis Si el resultado de la prueba rpida para estreptococos es positivo, significa que:  Es probable que Publishing copytenga faringitis estreptoccica.  Tal vez tenga que tomar antibiticos. El mdico puede hacer un cultivo de las secreciones de la garganta para confirmar los [place] Cityresultados de la prueba rpida para estreptococos. Generalmente, la faringitis estreptoccica requiere un tratamiento con antibiticos. Esta informacin no tiene Theme park managercomo fin reemplazar el consejo del mdico. Asegrese de hacerle al mdico cualquier pregunta que tenga. Document Released: 11/08/2005 Document Revised: 11/29/2014 Document Reviewed: 02/14/2014 Elsevier Interactive Patient Education  2017 ArvinMeritorElsevier Inc.   Faringitis (Pharyngitis) La faringitis es el dolor de garganta (faringe). La garganta presenta enrojecimiento, hinchazn y dolor. CUIDADOS EN EL HOGAR  Beba suficiente lquido para mantener la orina clara o de color amarillo plido.  Solo tome los medicamentos que le haya indicado su mdico. ? Si no toma los medicamentos segn las indicaciones podra volver a enfermarse. Finalice la prescripcin completa, aunque comience a sentirse mejor. ? No tome aspirina.  Reposo.  Enjuguese la boca Arts administrator(hacer grgaras) con agua y sal (cucharadita de sal por litro de agua) cada 1 o 2horas. Esto ayudar a Engineer, materialsaliviar el dolor.  Si no corre riesgo de ahogarse, puede chupar un caramelo duro o pastillas para la garganta.  SOLICITE AYUDA SI:  Tiene bultos grandes y dolorosos al tacto en el cuello.  Tiene una erupcin cutnea.  Cuando tose elimina una expectoracin verde, amarillo amarronado o con  sangre.  SOLICITE AYUDA DE  INMEDIATO SI:  Presenta rigidez en el cuello.  Babea o no puede tragar lquidos.  Vomita o no puede retener los American International Groupmedicamentos ni los lquidos.  Siente un dolor intenso que no se alivia con medicamentos.  Tiene problemas para Industrial/product designerrespirar (y no debido a la nariz tapada).  ASEGRESE DE QUE:  Comprende estas instrucciones.  Controlar su afeccin.  Recibir ayuda de inmediato si no mejora o si empeora.  Esta informacin no tiene Theme park managercomo fin reemplazar el consejo del mdico. Asegrese de hacerle al mdico cualquier pregunta que tenga. Document Released: 02/04/2009 Document Revised: 08/29/2013 Document Reviewed: 07/16/2013 Elsevier Interactive Patient Education  2017 Arvin[employer]Elsevier Inc.

## 2017-10-17 NOTE — MAU Note (Signed)
Sore throat and body aches started yesterday morning.  Had chills, did not check temp. Denies cough.  No abd pain.

## 2017-11-02 ENCOUNTER — Encounter: Payer: Self-pay | Admitting: Medical

## 2017-11-02 ENCOUNTER — Encounter: Payer: Self-pay | Admitting: Family Medicine

## 2017-11-02 DIAGNOSIS — O09299 Supervision of pregnancy with other poor reproductive or obstetric history, unspecified trimester: Secondary | ICD-10-CM | POA: Insufficient documentation

## 2017-11-02 DIAGNOSIS — Z8632 Personal history of gestational diabetes: Secondary | ICD-10-CM

## 2017-11-03 ENCOUNTER — Encounter: Payer: Self-pay | Admitting: General Practice

## 2017-11-22 NOTE — L&D Delivery Note (Signed)
Delivery Note At 7:27 AM a viable female was delivered via Vaginal, Spontaneous (Presentation: vertex ; ROA ).  APGAR: 9, 9; weight 8 lb 7.1 oz (3830 g).   Placenta status: intact , central insertion .  Cord: 3 vessel  with the following complications: none .  Cord pH: n/a  Anesthesia:  Epidural Episiotomy: None Lacerations: 1st degree perineal, no repair needed Suture Repair: n/a Est. Blood Loss (mL): 400  Mom to postpartum.  Baby to Couplet care / Skin to Skin.  Felisa Bonier, MD, PGY-1 Family Medicine - [facility] [place] 04/12/2018, 8:59 AM

## 2017-11-23 ENCOUNTER — Encounter: Payer: Self-pay | Admitting: Student

## 2017-12-09 ENCOUNTER — Ambulatory Visit (INDEPENDENT_AMBULATORY_CARE_PROVIDER_SITE_OTHER): Payer: Self-pay | Admitting: Family Medicine

## 2017-12-09 DIAGNOSIS — O0991 Supervision of high risk pregnancy, unspecified, first trimester: Secondary | ICD-10-CM

## 2017-12-09 NOTE — Progress Notes (Signed)
   PRENATAL VISIT NOTE  Subjective:  Alexandra Hardy is a 27 y.o. G2P1001 at 7561w6d being seen today for ongoing prenatal care.  She is currently monitored for the following issues for this low-risk pregnancy and has Language barrier affecting health care; Pap smear of cervix shows high risk HPV present; Vaginal bleeding in pregnancy, first trimester; Supervision of high risk pregnancy, antepartum, first trimester; and History of gestational diabetes in prior pregnancy, currently pregnant on their problem list.  Patient reports no complaints.  Contractions: Not present. Vag. Bleeding: None.  Movement: Present. Denies leaking of fluid.   The following portions of the patient's history were reviewed and updated as appropriate: allergies, current medications, past family history, past medical history, past social history, past surgical history and problem list. Problem list updated.  Objective:   Vitals:   12/09/17 1454  BP: (!) 94/54  Pulse: 82  Weight: 62.4 kg (137 lb 8 oz)    Fetal Status: Fetal Heart Rate (bpm): 147   Movement: Present     General:  Alert, oriented and cooperative. Patient is in no acute distress.  Skin: Skin is warm and dry. No rash noted.   Cardiovascular: Normal heart rate noted  Respiratory: Normal respiratory effort, no problems with respiration noted  Abdomen: Soft, gravid, appropriate for gestational age.  Pain/Pressure: Present     Pelvic: Cervical exam deferred        Extremities: Normal range of motion.  Edema: None  Mental Status:  Normal mood and affect. Normal behavior. Normal judgment and thought content.   Assessment and Plan:  Pregnancy: G2P1001 at 4161w6d Routine care Schedule anatomy US today   Preterm labor symptoms and general obstetric precautions including but not limited to vaginal bleeding, contractions, leaking of fluid and fetal movement were reviewed in detail with the patient. Please refer to After Visit Summary for other  counseling recommendations.  Return in about 4 weeks (around 01/06/2018).   Rolm BookbinderAmber Sherald Balbuena, DO

## 2017-12-09 NOTE — Progress Notes (Signed)
Pt states discomfort in abdomen it comes and goes

## 2017-12-09 NOTE — Addendum Note (Signed)
Addended by: Shelly BombardPOLK, Regginald Pask M on: 12/09/2017 03:42 PM   Modules accepted: Orders

## 2017-12-09 NOTE — Patient Instructions (Signed)
Actividad fsica Academic librariandurante el embarazo (Exercise During Pregnancy) Para las personas de todas las edades, la actividad fsica es un aspecto importante para mantenerse sanas. El ejercicio mejora la actividad cardaca y la funcin pulmonar, y ayuda a Pharmacologistmantener la fuerza, la flexibilidad y Firefighterel peso corporal adecuado. Adems, aumenta los niveles de energa y Richlandsmejora el estado de nimo. A la mayora de las mujeres se les recomienda seguir una rutina de ejercicios durante el Spring Branchembarazo. Solo en contadas ocasiones y cuando hay determinadas enfermedades o complicaciones del Pennvilleembarazo, es posible que se les recomiende que limiten o eviten la actividad fsica durante el [place] Parkembarazo. QU OTROS BENEFICIOS TIENE LA ACTIVIDAD FSICA DURANTE EL EMBARAZO? Adems de Devon Energymantener la fuerza y la flexibilidad, la actividad fsica durante el embarazo puede contribuir a lo siguiente:  PhotographerMantener la fuerza de los msculos que cumplen un papel muy importante durante el Alexandriatrabajo de parto y Paiael parto.  Aliviar el dolor de la zona lumbar durante el embarazo.  Reducir el riesgo de tener diabetes mellitus gestacional (DMG).  Mejorar el control del nivel de azcar en la sangre (glucosa) en las mujeres que tienen diabetes mellitus gestacional.  Reducir el riesgo de tener preeclampsia, un trastorno grave que causa hipertensin arterial junto con otros sntomas, como hinchazn y dolores de Turkmenistancabeza.  Reducir el riesgo de cesrea.  Acelerar la recuperacin despus del parto. CON QU FRECUENCIA DEBO HACER EJERCICIO? Salvo que el mdico le d otras indicaciones, debe intentar hacer actividad fsica todos los [address]  [address] de la semana o casi todos. En general, intente hacer ejercicio de intensidad moderada durante aproximadamente 150minutos semanales, los cuales pueden distribuirse en varios das, por ejemplo, 30minutos diarios, 5veces por semana. Puede saber que est haciendo ejercicio de intensidad moderada si tiene una frecuencia cardaca ms elevada y  Burkina Fasouna respiracin ms rpida, pero an Therapist, occupationalpuede mantener una conversacin. QU TIPOS DE EJERCICIOS DE INTENSIDAD MODERADA SE RECOMIENDAN DURANTE EL EMBARAZO? Hay muchos tipos de ejercicios que se pueden Economisthacer durante el embarazo. A menos que el mdico le d otras indicaciones, haga diferentes ejercicios que le aumenten la frecuencia cardaca y Air traffic controllerrespiratoria (cardiopulmonar) de forma segura, y la ayuden a Environmental education officerdesarrollar y Pharmacologistmantener la fuerza muscular (entrenamiento de fuerza). Mientras realiza actividad fsica durante el Hatfieldembarazo, siempre debe poder decir frases completas. Algunos ejemplos de actividades fsicas que no suponen un riesgo durante el embarazo incluyen lo siguiente:  Caminatas rpidas o senderismo.  Natacin.  Gimnasia acutica.  Bicicleta fija.  Entrenamiento de fuerza.  Yoga adaptado o Pilates. Infrmele al profesor/a que est embarazada. Evite elongar demasiado y Teacher, musicacostarse boca arriba durante Trafalgarmucho tiempo.  Correr o trotar. Solo elija este tipo de Snoveractividad fsica si: ? Sola correr o trotar habitualmente antes del embarazo. ? Puede correr o trotar y, aun as, decir frases completas. QU TIPOS DE EJERCICIOS NO DEBO REALIZAR DURANTE EL EMBARAZO? En funcin de su estado fsico y de si realiz actividad fsica habitualmente antes del Psychiatristembarazo, tal vez le recomienden Sonic Automotivelimitar los ejercicios de intensidad vigorosa durante la gestacin. Puede saber que est haciendo ejercicio de intensidad vigorosa si respira con mucha ms dificultad y rapidez, y no puede Pharmacologistmantener una conversacin. Algunos ejemplos de actividades fsicas que debe evitar durante el embarazo incluyen lo siguiente:  Deportes de contacto.  Actividades que la ponen en riesgo de sufrir cadas o recibir golpes en el abdomen, por ejemplo, esqu extremo, esqu acutico, surf, escalada en roca, ciclismo, gimnasia y equitacin.  Buceo.  Paracaidismo.  Yoga o Pilates en un recinto donde las temperaturas  son extremadamente  elevadas ("yoga a altas temperaturas" o "Pilates a altas temperaturas").  Trotar o correr, a menos que haya corrido o trotado habitualmente antes del Psychiatristembarazo. Mientras corre o trota, siempre debe poder decir frases completas. No corra ni trote con mucha energa de modo que no pueda mantener una conversacin.  Si no est habituada a hacer actividad fsica a grandes alturas (ms de 6000metros International Business Machinessobre el nivel del mar), no lo haga durante el embarazo. CUNDO DEBO EVITAR LA ACTIVIDAD FSICA DURANTE EL EMBARAZO? Hay algunas afecciones que New York Life Insurancepueden hacer que la actividad fsica durante el Godleyembarazo sea Perryriesgosa, o bien aumentar el riesgo de aborto espontneo o de trabajo de parto y parto prematuros. Algunas de estas afecciones son las siguientes:  Algunos tipos de cardiopatas coronarias.  Algunos tipos de enfermedades pulmonares.  Placenta previa. Esto ocurre cuando la placenta cubre de Moorestown-Lenolamanera parcial o total la abertura del tero (cuello uterino).  Hemorragias vaginales frecuentes durante el embarazo.  Insuficiencia cervicouterina. Esto ocurre cuando el cuello del tero no permanece tan cerrado como debera durante el Nettletonembarazo.  Parto prematuro.  Rotura de Ophiemmembranas. Esto ocurre cuando el saco de proteccin (saco amnitico) se abre y se produce la prdida del lquido amnitico por la vagina.  Recuento sanguneo muy bajo (anemia).  Preeclampsia o hipertensin arterial especfica del embarazo.  Embarazo de ms de un feto (gestacin mltiple) y el riesgo adicional de parto prematuro.  Diabetes mal controlada.  Muy bajo peso o mucho sobrepeso.  Restriccin del crecimiento intrauterino. Esto ocurre cuando el crecimiento y el desarrollo del feto durante el embarazo son ms lentos de lo previsto.  Otras enfermedades. Pregntele al mdico si alguna de estas afecciones corresponden a su caso. QU MS DEBO SABER SOBRE LA ACTIVIDAD FSICA DURANTE EL EMBARAZO? Debe tomar estas precauciones  mientras hace actividad fsica durante el embarazo:  No se abrigue demasiado. ? Use ropa suelta transpirable. ? No haga actividad fsica cuando las temperaturas son Elise Bennemuy elevadas.  No se deshidrate. Beba mucha agua antes, durante y despus de hacer actividad fsica para Pharmacologistmantener la orina de tono claro o color amarillo plido.  Evite elongar en exceso. Debido a los cambios hormonales que ocurren durante el Raymondvilleembarazo, es fcil elongar WESCO Internationalexcesivamente los msculos, los tendones y los ligamentos.  Si no sola realizar actividad fsica con frecuencia, comience despacio y pdale al mdico que le recomiende los tipos de ejercicios que sean seguros para usted. El embarazo no es la poca indicada para hacer actividad fsica con el fin de bajar de Nara Visapeso. CUNDO DEBO BUSCAR ATENCIN MDICA? Debe suspender la actividad fsica y llamar al mdico si tiene sntomas fuera de lo comn, por ejemplo:  Contracciones uterinas leves o clicos abdominales.  Mareos que no mejoran con el reposo. CUNDO DEBO BUSCAR ASISTENCIA MDICA INMEDIATA? Debe suspender la actividad fsica y llamar a los servicios de Associate Professoremergencia de su localidad (911en los [place]) si tiene sntomas fuera de comn, por ejemplo:  Dolor intenso y repentino en la zona lumbar o en el abdomen.  Contracciones uterinas o clicos abdominales que no mejoran con el reposo.  Dolor en el pecho.  Hemorragia o prdida de lquido por la vagina.  Falta de aire. Esta informacin no tiene Theme park managercomo fin reemplazar el consejo del mdico. Asegrese de hacerle al mdico cualquier pregunta que tenga. Document Released: 08/25/2006 Document Revised: 07/30/2015 Document Reviewed: 01/16/2015 Elsevier Interactive Patient Education  2018 ArvinMeritorElsevier Inc.

## 2017-12-12 ENCOUNTER — Ambulatory Visit (HOSPITAL_COMMUNITY)
Admission: RE | Admit: 2017-12-12 | Discharge: 2017-12-12 | Disposition: A | Discharge status: Home / Self Care | Payer: Self-pay | Source: Ambulatory Visit | Attending: Family Medicine | Admitting: Family Medicine

## 2017-12-12 ENCOUNTER — Other Ambulatory Visit: Payer: Self-pay | Admitting: Family Medicine

## 2017-12-12 DIAGNOSIS — Z3A21 21 weeks gestation of pregnancy: Secondary | ICD-10-CM

## 2017-12-12 DIAGNOSIS — O09299 Supervision of pregnancy with other poor reproductive or obstetric history, unspecified trimester: Secondary | ICD-10-CM

## 2017-12-12 DIAGNOSIS — Z369 Encounter for antenatal screening, unspecified: Secondary | ICD-10-CM

## 2017-12-12 DIAGNOSIS — O0991 Supervision of high risk pregnancy, unspecified, first trimester: Secondary | ICD-10-CM

## 2017-12-12 DIAGNOSIS — Z8632 Personal history of gestational diabetes: Secondary | ICD-10-CM | POA: Insufficient documentation

## 2017-12-12 DIAGNOSIS — Z363 Encounter for antenatal screening for malformations: Secondary | ICD-10-CM | POA: Insufficient documentation

## 2018-01-04 ENCOUNTER — Inpatient Hospital Stay (HOSPITAL_COMMUNITY)
Admission: AD | Admit: 2018-01-04 | Discharge: 2018-01-05 | Disposition: A | Discharge status: Home / Self Care | Payer: Self-pay | Source: Ambulatory Visit | Attending: Obstetrics and Gynecology | Admitting: Obstetrics and Gynecology

## 2018-01-04 ENCOUNTER — Encounter (HOSPITAL_COMMUNITY): Payer: Self-pay

## 2018-01-04 DIAGNOSIS — Z79899 Other long term (current) drug therapy: Secondary | ICD-10-CM | POA: Insufficient documentation

## 2018-01-04 DIAGNOSIS — M25559 Pain in unspecified hip: Secondary | ICD-10-CM

## 2018-01-04 DIAGNOSIS — O9989 Other specified diseases and conditions complicating pregnancy, childbirth and the puerperium: Secondary | ICD-10-CM

## 2018-01-04 DIAGNOSIS — M549 Dorsalgia, unspecified: Secondary | ICD-10-CM | POA: Insufficient documentation

## 2018-01-04 DIAGNOSIS — R1030 Lower abdominal pain, unspecified: Secondary | ICD-10-CM | POA: Insufficient documentation

## 2018-01-04 DIAGNOSIS — O26892 Other specified pregnancy related conditions, second trimester: Secondary | ICD-10-CM | POA: Insufficient documentation

## 2018-01-04 DIAGNOSIS — M25551 Pain in right hip: Secondary | ICD-10-CM | POA: Insufficient documentation

## 2018-01-04 DIAGNOSIS — O99891 Other specified diseases and conditions complicating pregnancy: Secondary | ICD-10-CM

## 2018-01-04 DIAGNOSIS — Z3A24 24 weeks gestation of pregnancy: Secondary | ICD-10-CM | POA: Insufficient documentation

## 2018-01-04 DIAGNOSIS — R109 Unspecified abdominal pain: Secondary | ICD-10-CM

## 2018-01-04 DIAGNOSIS — O26899 Other specified pregnancy related conditions, unspecified trimester: Secondary | ICD-10-CM

## 2018-01-04 DIAGNOSIS — M25552 Pain in left hip: Secondary | ICD-10-CM | POA: Insufficient documentation

## 2018-01-04 LAB — URINALYSIS, ROUTINE W REFLEX MICROSCOPIC
Bilirubin Urine: NEGATIVE
GLUCOSE, UA: 50 mg/dL — AB
Hgb urine dipstick: NEGATIVE
Ketones, ur: NEGATIVE mg/dL
LEUKOCYTES UA: NEGATIVE
NITRITE: NEGATIVE
PH: 7 (ref 5.0–8.0)
Protein, ur: NEGATIVE mg/dL
SPECIFIC GRAVITY, URINE: 1.015 (ref 1.005–1.030)

## 2018-01-04 NOTE — MAU Note (Signed)
Yesterday started having pain in both hips.  Difficult to walk.  Feeling weakness in her left leg.  Took tylenol 4:30.  Also having occasional abdominal tightening.  Pain has been occurring intermittently over past 2 months.  Now has become unbearable.  No VB/LOF.

## 2018-01-04 NOTE — MAU Provider Note (Signed)
History     CSN: [id number]  Arrival date and time: 01/04/18 2255   First Provider Initiated Contact with Patient 01/04/18 2359      Chief Complaint  Patient presents with  . Back Pain   HPI Alexandra Hardy is a 27 y.o. G2P1001 at [redacted]w[redacted]d who presents with back pain, hip pain, & abdominal cramping. Reports low back & bilateral hip pain x 2 months that has worsened this week. Pain is worse if she's been in 1 position for too long. At times makes it difficult to walk. Rates pain 7/10. Took 1 RS tylenol yesterday without relief. Also reports intermittent lower abdominal cramping today. Denies n/v/d, dysuria, LOF, or vaginal bleeding. Has next OB appt tomorrow.  Positive fetal movement.   Video Spanish interpreter used.  OB History    Gravida Para Term Preterm AB Living   2 1 1     1    SAB TAB Ectopic Multiple Live Births         0 1      Past Medical History:  Diagnosis Date  . Gestational diabetes   . History of HPV infection     Past Surgical History:  Procedure Laterality Date  . NO PAST SURGERIES      Family History  Problem Relation Age of Onset  . Diabetes Father     Social History   Tobacco Use  . Smoking status: Never Smoker  . Smokeless tobacco: Never Used  Substance Use Topics  . Alcohol use: No  . Drug use: No    Allergies: No Known Allergies  Medications Prior to Admission  Medication Sig Dispense Refill Last Dose  . acetaminophen (TYLENOL) 325 MG tablet Take 650 mg by mouth every 6 (six) hours as needed for moderate pain or headache.   Taking  . Prenatal Vit-Fe Fumarate-FA (PRENATAL VITAMIN PO) Take 1 tablet by mouth daily.    Taking    Review of Systems  Constitutional: Negative.   Gastrointestinal: Positive for abdominal pain. Negative for diarrhea, nausea and vomiting.  Genitourinary: Negative.   Musculoskeletal: Positive for back pain.   Physical Exam   Blood pressure 110/72, pulse 84, temperature 97.7 F (36.5 C), temperature  source Oral, resp. rate 17, last menstrual period 06/27/2017, currently breastfeeding.  Physical Exam  Nursing note and vitals reviewed. Constitutional: She is oriented to person, place, and time. She appears well-developed and well-nourished. No distress.  HENT:  Head: Normocephalic and atraumatic.  Eyes: Conjunctivae are normal. Right eye exhibits no discharge. Left eye exhibits no discharge. No scleral icterus.  Neck: Normal range of motion.  Respiratory: Effort normal. No respiratory distress.  GI: Soft. There is no tenderness.  Genitourinary:  Genitourinary Comments: Dilation: Closed Effacement (%): Thick Exam by:: Judeth Horn, NP   Neurological: She is alert and oriented to person, place, and time.  Skin: Skin is warm and dry. She is not diaphoretic.  Psychiatric: She has a normal mood and affect. Her behavior is normal. Judgment and thought content normal.    MAU Course  Procedures Results for orders placed or performed during the hospital encounter of 01/04/18 (from the past 24 hour(s))  Urinalysis, Routine w reflex microscopic     Status: Abnormal   Collection Time: 01/04/18 11:02 PM  Result Value Ref Range   Color, Urine YELLOW YELLOW   APPearance HAZY (A) CLEAR   Specific Gravity, Urine 1.015 1.005 - 1.030   pH 7.0 5.0 - 8.0   Glucose, UA 50 (A) NEGATIVE  mg/dL   Hgb urine dipstick NEGATIVE NEGATIVE   Bilirubin Urine NEGATIVE NEGATIVE   Ketones, ur NEGATIVE NEGATIVE mg/dL   Protein, ur NEGATIVE NEGATIVE mg/dL   Nitrite NEGATIVE NEGATIVE   Leukocytes, UA NEGATIVE NEGATIVE  Glucose, capillary     Status: None   Collection Time: 01/05/18 12:13 AM  Result Value Ref Range   Glucose-Capillary 89 65 - 99 mg/dL    MDM NST:  Baseline: 145 bpm, Variability: Good {> 6 bpm), Accelerations: Non-reactive but appropriate for gestational age, Decelerations: Absent and no contractions Cervix closed & no ctx on monitor >500 glucose on urine -- CBG 89 Ibuprofen & flexeril  given prior to discharge Assessment and Plan  A; 1. Pregnancy related hip pain in second trimester, antepartum   2. [redacted] weeks gestation of pregnancy   3. Abdominal cramping affecting pregnancy   4. Back pain affecting pregnancy in second trimester    P: Discharge home Rx flexeril Recommend maternity support belt & OTC meds as needed Work restriction note given Keep scheduled appt tomorrow Discussed reasons to return to MAU  Judeth Hornrin Rosellen Lichtenberger 01/04/2018, 11:59 PM

## 2018-01-05 ENCOUNTER — Ambulatory Visit (INDEPENDENT_AMBULATORY_CARE_PROVIDER_SITE_OTHER): Payer: Self-pay | Admitting: Student

## 2018-01-05 DIAGNOSIS — O0992 Supervision of high risk pregnancy, unspecified, second trimester: Secondary | ICD-10-CM

## 2018-01-05 DIAGNOSIS — M25559 Pain in unspecified hip: Secondary | ICD-10-CM

## 2018-01-05 DIAGNOSIS — R109 Unspecified abdominal pain: Secondary | ICD-10-CM

## 2018-01-05 DIAGNOSIS — O0991 Supervision of high risk pregnancy, unspecified, first trimester: Secondary | ICD-10-CM

## 2018-01-05 DIAGNOSIS — O26892 Other specified pregnancy related conditions, second trimester: Secondary | ICD-10-CM

## 2018-01-05 LAB — GLUCOSE, CAPILLARY: Glucose-Capillary: 89 mg/dL (ref 65–99)

## 2018-01-05 MED ORDER — IBUPROFEN 600 MG PO TABS
600.0000 mg | ORAL_TABLET | Freq: Once | ORAL | Status: AC
  Administered 2018-01-05: 600 mg via ORAL
  Filled 2018-01-05: qty 1

## 2018-01-05 MED ORDER — CYCLOBENZAPRINE HCL 5 MG PO TABS
5.0000 mg | ORAL_TABLET | Freq: Once | ORAL | Status: AC
  Administered 2018-01-05: 5 mg via ORAL
  Filled 2018-01-05: qty 1

## 2018-01-05 MED ORDER — CYCLOBENZAPRINE HCL 5 MG PO TABS: 5.0000 mg | ORAL_TABLET | Freq: Three times a day (TID) | ORAL | 0 refills | Status: DC | PRN

## 2018-01-05 MED ORDER — CYCLOBENZAPRINE HCL 5 MG PO TABS: 5.0000 mg | ORAL_TABLET | Freq: Three times a day (TID) | ORAL | 2 refills | Status: DC | PRN

## 2018-01-05 NOTE — Progress Notes (Signed)
[employer] interpreter Jackie [id number] 

## 2018-01-05 NOTE — Patient Instructions (Signed)
Prueba de tolerancia a la glucosa durante el embarazo Glucose Tolerance Test During Pregnancy La prueba de tolerancia a la glucosa es un anlisis de sangre que se [place] para determinar si ha contrado un tipo de diabetes durante el embarazo (diabetes gestacional). Esto es cuando el cuerpo no procesa de forma Arboriculturistcorrecta el azcar (glucosa) en los alimentos que come, lo cual provoca niveles sanguneos altos de glucosa. Por lo general, la PTG se realiza despus de haberse hecho una prueba de glucosa de 1 hora, cuyos resultados indican que posiblemente tiene diabetes gestacional. Tambin se puede hacer si:  Tiene antecedentes de haber parido bebs muy grandes o antecedentes de muerte fetal repetida (beb nacido muerto).  Tiene signos o sntomas de diabetes, tales como: ? Cambios en la visin. ? Hormigueo o adormecimiento en las manos o los pies. ? Cambios en el hambre, la sed y la miccin que no se explican por Firefighterel embarazo.  La PTG dura unas 3 horas. Le darn para beber una solucin de agua y azcar al principio de la prueba. Le extraern sangre antes de que beba la solucin, y 1, 2 y 3 horas despus de beberla. No se le permitir comer ni beber nada ms durante la prueba. Debe Geneticist, molecularpermanecer en el lugar en que se realiza la prueba para asegurarse de que la sangre se extraiga puntualmente. Tambin debe evitar realizar ejercicios durante la prueba porque esto puede AutoZonealterar los resultados. Cmo debo prepararme para esta prueba? Coma normalmente durante 3 das antes de la PTG e incluya muchos alimentos ricos en carbohidratos. No coma ni beba nada, excepto agua, durante las ltimas 12 horas antes de la prueba. Adems, el mdico puede pedirle que deje de tomar ciertos medicamentos antes de la prueba. Qu significan los resultados? Es su responsabilidad retirar el resultado del [place]ud[place]. Consulte en el laboratorio o en el departamento en el que fue realizado el estudio cundo y cmo podr Starbucks Corporationobtener los resultados.  Comunquese con el mdico si tiene Smith Internationalpreguntas sobre los resultados. Rango de Circuit Cityvalores normales Los rangos para los valores normales pueden variar entre diferentes laboratorios y hospitales. Siempre debe consultar a su mdico despus de realizarse un anlisis u otros estudios para saber si los valores de sus [place]resultados se consideran dentro de los lmites normales. Los niveles normales de glucemia son los siguientes:  Ayuno: menos de 105mg /dl.  Una hora despus de beber la solucin: menos de 190mg /dl.  Dos horas despus de beber la solucin: menos de 165mg /dl.  Tres horas despus de beber la solucin: menos de 145mg /dl.  Algunas sustancias pueden AutoZonealterar los resultados de la PTG. Estos pueden incluir lo siguiente:  Medicamentos para la presin arterial y la insuficiencia cardaca, como betabloqueantes, furosemida y tiacidas.  Antiinflamatorios, como aspirina.  La nicotina.  Algunos medicamentos psiquitricos.  Significado de los Ball Corporationresultados que estn fuera de los rangos de los valores normales Los resultados de la PTG por debajo de los valores normales pueden indicar varios problemas de [place]ud, tales como:  Diabetes gestacional.  Respuesta al estrs agudo.  Sndrome de [place].  Tumores como feocromocitoma o glucagonoma.  Problemas renales de larga duracin.  Pancreatitis.  Hipertiroidismo.  Infeccin actual.  Hable con el mdico Dole Foodsobre los resultados. El mdico Calpine Corporationutilizar los resultados para Education officer, environmentalrealizar un diagnstico y Chief Strategy Officerdeterminar un plan de tratamiento adecuado para usted. Esta informacin no tiene Theme park managercomo fin reemplazar el consejo del mdico. Asegrese de hacerle al mdico cualquier pregunta que tenga. Document Released: 05/09/2012 Document Revised: 01/27/2017 Document Reviewed: 03/15/2014 Elsevier Interactive Patient Education  2018 Elsevier Inc. Engineer, agricultural (Sciatica) La citica es el dolor, la debilidad, el entumecimiento o el hormigueo a lo largo del nervio citico. El  nervio citico comienza en la parte inferior de la espalda y desciende por la parte posterior de cada pierna. La citica se produce cuando este nervio se comprime o se ejerce presin sobre l. Suele desaparecer por s sola o con tratamiento. A veces, la citica puede volver a aparecer. CUIDADOS EN EL HOGAR Medicamentos  [employer] de venta libre y los recetados solamente como se lo haya indicado el mdico.  No conduzca ni use maquinaria pesada mientras toma analgsicos recetados. Control del dolor  Si se lo indican, aplique hielo sobre la zona afectada. ? Ponga el hielo en una bolsa plstica. ? Coloque una [employer] piel y la bolsa de hielo. ? Coloque el hielo durante , 2 a 3veces por da.  Despus del hielo, aplique calor sobre la zona afectada antes de hacer ejercicio o con la frecuencia que le haya indicado el mdico. Use la fuente de calor que el mdico le indique, por ejemplo, una compresa de calor hmedo o una almohadilla trmica. ? Coloque una [employer] piel y la fuente de Airline pilot. ? Aplique el calor durante 20 a . ? Retire la fuente de calor si la piel se le pone de color rojo brillante. Esto es muy importante si no puede sentir el dolor, el calor o el fro. Puede correr un riesgo mayor de sufrir quemaduras. Actividad  Retome sus actividades habituales como se lo haya indicado el mdico. Pregntele al mdico qu actividades son seguras para usted. ? Evite las actividades que empeoran la citica.  Descanse por breves perodos Administrator. Hgalo recostado o de pie. Esto suele ser mejor que descansar sentado. ? Cuando descanse durante perodos ms largos, haga alguna actividad fsica o un estiramiento entre los perodos de descanso. ? Evite estar sentado durante largos perodos sin moverse. Levntese y [place] al menos una vez cada hora.  Haga ejercicios y estrese con regularidad, como se lo indic el mdico.  No levante nada que pese  ms de 10libras (4,5kg) mientras tenga sntomas de citica. ? Aunque no tenga sntomas, evite levantar objetos pesados. ? Evite levantar objetos pesados de forma repetida.  Al levantar objetos, hgalos siempre de una forma que sea segura para su cuerpo. Para esto, debe hacer lo siguiente: ? Flexione las rodillas. ? Mantenga el objeto cerca del cuerpo. ? No se tuerza. Instrucciones generales  Mantenga una buena postura. ? Evite reclinarse hacia adelante cuando est sentado. ? Evite encorvar la espalda mientras est de pie.  Mantenga un peso saludable.  Use calzado cmodo, que le d soporte al pie. Evite usar tacones.  Evite dormir sobre un colchn que sea demasiado blando o demasiado duro. Es posible que sienta menos dolor si duerme en un colchn con apoyo suficientemente firme para la espalda.  Concurra a todas las visitas de control como se lo haya indicado el mdico. Esto es importante. SOLICITE AYUDA SI:  Tiene un dolor con estas caractersticas: ? Lo despierta cuando est dormido. ? Empeora al estar recostado. ? Es Government social research officer que tena en el pasado. ? Dura ms de 4semanas.  Pierde peso sin proponrselo. SOLICITE AYUDA DE INMEDIATO SI:  No puede controlar la orina (miccin) ni la evacuacin de la materia fecal (defecacin).  Tiene debilidad en alguna de estas zonas, y la debilidad empeora. ? La parte inferior de la espalda. ?  La parte inferior del abdomen (pelvis). ? Los glteos. ? Las piernas.  Siente irritacin o inflamacin en la espalda.  Tiene sensacin de ardor al ConocoPhillipsorinar. Esta informacin no tiene Theme park managercomo fin reemplazar el consejo del mdico. Asegrese de hacerle al mdico cualquier pregunta que tenga. Document Released: 12/11/2010 Document Revised: 03/01/2016 Document Reviewed: 07/18/2015 Elsevier Interactive Patient Education  [employer]2018 Elsevier Inc.

## 2018-01-05 NOTE — Discharge Instructions (Signed)
Dolor de espalda durante el embarazo (Back Pain in Pregnancy) El dolor de espalda es habitual durante el embarazo. Puede deberse a varios factores relacionados con los cambios durante esta etapa. INSTRUCCIONES PARA EL CUIDADO EN EL HOGAR Control del dolor, la rigidez y la hinchazn  Si se lo indican, aplique hielo en caso de dolor de espalda repentino (agudo). ? Ponga el hielo en una bolsa plstica. ? Coloque una toalla entre la piel y la bolsa de hielo. ? Coloque el hielo durante 20minutos, 2 a 3veces por da.  Si se lo indican, aplique calor en la zona afectada antes de realizar ejercicios: ? Coloque una toalla entre la piel y la compresa de calor o la almohadilla trmica. ? Aplique el calor durante 20 a 30minutos. ? Retire la fuente de calor si la piel se le pone de color rojo brillante. Esto es muy importante si no puede sentir el dolor, el calor o el fro. Puede correr un riesgo mayor de sufrir quemaduras. Actividad  Haga ejercicio como se lo haya indicado el mdico. Hacer ejercicio es la mejor forma de evitar o controlar el dolor de espalda.  Prstele atencin a su cuerpo cuando se levante. Si siente dolor al levantarse, pida ayuda o flexione las rodillas. De este modo, se usan los msculos de las piernas en lugar de los de la espalda.  Pngase en cuclillas al levantar algo del suelo. No se agache.  Haga reposo en cama nicamente como se lo haya indicado el mdico. El reposo en cama solo debe hacerse cuando los episodios de dolor de espalda son ms intensos. Pararse, sentarse y acostarse  No permanezca sentada o de pie en el mismo lugar durante largos perodos.  Cuando est sentada, adopte una postura correcta. Asegrese de que su cabeza descansa sobre sus hombros y no est colgando hacia delante. Use una almohada en la parte inferior de la espalda si es necesario.  Trate de dormir de lado, de preferencia el lado izquierdo, con una o dos almohadas entre las piernas. Si est  dolorida despus de una noche de descanso, la cama puede ser demasiado blanda. Un colchn duro puede brindarle ms apoyo para la espalda durante el embarazo. Instrucciones generales  No use zapatos con tacones altos.  Consumir una dieta saludable. Trate de aumentar de peso dentro de las recomendaciones del mdico.  Use una faja de maternidad, un arns elstico o un cors para la espalda como se lo haya indicado el mdico.  Tome los medicamentos de venta libre y los recetados solamente como se lo haya indicado el mdico.  Concurra a todas las visitas de control como se lo haya indicado el mdico. Esto es importante. Incluye las visitas a los especialistas, como un fisioterapeuta. SOLICITE ATENCIN MDICA SI:  El dolor de espalda le impide realizar las actividades cotidianas.  Aumenta el dolor en otras partes del cuerpo. SOLICITE ATENCIN MDICA DE INMEDIATO SI:  Siente entumecimiento, hormigueo, debilidad o problemas con el uso de los brazos o las piernas.  Siente un dolor de espalda intenso que no puede controlar con los medicamentos.  Tiene modificaciones repentinas en el control de la vejiga o el intestino.  Siente que le falta el aire, se marea o se desmaya.  Tiene nuseas, vmitos o sudoracin.  Siente un dolor de espalda que es rtmico y de tipo clico, similar a las contracciones del parto. Las contracciones del parto suelen aparecer cada 1 a 2minutos, duran aproximadamente 1minuto y estn acompaadas de una sensacin de empujar o   de presin en la pelvis.  Tiene dolor de espalda y rompe la bolsa de las aguas o tiene sangrado vaginal.  El dolor o el adormecimiento se extienden hacia la pierna.  El dolor aparece despus de una cada.  Siente dolor de un solo lado.  Observa sangre en la orina.  Le aparecen ampollas en la piel en la zona del dolor de espalda. Esta informacin no tiene como fin reemplazar el consejo del mdico. Asegrese de hacerle al mdico cualquier  pregunta que tenga. Document Released: 07/21/2011 Document Revised: 03/01/2016 Document Reviewed: 07/23/2015 Elsevier Interactive Patient Education  2018 Elsevier Inc.  

## 2018-01-06 NOTE — Progress Notes (Signed)
Patient ID: Erick AlleyMaria Gonzalez Zepahua, female   DOB: 20-Dec-1990, 27 y.o.   MRN: [id number]   PRENATAL VISIT NOTE  Subjective:  Erick AlleyMaria Gonzalez Zepahua is a 27 y.o. G2P1001 at 6654w6d being seen today for ongoing prenatal care.  She is currently monitored for the following issues for this low-risk pregnancy and has Language barrier affecting health care; Pap smear of cervix shows high risk HPV present; Vaginal bleeding in pregnancy, first trimester; Supervision of high risk pregnancy, antepartum, first trimester; and History of gestational diabetes in prior pregnancy, currently pregnant on their problem list.  Patient reports back and leg pain. She was seen in the MAU last night and given Flexeril for sciatica and leg and back pain. She is feeling better now.  Contractions: Not present. Vag. Bleeding: None.  Movement: Present. Denies leaking of fluid.   The following portions of the patient's history were reviewed and updated as appropriate: allergies, current medications, past family history, past medical history, past social history, past surgical history and problem list. Problem list updated.  Objective:   Vitals:   01/05/18 1552  BP: (!) 95/51  Pulse: 72  Weight: 142 lb 6.4 oz (64.6 kg)    Fetal Status: Fetal Heart Rate (bpm): 140   Movement: Present     General:  Alert, oriented and cooperative. Patient is in no acute distress.  Skin: Skin is warm and dry. No rash noted.   Cardiovascular: Normal heart rate noted  Respiratory: Normal respiratory effort, no problems with respiration noted  Abdomen: Soft, gravid, appropriate for gestational age.  Pain/Pressure: Present     Pelvic: Cervical exam deferred        Extremities: Normal range of motion.  Edema: None  Mental Status:  Normal mood and affect. Normal behavior. Normal judgment and thought content.   Assessment and Plan:  Pregnancy: G2P1001 at 5554w6d  1. Supervision of high risk pregnancy, antepartum, first trimester  - US MFM OB  FOLLOW UP; Future  2. Explained procedure for 2 hour Gtt next visit 3. Reviewed with her sciatica and low back pain; encouraged her to purchase maternity belt, follow exercises for stretching and movement.   Preterm labor symptoms and general obstetric precautions including but not limited to vaginal bleeding, contractions, leaking of fluid and fetal movement were reviewed in detail with the patient. Please refer to After Visit Summary for other counseling recommendations.  Return in about 4 weeks (around 02/02/2018), or LROB and 2 hour gtt.   Marylene LandKathryn Lorraine Naureen Benton, CNM

## 2018-01-12 ENCOUNTER — Ambulatory Visit (HOSPITAL_COMMUNITY): Admission: RE | Admit: 2018-01-12 | Payer: Self-pay | Source: Ambulatory Visit

## 2018-01-13 ENCOUNTER — Ambulatory Visit (HOSPITAL_COMMUNITY)
Admission: RE | Admit: 2018-01-13 | Discharge: 2018-01-13 | Disposition: A | Discharge status: Home / Self Care | Payer: Self-pay | Source: Ambulatory Visit | Attending: Student | Admitting: Student

## 2018-01-13 ENCOUNTER — Other Ambulatory Visit: Payer: Self-pay | Admitting: Student

## 2018-01-13 DIAGNOSIS — Z8632 Personal history of gestational diabetes: Secondary | ICD-10-CM

## 2018-01-13 DIAGNOSIS — O09299 Supervision of pregnancy with other poor reproductive or obstetric history, unspecified trimester: Secondary | ICD-10-CM

## 2018-01-13 DIAGNOSIS — Z3A26 26 weeks gestation of pregnancy: Secondary | ICD-10-CM | POA: Insufficient documentation

## 2018-01-13 DIAGNOSIS — Z362 Encounter for other antenatal screening follow-up: Secondary | ICD-10-CM

## 2018-01-13 DIAGNOSIS — O09292 Supervision of pregnancy with other poor reproductive or obstetric history, second trimester: Secondary | ICD-10-CM | POA: Insufficient documentation

## 2018-01-13 DIAGNOSIS — O0991 Supervision of high risk pregnancy, unspecified, first trimester: Secondary | ICD-10-CM

## 2018-02-09 ENCOUNTER — Other Ambulatory Visit: Payer: Self-pay

## 2018-02-09 ENCOUNTER — Ambulatory Visit (INDEPENDENT_AMBULATORY_CARE_PROVIDER_SITE_OTHER): Payer: Self-pay | Admitting: Student

## 2018-02-09 VITALS — BP 97/54 | HR 80 | Wt 148.0 lb

## 2018-02-09 DIAGNOSIS — O0993 Supervision of high risk pregnancy, unspecified, third trimester: Secondary | ICD-10-CM

## 2018-02-09 DIAGNOSIS — Z23 Encounter for immunization: Secondary | ICD-10-CM

## 2018-02-09 DIAGNOSIS — O0991 Supervision of high risk pregnancy, unspecified, first trimester: Secondary | ICD-10-CM

## 2018-02-09 DIAGNOSIS — O0990 Supervision of high risk pregnancy, unspecified, unspecified trimester: Secondary | ICD-10-CM

## 2018-02-09 NOTE — Patient Instructions (Signed)
Prueba de tolerancia a la glucosa durante el embarazo Glucose Tolerance Test During Pregnancy La prueba de tolerancia a la glucosa es un anlisis de sangre que se [place] para determinar si ha contrado un tipo de diabetes durante el embarazo (diabetes gestacional). Esto es cuando el cuerpo no procesa de forma Arboriculturistcorrecta el azcar (glucosa) en los alimentos que come, lo cual provoca niveles sanguneos altos de glucosa. Por lo general, la PTG se realiza despus de haberse hecho una prueba de glucosa de 1 hora, cuyos resultados indican que posiblemente tiene diabetes gestacional. Tambin se puede hacer si:  Tiene antecedentes de haber parido bebs muy grandes o antecedentes de muerte fetal repetida (beb nacido muerto).  Tiene signos o sntomas de diabetes, tales como: ? Cambios en la visin. ? Hormigueo o adormecimiento en las manos o los pies. ? Cambios en el hambre, la sed y la miccin que no se explican por Firefighterel embarazo.  La PTG dura unas 3 horas. Le darn para beber una solucin de agua y azcar al principio de la prueba. Le extraern sangre antes de que beba la solucin, y 1, 2 y 3 horas despus de beberla. No se le permitir comer ni beber nada ms durante la prueba. Debe Geneticist, molecularpermanecer en el lugar en que se realiza la prueba para asegurarse de que la sangre se extraiga puntualmente. Tambin debe evitar realizar ejercicios durante la prueba porque esto puede AutoZonealterar los resultados. Cmo debo prepararme para esta prueba? Coma normalmente durante 3 das antes de la PTG e incluya muchos alimentos ricos en carbohidratos. No coma ni beba nada, excepto agua, durante las ltimas 12 horas antes de la prueba. Adems, el mdico puede pedirle que deje de tomar ciertos medicamentos antes de la prueba. Qu significan los resultados? Es su responsabilidad retirar el resultado del [place]udio. Consulte en el laboratorio o en el departamento en el que fue realizado el estudio cundo y cmo podr Starbucks Corporationobtener los resultados.  Comunquese con el mdico si tiene Smith Internationalpreguntas sobre los resultados. Rango de Circuit Cityvalores normales Los rangos para los valores normales pueden variar entre diferentes laboratorios y hospitales. Siempre debe consultar a su mdico despus de realizarse un anlisis u otros estudios para saber si los valores de sus [place]resultados se consideran dentro de los lmites normales. Los niveles normales de glucemia son los siguientes:  Ayuno: menos de 105mg /dl.  Una hora despus de beber la solucin: menos de 190mg /dl.  Dos horas despus de beber la solucin: menos de 165mg /dl.  Tres horas despus de beber la solucin: menos de 145mg /dl.  Algunas sustancias pueden AutoZonealterar los resultados de la PTG. Estos pueden incluir lo siguiente:  Medicamentos para la presin arterial y la insuficiencia cardaca, como betabloqueantes, furosemida y tiacidas.  Antiinflamatorios, como aspirina.  La nicotina.  Algunos medicamentos psiquitricos.  Significado de los Ball Corporationresultados que estn fuera de los rangos de los valores normales Los resultados de la PTG por debajo de los valores normales pueden indicar varios problemas de [place]ud, tales como:  Diabetes gestacional.  Respuesta al estrs agudo.  Sndrome de [place].  Tumores como feocromocitoma o glucagonoma.  Problemas renales de larga duracin.  Pancreatitis.  Hipertiroidismo.  Infeccin actual.  Hable con el mdico Dole Foodsobre los resultados. El mdico Calpine Corporationutilizar los resultados para Education officer, environmentalrealizar un diagnstico y Chief Strategy Officerdeterminar un plan de tratamiento adecuado para usted. Esta informacin no tiene Theme park managercomo fin reemplazar el consejo del mdico. Asegrese de hacerle al mdico cualquier pregunta que tenga. Document Released: 05/09/2012 Document Revised: 01/27/2017 Document Reviewed: 03/15/2014 Elsevier Interactive Patient Education  2018 Elsevier Inc.  

## 2018-02-09 NOTE — Progress Notes (Signed)
[place] interpreter Carol 

## 2018-02-09 NOTE — Progress Notes (Signed)
   PRENATAL VISIT NOTE  Subjective:  Alexandra Hardy is a 27 y.o. G2P1001 at 43103w5d being seen today for ongoing prenatal care.  She is currently monitored for the following issues for this high-risk pregnancy and has Language barrier affecting health care; Pap smear of cervix shows high risk HPV present; Vaginal bleeding in pregnancy, first trimester; Supervision of high risk pregnancy, antepartum, first trimester; and History of gestational diabetes in prior pregnancy, currently pregnant on their problem list.  Patient reports no complaints.  Contractions: Not present. Vag. Bleeding: None.  Movement: Present. Denies leaking of fluid.   The following portions of the patient's history were reviewed and updated as appropriate: allergies, current medications, past family history, past medical history, past social history, past surgical history and problem list. Problem list updated.  Objective:   Vitals:   02/09/18 0835  BP: (!) 97/54  Pulse: 80  Weight: 148 lb (67.1 kg)    Fetal Status: Fetal Heart Rate (bpm): 161   Movement: Present     General:  Alert, oriented and cooperative. Patient is in no acute distress.  Skin: Skin is warm and dry. No rash noted.   Cardiovascular: Normal heart rate noted  Respiratory: Normal respiratory effort, no problems with respiration noted  Abdomen: Soft, gravid, appropriate for gestational age.  Pain/Pressure: Present     Pelvic: Cervical exam deferred        Extremities: Normal range of motion.  Edema: Trace  Mental Status:  Normal mood and affect. Normal behavior. Normal judgment and thought content.   Assessment and Plan:  Pregnancy: G2P1001 at 40103w5d  1. Supervision of high risk pregnancy, antepartum -Reviewed US results.  -Discussed circumcision, patient will talk with her partner. Info given.  - Tdap vaccine greater than or equal to 7yo IM  2. Supervision of high risk pregnancy, antepartum, first trimester -2 hour gtt today -28 weeks  lab today  Preterm labor symptoms and general obstetric precautions including but not limited to vaginal bleeding, contractions, leaking of fluid and fetal movement were reviewed in detail with the patient. Please refer to After Visit Summary for other counseling recommendations.  Return in about 2 weeks (around 02/23/2018).   Marylene LandKathryn Lorraine Kooistra, CNM

## 2018-02-10 LAB — CBC
HEMATOCRIT: 36.7 % (ref 34.0–46.6)
HEMOGLOBIN: 12 g/dL (ref 11.1–15.9)
MCH: 32.1 pg (ref 26.6–33.0)
MCHC: 32.7 g/dL (ref 31.5–35.7)
MCV: 98 fL — ABNORMAL HIGH (ref 79–97)
Platelets: 195 10*3/uL (ref 150–379)
RBC: 3.74 x10E6/uL — AB (ref 3.77–5.28)
RDW: 13.7 % (ref 12.3–15.4)
WBC: 5.8 10*3/uL (ref 3.4–10.8)

## 2018-02-10 LAB — HIV ANTIBODY (ROUTINE TESTING W REFLEX): HIV SCREEN 4TH GENERATION: NONREACTIVE

## 2018-02-10 LAB — RPR: RPR: NONREACTIVE

## 2018-02-13 ENCOUNTER — Other Ambulatory Visit: Payer: Self-pay

## 2018-02-13 DIAGNOSIS — O0990 Supervision of high risk pregnancy, unspecified, unspecified trimester: Secondary | ICD-10-CM

## 2018-02-14 LAB — GLUCOSE TOLERANCE, 2 HOURS W/ 1HR
GLUCOSE, FASTING: 79 mg/dL (ref 65–91)
Glucose, 1 hour: 137 mg/dL (ref 65–179)
Glucose, 2 hour: 136 mg/dL (ref 65–152)

## 2018-02-23 ENCOUNTER — Encounter: Payer: Self-pay | Admitting: Student

## 2018-02-24 ENCOUNTER — Ambulatory Visit (INDEPENDENT_AMBULATORY_CARE_PROVIDER_SITE_OTHER): Payer: Self-pay | Admitting: Student

## 2018-02-24 ENCOUNTER — Encounter: Payer: Self-pay | Admitting: Student

## 2018-02-24 VITALS — BP 102/59 | HR 90 | Wt 150.2 lb

## 2018-02-24 DIAGNOSIS — O0991 Supervision of high risk pregnancy, unspecified, first trimester: Secondary | ICD-10-CM

## 2018-02-24 DIAGNOSIS — Z603 Acculturation difficulty: Secondary | ICD-10-CM

## 2018-02-24 DIAGNOSIS — Z789 Other specified health status: Secondary | ICD-10-CM

## 2018-02-24 NOTE — Patient Instructions (Addendum)
Eleccin del mtodo anticonceptivo Contraception Choices La anticoncepcin, o los mtodos anticonceptivos, hace referencia a los mtodos o dispositivos que evitan el [place]. Mtodos hormonales Implante anticonceptivo Un implante anticonceptivo consiste en un tubo plstico delgado que contiene una hormona. Se inserta en la parte superior del brazo. Puede Consulting civil engineer por 3 aos. Inyecciones de progestina sola Las inyecciones de progestina sola contienen progestina, una forma sinttica de la hormona progesterona. Un mdico las administra cada 3 meses. Pldoras anticonceptivas Las pldoras anticonceptivas son pastillas que contienen hormonas que evitan el [place]. Deben tomarse una vez al da, preferentemente a la misma Economist. Parches anticonceptivos El parche anticonceptivo contiene hormonas que evitan el [place]. Se coloca en la piel, debe cambiarse una vez a la semana durante tres semanas y debe retirarse en la cuarta semana. Se necesita una receta para utilizar este mtodo anticonceptivo. Anillo vaginal Un anillo vaginal contiene hormonas que evitan el embarazo. Se coloca en la vagina durante tres semanas y se retira en la cuarta semana. Luego se repite el proceso con un anillo nuevo. Se necesita una receta para utilizar este mtodo anticonceptivo. Anticonceptivo de Associate Professor Los anticonceptivos de emergencia evitan el embarazo despus de Warehouse manager sexo sin proteccin. Vienen en forma de pldora y pueden tomarse hasta 5 das despus de [place]. Funcionan mejor cuando se toman lo ms pronto posible luego de [employer]. La mayora de los anticonceptivos de emergencia estn disponibles sin receta mdica. Este mtodo no debe utilizarse como el nico mtodo anticonceptivo. Mtodos de barrera Preservativo masculino Un preservativo masculino es una vaina delgada que se coloca sobre el pene durante el sexo. Los preservativos evitan que el esperma ingrese en el cuerpo de la  [place]. Pueden utilizarse con un espermicida para aumentar la efectividad. Deben desecharse luego de su uso. Preservativo femenino Un preservativo femenino es una vaina blanda y holgada que se coloca en la vagina antes de [place]. El preservativo evita que el esperma ingrese en el cuerpo de la [place]. Deben desecharse luego de su uso. Diafragma Un diafragma es una barrera blanda con forma de cpula. Se inserta en la vagina antes del sexo, junto con un espermicida. El diafragma bloquea el ingreso de esperma en el tero, y el espermicida mata a los espermatozoides. El Designer, fashion/clothing en la vagina durante 6 a 8 horas despus de Warehouse manager sexo y debe retirarse en el plazo de las 24 horas. Un diafragma es recetado y colocado por un mdico. Debe reemplazarse cada 1 a 2 aos, despus de dar a luz, de aumentar ms de 15lb (6.8kg) y de [place] plvica. Capuchn cervical Un capuchn cervical es una copa redonda y blanda de ltex o plstico que se coloca en el cuello uterino. Se inserta en la vagina antes del sexo, junto con un espermicida. Bloquea el ingreso del esperma en el tero. El capuchn Radio producer durante 6 a 8 horas despus de Warehouse manager sexo y debe retirarse en el plazo de las 48 horas. Un capuchn cervical debe ser recetado y colocado por un mdico. Debe reemplazarse cada 2aos. Esponja Una esponja es una pieza blanda y circular de espuma de poliuretano que contiene espermicida. La esponja ayuda a bloquear el ingreso de esperma en el tero, y el espermicida mata a los espermatozoides. Belva Bertin, debe humedecerla e insertarla en la vagina. Debe insertarse antes de [employer], debe permanecer dentro al menos durante 6 horas despus de [place] sexo y debe retirarse y Nurse, adult en  el plazo de las 30 horas. Espermicidas Los espermicidas son sustancias qumicas que matan o bloquean al esperma y no lo dejan ingresar al cuello uterino y al tero. Vienen en forma de crema, gel,  supositorio, espuma o comprimido. Un espermicida debe insertarse en la vagina con un aplicador al menos 10 o 15 minutos antes de tener sexo para dar tiempo a que surta [place]. El proceso debe repetirse cada vez que tenga sexo. Los espermicidas no requieren Emergency planning/management officer. Anticonceptivos intrauterinos Dispositivo intrauterino (DIU). Un DIU un dispositivo en forma de T que se coloca en el tero. Hay dos tipos:  DIU hormonal. Este tipo contiene progestina, una forma sinttica de la hormona progesterona. Este tipo puede permanecer colocado durante 3 a 5 aos.  DIU de cobre. Este tipo est recubierto con un alambre de cobre. Puede permanecer colocado durante 10 aos.  Mtodos anticonceptivos permanentes Ligadura de trompas en la mujer En este mtodo, se sellan, atan u obstruyen las trompas de [place] durante una ciruga para Automotive engineer que el vulo descienda [place]. Esterilizacin histeroscpica En este mtodo, se coloca un implante pequeo y flexible dentro de cada trompa de [place]. Los implantes hacen que se forme un tejido cicatricial en las trompas de [place] y que las obstruya para que el espermatozoide no pueda llegar al vulo. El procedimiento demora alrededor de 3 meses para que sea [place]. Debe utilizarse otro mtodo anticonceptivo durante esos 3 meses. Esterilizacin masculina Este es un procedimiento que consiste en atar los conductos que transportan el esperma (vasectoma). Luego del procedimiento, el hombre Manufacturing engineer lquido (semen). Mtodos de planificacin natural Planificacin familiar natural En este mtodo, la pareja no tiene [employer] la mujer podra quedar [place]. Mtodo calendario Marketing executive un seguimiento de la duracin de cada ciclo menstrual, identificar los [employer] que se puede producir un Psychiatrist y no Warehouse manager sexo durante esos [place]. Mtodo de la ovulacin En este mtodo, la pareja evita tener sexo durante la  ovulacin. Mtodo sintotrmico Este mtodo implica no tener sexo durante la ovulacin. Normalmente, la mujer comprueba la ovulacin al observar cambios en su temperatura y en la consistencia del moco cervical. Mtodo posovulacin En este mtodo, la pareja espera a que finalice la ovulacin para Doctor, hospital. Resumen  La anticoncepcin, o los mtodos anticonceptivos, hace referencia a los mtodos o dispositivos que evitan el [place].  Los mtodos anticonceptivos hormonales incluyen implantes, inyecciones, pastillas, parches, anillos vaginales y anticonceptivos de Associate Professor.  Los mtodos anticonceptivos de barrera pueden incluir preservativos masculinos, preservativos femeninos, diafragmas, capuchones cervicales, esponjas y espermicidas.  Hay dos tipos de DIU (dispositivos intrauterinos). Un DIU puede colocarse en el tero de una mujer para evitar el embarazo durante 3 a 5 aos.  La esterilizacin permanente puede realizarse mediante un procedimiento para hombres, mujeres o ambos.  Los [employer] de Medical sales representative natural incluyen no tener [employer] la mujer podra quedar [place]. Esta informacin no tiene Theme park manager el consejo del mdico. Asegrese de hacerle al mdico cualquier pregunta que tenga. Document Released: 11/08/2005 Document Revised: 02/28/2017 Document Reviewed: 02/28/2017 Elsevier Interactive Patient Education  2018 [employer].      Dolor de espalda durante el embarazo (Back Pain in Pregnancy) El dolor de espalda es habitual durante el [place]. Puede deberse a varios factores relacionados con los cambios durante esta etapa. INSTRUCCIONES PARA EL CUIDADO EN EL HOGAR Control del dolor, la rigidez y la hinchazn  Si se lo indican, aplique  hielo en caso de dolor de espalda repentino (agudo). ? Ponga el hielo en una bolsa plstica. ? Coloque una FirstEnergy Corptoalla entre la piel y la bolsa de hielo. ? Coloque el hielo durante 20minutos, 2 a  3veces por da.  Si se lo indican, aplique calor en la zona afectada antes de realizar ejercicios: ? Coloque una toalla entre la piel y la compresa de calor o la almohadilla trmica. ? Aplique el calor durante 20 a 30minutos. ? Retire la fuente de calor si la piel se le pone de color rojo brillante. Esto es muy importante si no puede sentir el dolor, el calor o el fro. Puede correr un riesgo mayor de sufrir quemaduras. Actividad  Haga ejercicio como se lo haya indicado el mdico. Hacer ejercicio es la mejor forma de Automotive engineerevitar o Human resources officercontrolar el dolor de espalda.  Prstele atencin a su cuerpo cuando se levante. Si siente dolor al levantarse, pida ayuda o flexione las rodillas. Liberty GlobalDe este modo, se usan los msculos de las piernas en lugar de los de la espalda.  Pngase en cuclillas al levantar algo del suelo. No se agache.  Haga reposo en cama nicamente como se lo haya indicado el mdico. El reposo en cama solo debe hacerse cuando los episodios de dolor de espalda son ms intensos. Pararse, sentarse y acostarse  No permanezca sentada o de pie en el mismo lugar durante largos perodos.  Cuando est sentada, adopte una Education officer, museumpostura correcta. Asegrese de que su cabeza descansa sobre sus hombros y no est colgando hacia delante. Use una almohada en la parte inferior de la espalda si es necesario.  Trate de dormir de lado, de preferencia el lado izquierdo, con una o The PNC Financialdos almohadas entre las piernas. Si est dolorida despus de una noche de descanso, la cama puede ser OGE Energydemasiado blanda. Un colchn duro puede brindarle ms apoyo para la Merchandiser, retailespalda durante el embarazo. Instrucciones generales  No use zapatos con tacones altos.  Consumir una dieta saludable. Trate de aumentar de peso dentro de las recomendaciones del mdico.  Use una faja de maternidad, un arns elstico o un cors para la espalda como se lo haya indicado el mdico.  Tome los medicamentos de venta libre y los recetados solamente como se lo haya  indicado el mdico.  OceanographerConcurra a todas las visitas de control como se lo haya indicado el mdico. Esto es importante. Incluye las visitas a los especialistas, como un fisioterapeuta. SOLICITE ATENCIN MDICA SI:  El dolor de espalda le impide realizar las actividades cotidianas.  Aumenta el dolor en otras partes del cuerpo. SOLICITE ATENCIN MDICA DE INMEDIATO SI:  Siente entumecimiento, hormigueo, debilidad o problemas con el uso de los brazos o las piernas.  Siente un dolor de espalda intenso que no puede controlar con los medicamentos.  Tiene modificaciones repentinas en el control de la vejiga o el intestino.  Siente que le falta el aire, se marea o se desmaya.  Tiene nuseas, vmitos o sudoracin.  Siente un dolor de espalda que es rtmico y de tipo clico, similar a las contracciones del [place] Centerparto. Las contracciones del parto suelen aparecer cada 1 a 2minutos, duran aproximadamente 1minuto y estn acompaadas de una sensacin de empujar o de presin en la pelvis.  Tiene dolor de espalda y rompe la bolsa de las aguas o tiene sangrado vaginal.  El dolor o el adormecimiento se extienden hacia la pierna.  El dolor aparece despus de una cada.  Siente dolor de un solo lado.  Maxie Betterbserva sangre en  la [place].  Le aparecen ampollas en la piel en la zona del dolor de espalda. Esta informacin no tiene Theme park manager el consejo del mdico. Asegrese de hacerle al mdico cualquier pregunta que tenga. Document Released: 07/21/2011 Document Revised: 03/01/2016 Document Reviewed: 07/23/2015 Elsevier Interactive Patient Education  [employer].

## 2018-02-24 NOTE — Progress Notes (Signed)
   PRENATAL VISIT NOTE  Subjective:  Alexandra Hardy is a [age] y.o. G2P1001 at 3861w6d being seen today for ongoing prenatal care.  She is currently monitored for the following issues for this low-risk pregnancy and has Language barrier affecting health care; Pap smear of cervix shows high risk HPV present; Supervision of high risk pregnancy, antepartum, first trimester; and History of gestational diabetes in prior pregnancy, currently pregnant on their problem list.  Patient reports backache that is worse with movement.  Contractions: Irregular.  .  Movement: Present. Denies leaking of fluid.   The following portions of the patient's history were reviewed and updated as appropriate: allergies, current medications, past family history, past medical history, past social history, past surgical history and problem list. Problem list updated.  Objective:   Vitals:   02/24/18 1536  BP: (!) 102/59  Pulse: 90  Weight: 150 lb 3.2 oz (68.1 kg)    Fetal Status: Fetal Heart Rate (bpm):  145 Fundal Height: 32 cm Movement: Present     General:  Alert, oriented and cooperative. Patient is in no acute distress.  Skin: Skin is warm and dry. No rash noted.   Cardiovascular: Normal heart rate noted  Respiratory: Normal respiratory effort, no problems with respiration noted  Abdomen: Soft, gravid, appropriate for gestational age.  Pain/Pressure: Present     Pelvic: Cervical exam deferred        Extremities: Normal range of motion.     Mental Status: Normal mood and affect. Normal behavior. Normal judgment and thought content.   Assessment and Plan:  Pregnancy: G2P1001 at 7261w6d  Supervision of low risk pregnancy, antepartum -she is doing well, encouraged maternity support belt for backache. -hx of gestational diabetes, 2hr gtt normal this pregnancy - discussed different post-partum contraception options. Is considering LARC at this time  Preterm labor symptoms and general obstetric precautions  including but not limited to vaginal bleeding, contractions, leaking of fluid and fetal movement were reviewed in detail with the patient. Please refer to After Visit Summary for other counseling recommendations.  Return in about 2 weeks (around 03/10/2018) for Routine OB.  No future appointments.  Alexandra Hardy, Medical Student

## 2018-03-15 ENCOUNTER — Ambulatory Visit (INDEPENDENT_AMBULATORY_CARE_PROVIDER_SITE_OTHER): Payer: Self-pay | Admitting: Student

## 2018-03-15 VITALS — BP 104/57 | HR 79 | Wt 149.1 lb

## 2018-03-15 DIAGNOSIS — Z113 Encounter for screening for infections with a predominantly sexual mode of transmission: Secondary | ICD-10-CM

## 2018-03-15 DIAGNOSIS — N898 Other specified noninflammatory disorders of vagina: Secondary | ICD-10-CM

## 2018-03-15 DIAGNOSIS — Z789 Other specified health status: Secondary | ICD-10-CM

## 2018-03-15 DIAGNOSIS — Z603 Acculturation difficulty: Secondary | ICD-10-CM

## 2018-03-15 DIAGNOSIS — O0991 Supervision of high risk pregnancy, unspecified, first trimester: Secondary | ICD-10-CM

## 2018-03-15 NOTE — Progress Notes (Signed)
Interpreter Erika  

## 2018-03-15 NOTE — Patient Instructions (Signed)
Tercer trimestre de embarazo (Third Trimester of Pregnancy) El tercer trimestre comprende desde la semana29 hasta la semana42, es decir, desde el mes7 hasta el mes9. El tercer trimestre es un perodo en el que el feto crece rpidamente. Hacia el final del noveno mes, el feto mide alrededor de 20pulgadas (45cm) de largo y pesa entre 6 y 10 libras (2,700 y 4,500kg). CAMBIOS EN EL ORGANISMO Su organismo atraviesa por muchos cambios durante el embarazo, y estos varan de una mujer a otra.  Seguir aumentando de peso. Es de esperar que aumente entre 25 y 35libras (11 y 16kg) hacia el final del embarazo.  Podrn aparecer las primeras estras en las caderas, el abdomen y las mamas.  Puede tener necesidad de orinar con ms frecuencia porque el feto baja hacia la pelvis y ejerce presin sobre la vejiga.  Debido al embarazo podr sentir acidez estomacal con frecuencia.  Puede estar estreida, ya que ciertas hormonas enlentecen los movimientos de los msculos que empujan los desechos a travs de los intestinos.  Pueden aparecer hemorroides o abultarse e hincharse las venas (venas varicosas).  Puede sentir dolor plvico debido al aumento de peso y a que las hormonas del embarazo relajan las articulaciones entre los huesos de la pelvis. El dolor de espalda puede ser consecuencia de la sobrecarga de los msculos que soportan la postura.  Tal vez haya cambios en el cabello que pueden incluir su engrosamiento, crecimiento rpido y cambios en la textura. Adems, a algunas mujeres se les cae el cabello durante o despus del embarazo, o tienen el cabello seco o fino. Lo ms probable es que el cabello se le normalice despus del nacimiento del beb.  Las mamas seguirn creciendo y le dolern. A veces, puede haber una secrecin amarilla de las mamas llamada calostro.  El ombligo puede salir hacia afuera.  Puede sentir que le falta el aire debido a que se expande el tero.  Puede notar que el feto  "baja" o lo siente ms bajo, en el abdomen.  Puede tener una prdida de secrecin mucosa con sangre. Esto suele ocurrir en el trmino de unos pocos das a una semana antes de que comience el trabajo de parto.  El cuello del tero se vuelve delgado y blando (se borra) cerca de la fecha de parto. QU DEBE ESPERAR EN LOS EXMENES PRENATALES Le harn exmenes prenatales cada 2semanas hasta la semana36. A partir de ese momento le harn exmenes semanales. Durante una visita prenatal de rutina:  La pesarn para asegurarse de que usted y el feto estn creciendo normalmente.  Le tomarn la presin arterial.  Le medirn el abdomen para controlar el desarrollo del beb.  Se escucharn los latidos cardacos fetales.  Se evaluarn los resultados de los estudios solicitados en visitas anteriores.  Le revisarn el cuello del tero cuando est prxima la fecha de parto para controlar si este se ha borrado. Alrededor de la semana36, el mdico le revisar el cuello del tero. Al mismo tiempo, realizar un anlisis de las secreciones del tejido vaginal. Este examen es para determinar si hay un tipo de bacteria, estreptococo Grupo B. El mdico le explicar esto con ms detalle. El mdico puede preguntarle lo siguiente:  Cmo le gustara que fuera el parto.  Cmo se siente.  Si siente los movimientos del beb.  Si ha tenido sntomas anormales, como prdida de lquido, sangrado, dolores de cabeza intensos o clicos abdominales.  Si est consumiendo algn producto que contenga tabaco, como cigarrillos, tabaco de mascar y   cigarrillos electrnicos.  Si tiene alguna pregunta. Otros exmenes o estudios de deteccin que pueden realizarse durante el tercer trimestre incluyen lo siguiente:  Anlisis de sangre para controlar los niveles de hierro (anemia).  Controles fetales para determinar su salud, nivel de actividad y crecimiento. Si tiene alguna enfermedad o hay problemas durante el embarazo, le harn  estudios.  Prueba del VIH (virus de inmunodeficiencia humana). Si corre un riesgo alto, pueden realizarle una prueba de deteccin del VIH durante el tercer trimestre del embarazo. FALSO TRABAJO DE PARTO Es posible que sienta contracciones leves e irregulares que finalmente desaparecen. Se llaman contracciones de Braxton Hicks o falso trabajo de parto. Las contracciones pueden durar horas, das o incluso semanas, antes de que el verdadero trabajo de parto se inicie. Si las contracciones ocurren a intervalos regulares, se intensifican o se hacen dolorosas, lo mejor es que la revise el mdico. SIGNOS DE TRABAJO DE PARTO  Clicos de tipo menstrual.  Contracciones cada 5minutos o menos.  Contracciones que comienzan en la parte superior del tero y se extienden hacia abajo, a la zona inferior del abdomen y la espalda.  Sensacin de mayor presin en la pelvis o dolor de espalda.  Una secrecin de mucosidad acuosa o con sangre que sale de la vagina. Si tiene alguno de estos signos antes de la semana37 del embarazo, llame a su mdico de inmediato. Debe concurrir al hospital para que la controlen inmediatamente. INSTRUCCIONES PARA EL CUIDADO EN EL HOGAR  Evite fumar, consumir hierbas, beber alcohol y tomar frmacos que no le hayan recetado. Estas sustancias qumicas afectan la formacin y el desarrollo del beb.  No consuma ningn producto que contenga tabaco, lo que incluye cigarrillos, tabaco de mascar y cigarrillos electrnicos. Si necesita ayuda para dejar de fumar, consulte al mdico. Puede recibir asesoramiento y otro tipo de recursos para dejar de fumar.  Siga las indicaciones del mdico en relacin con el uso de medicamentos. Durante el embarazo, hay medicamentos que son seguros de tomar y otros que no.  Haga ejercicio solamente como se lo haya indicado el mdico. Sentir clicos uterinos es un buen signo para detener la actividad fsica.  Contine comiendo alimentos sanos con  regularidad.  Use un sostn que le brinde buen soporte si le duelen las mamas.  No se d baos de inmersin en agua caliente, baos turcos ni saunas.  Use el cinturn de seguridad en todo momento mientras conduce.  No coma carne cruda ni queso sin cocinar; evite el contacto con las bandejas sanitarias de los gatos y la tierra que estos animales usan. Estos elementos contienen grmenes que pueden causar defectos congnitos en el beb.  Tome las vitaminas prenatales.  Tome entre 1500 y 2000mg de calcio diariamente comenzando en la semana20 del embarazo hasta el parto.  Si est estreida, pruebe un laxante suave (si el mdico lo autoriza). Consuma ms alimentos ricos en fibra, como vegetales y frutas frescos y cereales integrales. Beba gran cantidad de lquido para mantener la orina de tono claro o color amarillo plido.  Dese baos de asiento con agua tibia para aliviar el dolor o las molestias causadas por las hemorroides. Use una crema para las hemorroides si el mdico la autoriza.  Si tiene venas varicosas, use medias de descanso. Eleve los pies durante 15minutos, 3 o 4veces por da. Limite el consumo de sal en su dieta.  Evite levantar objetos pesados, use zapatos de tacones bajos y mantenga una buena postura.  Descanse con las piernas elevadas si tiene   calambres o dolor de cintura.  Visite a su dentista si no lo ha hecho durante el embarazo. Use un cepillo de dientes blando para higienizarse los dientes y psese el hilo dental con suavidad.  Puede seguir manteniendo relaciones sexuales, a menos que el mdico le indique lo contrario.  No haga viajes largos excepto que sea absolutamente necesario y solo con la autorizacin del mdico.  Tome clases prenatales para entender, practicar y hacer preguntas sobre el trabajo de parto y el parto.  Haga un ensayo de la partida al hospital.  Prepare el bolso que llevar al hospital.  Prepare la habitacin del beb.  Concurra a todas  las visitas prenatales segn las indicaciones de su mdico.  SOLICITE ATENCIN MDICA SI:  No est segura de que est en trabajo de parto o de que ha roto la bolsa de las aguas.  Tiene mareos.  Siente clicos leves, presin en la pelvis o dolor persistente en el abdomen.  Tiene nuseas, vmitos o diarrea persistentes.  Observa una secrecin vaginal con mal olor.  Siente dolor al orinar.  SOLICITE ATENCIN MDICA DE INMEDIATO SI:  Tiene fiebre.  Tiene una prdida de lquido por la vagina.  Tiene sangrado o pequeas prdidas vaginales.  Siente dolor intenso o clicos en el abdomen.  Sube o baja de peso rpidamente.  Tiene dificultad para respirar y siente dolor de pecho.  Sbitamente se le hinchan mucho el rostro, las manos, los tobillos, los pies o las piernas.  No ha sentido los movimientos del beb durante una hora.  Siente un dolor de cabeza intenso que no se alivia con medicamentos.  Su visin se modifica.  Esta informacin no tiene como fin reemplazar el consejo del mdico. Asegrese de hacerle al mdico cualquier pregunta que tenga. Document Released: 08/18/2005 Document Revised: 11/29/2014 Document Reviewed: 01/09/2013 Elsevier Interactive Patient Education  2017 Elsevier Inc.  

## 2018-03-15 NOTE — Progress Notes (Signed)
   PRENATAL VISIT NOTE  Subjective:  Alexandra Hardy is a 27 y.o. G2P1001 at 7611w4d being seen today for ongoing prenatal care.  She is currently monitored for the following issues for this low-risk pregnancy and has Language barrier affecting health care; Pap smear of cervix shows high risk HPV present; Supervision of high risk pregnancy, antepartum, first trimester; and History of gestational diabetes in prior pregnancy, currently pregnant on their problem list.  Patient reports vaginal discharge. Vaginal discharge has increased over the last month. Described thick yellow/white discharge. Occasional irritation when there's a lot of discharge.  Contractions: Irritability. Vag. Bleeding: None.  Movement: Present. Denies leaking of fluid.   The following portions of the patient's history were reviewed and updated as appropriate: allergies, current medications, past family history, past medical history, past social history, past surgical history and problem list. Problem list updated.  Objective:   Vitals:   03/15/18 1608  BP: (!) 104/57  Pulse: 79  Weight: 149 lb 1.6 oz (67.6 kg)    Fetal Status: Fetal Heart Rate (bpm): 140 Fundal Height: 35 cm Movement: Present     General:  Alert, oriented and cooperative. Patient is in no acute distress.  Skin: Skin is warm and dry. No rash noted.   Cardiovascular: Normal heart rate noted  Respiratory: Normal respiratory effort, no problems with respiration noted  Abdomen: Soft, gravid, appropriate for gestational age.  Pain/Pressure: Present     Pelvic: Cervical exam deferred        SSE performed, moderate amount of thick white discharge. Wet prep collected  Extremities: Normal range of motion.  Edema: Trace  Mental Status: Normal mood and affect. Normal behavior. Normal judgment and thought content.   Assessment and Plan:  Pregnancy: G2P1001 at 5811w4d  1. Supervision of high risk pregnancy, antepartum, first trimester   2. Vaginal  discharge  - Cervicovaginal ancillary only  3. Language barrier affecting health care -Spanish interpreter at bedside  Preterm labor symptoms and general obstetric precautions including but not limited to vaginal bleeding, contractions, leaking of fluid and fetal movement were reviewed in detail with the patient. Please refer to After Visit Summary for other counseling recommendations.  Return in about 2 weeks (around 03/29/2018) for Routine OB.  Future Appointments  Date Time Provider Department Center  03/29/2018  4:15 PM Kathlene [facility]oteWenzel, [name] N[facility]  04/12/2018  4:15 PM Judeth HornLawrence, Caylor Cerino, [facility]  04/19/2018  4:15 PM Armando ReichertHogan, Heather D, CNM [facility]    Judeth HornErin Hall Birchard, NP

## 2018-03-16 ENCOUNTER — Telehealth: Payer: Self-pay | Admitting: General Practice

## 2018-03-16 ENCOUNTER — Telehealth: Payer: Self-pay | Admitting: *Deleted

## 2018-03-16 LAB — CERVICOVAGINAL ANCILLARY ONLY
BACTERIAL VAGINITIS: NEGATIVE
CANDIDA VAGINITIS: NEGATIVE
Chlamydia: NEGATIVE
Neisseria Gonorrhea: NEGATIVE
TRICH (WINDOWPATH): NEGATIVE

## 2018-03-16 NOTE — Telephone Encounter (Signed)
Called patient with pacific interpreter [phone] & informed her of normal wet prep results. Patient verbalized understanding & had no questions

## 2018-03-16 NOTE — Telephone Encounter (Signed)
We do not need the HPV for this patient. It may have been clicked by mistake. She just needs the normal wet prep stuff given her complaint. We have a current pap.   Thanks,  Alexandra FanningJulie

## 2018-03-16 NOTE — Telephone Encounter (Signed)
Received a call from cytology they received an order for hpv test - they cannot do this as they only received a swab. hpv must be done on pap .will forward to provider.

## 2018-03-29 ENCOUNTER — Encounter: Payer: Self-pay | Admitting: Medical

## 2018-03-29 ENCOUNTER — Ambulatory Visit (INDEPENDENT_AMBULATORY_CARE_PROVIDER_SITE_OTHER): Payer: Self-pay | Admitting: Medical

## 2018-03-29 VITALS — BP 106/57 | HR 79 | Wt 149.8 lb

## 2018-03-29 DIAGNOSIS — O0990 Supervision of high risk pregnancy, unspecified, unspecified trimester: Secondary | ICD-10-CM

## 2018-03-29 LAB — OB RESULTS CONSOLE GBS: GBS: NEGATIVE

## 2018-03-29 NOTE — Patient Instructions (Signed)

## 2018-03-29 NOTE — Progress Notes (Signed)
   PRENATAL VISIT NOTE  Subjective:  Alexandra Hardy is a 27 y.o. G2P1001 at [redacted]w[redacted]d being seen today for ongoing prenatal care.  She is currently monitored for the following issues for this high-risk pregnancy and has Language barrier affecting health care; Pap smear of cervix shows high risk HPV present; Supervision of high risk pregnancy, antepartum, first trimester; and History of gestational diabetes in prior pregnancy, currently pregnant on their problem list.  Patient reports no complaints.  Contractions: Irritability. Vag. Bleeding: None.  Movement: Present. Denies leaking of fluid.   The following portions of the patient's history were reviewed and updated as appropriate: allergies, current medications, past family history, past medical history, past social history, past surgical history and problem list. Problem list updated.  Objective:   Vitals:   03/29/18 1433  BP: (!) 106/57  Pulse: 79  Weight: 149 lb 12.8 oz (67.9 kg)    Fetal Status: Fetal Heart Rate (bpm): 145 Fundal Height: 36 cm Movement: Present  Presentation: Vertex  General:  Alert, oriented and cooperative. Patient is in no acute distress.  Skin: Skin is warm and dry. No rash noted.   Cardiovascular: Normal heart rate noted  Respiratory: Normal respiratory effort, no problems with respiration noted  Abdomen: Soft, gravid, appropriate for gestational age.  Pain/Pressure: Present     Pelvic: Cervical exam performed Dilation: 2 Effacement (%): 50 Station: -2  Extremities: Normal range of motion.  Edema: Trace  Mental Status: Normal mood and affect. Normal behavior. Normal judgment and thought content.   Assessment and Plan:  Pregnancy: G2P1001 at [redacted]w[redacted]d  1. Supervision of high risk pregnancy, antepartum - GBS today  - GC/Chlamydia not done, was completed and normal on 4/24  Preterm labor symptoms and general obstetric precautions including but not limited to vaginal bleeding, contractions, leaking of fluid  and fetal movement were reviewed in detail with the patient. Please refer to After Visit Summary for other counseling recommendations.  Return in about 1 week (around 04/05/2018) for LOB.  Future Appointments  Date Time Provider Department Center  04/12/2018  4:15 PM Judeth Horn, NP [facility]  04/19/2018  4:15 PM Armando Reichert, CNM [facility]    Vonzella Nipple, PA-C

## 2018-03-29 NOTE — Progress Notes (Signed)
[employer] interpreter Vicente Serene [phone]

## 2018-04-02 LAB — CULTURE, BETA STREP (GROUP B ONLY): Strep Gp B Culture: NEGATIVE

## 2018-04-12 ENCOUNTER — Inpatient Hospital Stay (HOSPITAL_COMMUNITY): Payer: Medicaid Other | Admitting: Anesthesiology

## 2018-04-12 ENCOUNTER — Inpatient Hospital Stay (HOSPITAL_COMMUNITY)
Admission: AD | Admit: 2018-04-12 | Discharge: 2018-04-14 | DRG: 807 | Disposition: A | Discharge status: Home / Self Care | Payer: Medicaid Other | Source: Ambulatory Visit | Attending: Obstetrics and Gynecology | Admitting: Obstetrics and Gynecology

## 2018-04-12 ENCOUNTER — Encounter (HOSPITAL_COMMUNITY): Payer: Self-pay | Admitting: *Deleted

## 2018-04-12 ENCOUNTER — Encounter: Payer: Self-pay | Admitting: Student

## 2018-04-12 DIAGNOSIS — O43123 Velamentous insertion of umbilical cord, third trimester: Principal | ICD-10-CM | POA: Diagnosis present

## 2018-04-12 DIAGNOSIS — Z3A38 38 weeks gestation of pregnancy: Secondary | ICD-10-CM | POA: Diagnosis not present

## 2018-04-12 DIAGNOSIS — O9089 Other complications of the puerperium, not elsewhere classified: Secondary | ICD-10-CM | POA: Diagnosis not present

## 2018-04-12 DIAGNOSIS — Z8632 Personal history of gestational diabetes: Secondary | ICD-10-CM

## 2018-04-12 DIAGNOSIS — K5900 Constipation, unspecified: Secondary | ICD-10-CM | POA: Diagnosis not present

## 2018-04-12 DIAGNOSIS — Z3483 Encounter for supervision of other normal pregnancy, third trimester: Secondary | ICD-10-CM | POA: Diagnosis present

## 2018-04-12 DIAGNOSIS — O09299 Supervision of pregnancy with other poor reproductive or obstetric history, unspecified trimester: Secondary | ICD-10-CM

## 2018-04-12 LAB — CBC
HCT: 38.3 % (ref 36.0–46.0)
HEMOGLOBIN: 13.3 g/dL (ref 12.0–15.0)
MCH: 33.2 pg (ref 26.0–34.0)
MCHC: 34.7 g/dL (ref 30.0–36.0)
MCV: 95.5 fL (ref 78.0–100.0)
PLATELETS: 141 10*3/uL — AB (ref 150–400)
RBC: 4.01 MIL/uL (ref 3.87–5.11)
RDW: 13.8 % (ref 11.5–15.5)
WBC: 5.6 10*3/uL (ref 4.0–10.5)

## 2018-04-12 LAB — RPR: RPR Ser Ql: NONREACTIVE

## 2018-04-12 LAB — TYPE AND SCREEN
ABO/RH(D): O POS
Antibody Screen: NEGATIVE

## 2018-04-12 LAB — POCT FERN TEST: POCT Fern Test: POSITIVE

## 2018-04-12 MED ORDER — ONDANSETRON HCL 4 MG/2ML IJ SOLN: 4.0000 mg | INTRAMUSCULAR | Status: DC | PRN

## 2018-04-12 MED ORDER — DIPHENHYDRAMINE HCL 25 MG PO CAPS: 25.0000 mg | ORAL_CAPSULE | Freq: Four times a day (QID) | ORAL | Status: DC | PRN

## 2018-04-12 MED ORDER — PHENYLEPHRINE 40 MCG/ML (10ML) SYRINGE FOR IV PUSH (FOR BLOOD PRESSURE SUPPORT)
80.0000 ug | PREFILLED_SYRINGE | INTRAVENOUS | Status: DC | PRN
  Filled 2018-04-12: qty 5

## 2018-04-12 MED ORDER — LACTATED RINGERS IV SOLN
INTRAVENOUS | Status: DC
  Administered 2018-04-12: 04:00:00 via INTRAVENOUS

## 2018-04-12 MED ORDER — EPHEDRINE 5 MG/ML INJ
10.0000 mg | INTRAVENOUS | Status: DC | PRN
  Filled 2018-04-12: qty 2

## 2018-04-12 MED ORDER — LACTATED RINGERS IV SOLN: 500.0000 mL | Freq: Once | INTRAVENOUS | Status: DC

## 2018-04-12 MED ORDER — LIDOCAINE HCL (PF) 1 % IJ SOLN
INTRAMUSCULAR | Status: DC | PRN
  Administered 2018-04-12: 5 mL via EPIDURAL
  Administered 2018-04-12: 2 mL via EPIDURAL
  Administered 2018-04-12: 3 mL via EPIDURAL

## 2018-04-12 MED ORDER — LIDOCAINE HCL (PF) 1 % IJ SOLN
30.0000 mL | INTRAMUSCULAR | Status: DC | PRN
  Filled 2018-04-12: qty 30

## 2018-04-12 MED ORDER — SIMETHICONE 80 MG PO CHEW: 80.0000 mg | CHEWABLE_TABLET | ORAL | Status: DC | PRN

## 2018-04-12 MED ORDER — ACETAMINOPHEN 325 MG PO TABS
650.0000 mg | ORAL_TABLET | ORAL | Status: DC | PRN
  Administered 2018-04-13 (×2): 650 mg via ORAL
  Filled 2018-04-12 (×2): qty 2

## 2018-04-12 MED ORDER — OXYCODONE-ACETAMINOPHEN 5-325 MG PO TABS: 1.0000 | ORAL_TABLET | ORAL | Status: DC | PRN

## 2018-04-12 MED ORDER — OXYTOCIN BOLUS FROM INFUSION
500.0000 mL | Freq: Once | INTRAVENOUS | Status: AC
  Administered 2018-04-12: 500 mL via INTRAVENOUS

## 2018-04-12 MED ORDER — ACETAMINOPHEN 325 MG PO TABS: 650.0000 mg | ORAL_TABLET | ORAL | Status: DC | PRN

## 2018-04-12 MED ORDER — FENTANYL 2.5 MCG/ML BUPIVACAINE 1/10 % EPIDURAL INFUSION (WH - ANES)
INTRAMUSCULAR | Status: AC
  Filled 2018-04-12: qty 100

## 2018-04-12 MED ORDER — BENZOCAINE-MENTHOL 20-0.5 % EX AERO
1.0000 "application " | INHALATION_SPRAY | CUTANEOUS | Status: DC | PRN
  Administered 2018-04-12: 1 via TOPICAL
  Filled 2018-04-12: qty 56

## 2018-04-12 MED ORDER — LACTATED RINGERS IV SOLN: 500.0000 mL | INTRAVENOUS | Status: DC | PRN

## 2018-04-12 MED ORDER — SENNOSIDES-DOCUSATE SODIUM 8.6-50 MG PO TABS
2.0000 | ORAL_TABLET | ORAL | Status: DC
  Administered 2018-04-13 – 2018-04-14 (×2): 2 via ORAL
  Filled 2018-04-12 (×2): qty 2

## 2018-04-12 MED ORDER — PRENATAL MULTIVITAMIN CH
1.0000 | ORAL_TABLET | Freq: Every day | ORAL | Status: DC
  Administered 2018-04-12 – 2018-04-14 (×3): 1 via ORAL
  Filled 2018-04-12 (×3): qty 1

## 2018-04-12 MED ORDER — WITCH HAZEL-GLYCERIN EX PADS: 1.0000 "application " | MEDICATED_PAD | CUTANEOUS | Status: DC | PRN

## 2018-04-12 MED ORDER — SOD CITRATE-CITRIC ACID 500-334 MG/5ML PO SOLN: 30.0000 mL | ORAL | Status: DC | PRN

## 2018-04-12 MED ORDER — ONDANSETRON HCL 4 MG/2ML IJ SOLN: 4.0000 mg | Freq: Four times a day (QID) | INTRAMUSCULAR | Status: DC | PRN

## 2018-04-12 MED ORDER — TETANUS-DIPHTH-ACELL PERTUSSIS 5-2.5-18.5 LF-MCG/0.5 IM SUSP: 0.5000 mL | Freq: Once | INTRAMUSCULAR | Status: DC

## 2018-04-12 MED ORDER — PHENYLEPHRINE 40 MCG/ML (10ML) SYRINGE FOR IV PUSH (FOR BLOOD PRESSURE SUPPORT)
PREFILLED_SYRINGE | INTRAVENOUS | Status: AC
  Filled 2018-04-12: qty 20

## 2018-04-12 MED ORDER — LACTATED RINGERS IV SOLN
500.0000 mL | Freq: Once | INTRAVENOUS | Status: AC
  Administered 2018-04-12: 500 mL via INTRAVENOUS

## 2018-04-12 MED ORDER — DIBUCAINE 1 % RE OINT: 1.0000 "application " | TOPICAL_OINTMENT | RECTAL | Status: DC | PRN

## 2018-04-12 MED ORDER — IBUPROFEN 600 MG PO TABS
600.0000 mg | ORAL_TABLET | Freq: Four times a day (QID) | ORAL | Status: DC
  Administered 2018-04-12 – 2018-04-14 (×9): 600 mg via ORAL
  Filled 2018-04-12 (×9): qty 1

## 2018-04-12 MED ORDER — ONDANSETRON HCL 4 MG PO TABS: 4.0000 mg | ORAL_TABLET | ORAL | Status: DC | PRN

## 2018-04-12 MED ORDER — OXYTOCIN 40 UNITS IN LACTATED RINGERS INFUSION - SIMPLE MED
2.5000 [IU]/h | INTRAVENOUS | Status: DC
  Administered 2018-04-12: 2.5 [IU]/h via INTRAVENOUS
  Filled 2018-04-12: qty 1000

## 2018-04-12 MED ORDER — FENTANYL CITRATE (PF) 100 MCG/2ML IJ SOLN
100.0000 ug | INTRAMUSCULAR | Status: DC | PRN
  Administered 2018-04-12: 100 ug via INTRAVENOUS
  Filled 2018-04-12: qty 2

## 2018-04-12 MED ORDER — DIPHENHYDRAMINE HCL 50 MG/ML IJ SOLN: 12.5000 mg | INTRAMUSCULAR | Status: DC | PRN

## 2018-04-12 MED ORDER — COCONUT OIL OIL
1.0000 "application " | TOPICAL_OIL | Status: DC | PRN
  Administered 2018-04-13: 1 via TOPICAL
  Filled 2018-04-12: qty 120

## 2018-04-12 MED ORDER — OXYCODONE-ACETAMINOPHEN 5-325 MG PO TABS: 2.0000 | ORAL_TABLET | ORAL | Status: DC | PRN

## 2018-04-12 MED ORDER — FENTANYL 2.5 MCG/ML BUPIVACAINE 1/10 % EPIDURAL INFUSION (WH - ANES)
14.0000 mL/h | INTRAMUSCULAR | Status: DC | PRN
  Administered 2018-04-12: 12 mL/h via EPIDURAL

## 2018-04-12 MED ORDER — ZOLPIDEM TARTRATE 5 MG PO TABS: 5.0000 mg | ORAL_TABLET | Freq: Every evening | ORAL | Status: DC | PRN

## 2018-04-12 NOTE — Anesthesia Postprocedure Evaluation (Signed)
Anesthesia Post Note  Patient: Alexandra Hardy  Procedure(s) Performed: AN AD HOC LABOR EPIDURAL     Patient location during evaluation: Mother Baby Anesthesia Type: Epidural Level of consciousness: awake and alert and oriented Pain management: satisfactory to patient Vital Signs Assessment: post-procedure vital signs reviewed and stable Respiratory status: spontaneous breathing and nonlabored ventilation Cardiovascular status: stable Postop Assessment: no headache, no backache, no signs of nausea or vomiting, adequate PO intake and patient able to bend at knees (patient up walking) Anesthetic complications: no    Last Vitals:  Vitals:   04/12/18 0906 04/12/18 1000  BP: 122/74 (!) 90/57  Pulse: 79 80  Resp: 16 18  Temp: 36.7 C 37.2 C    Last Pain:  Vitals:   04/12/18 1246  TempSrc:   PainSc: 5    Pain Goal:                 Madison Hickman

## 2018-04-12 NOTE — Anesthesia Procedure Notes (Signed)
Epidural Patient location during procedure: OB Start time: 04/12/2018 5:39 AM End time: 04/12/2018 5:44 AM  Staffing Anesthesiologist: Cecile Hearing, MD Performed: anesthesiologist   Preanesthetic Checklist Completed: patient identified, pre-op evaluation, timeout performed, IV checked, risks and benefits discussed and monitors and equipment checked  Epidural Patient position: sitting Prep: DuraPrep Patient monitoring: blood pressure and continuous pulse ox Approach: midline Location: L3-L4 Injection technique: LOR air  Needle:  Needle type: Tuohy  Needle gauge: 17 G Needle length: 9 cm Needle insertion depth: 5 cm Catheter size: 19 Gauge Catheter at skin depth: 10 cm Test dose: negative and Other (1% Lidocaine)  Additional Notes Patient identified.  Risk benefits discussed including failed block, incomplete pain control, headache, nerve damage, paralysis, blood pressure changes, nausea, vomiting, reactions to medication both toxic or allergic, and postpartum back pain.  Patient expressed understanding and wished to proceed.  All questions were answered.  Sterile technique used throughout procedure and epidural site dressed with sterile barrier dressing. No paresthesia or other complications noted. The patient did not experience any signs of intravascular injection such as tinnitus or metallic taste in mouth nor signs of intrathecal spread such as rapid motor block. Please see nursing notes for vital signs. Reason for block:procedure for pain

## 2018-04-12 NOTE — MAU Note (Signed)
Pt reports SROM @ 0215

## 2018-04-12 NOTE — H&P (Addendum)
LABOR AND DELIVERY ADMISSION HISTORY AND PHYSICAL NOTE  Alexandra Hardy is a 27 y.o. female G2P1001 with IUP at [redacted]w[redacted]d by LMP presenting for SROM at 2:50 AM.  She reports positive fetal movement. She denies leakage of fluid or vaginal bleeding.  Prenatal History/Complications: PNC at [facility] Pregnancy complications:  - Language barrier affecting health care; Pap smear of cervix shows high risk HPV present; Supervision of high risk pregnancy, antepartum, first trimester; and History of gestational diabetes in prior pregnancy  Past Medical History: Past Medical History:  Diagnosis Date  . Gestational diabetes   . History of HPV infection     Past Surgical History: Past Surgical History:  Procedure Laterality Date  . NO PAST SURGERIES      Obstetrical History: OB History    Gravida  2   Para  1   Term  1   Preterm      AB      Living  1     SAB      TAB      Ectopic      Multiple  0   Live Births  1           Social History: Social History   Socioeconomic History  . Marital status: Single    Spouse name: Not on file  . Number of children: Not on file  . Years of education: Not on file  . Highest education level: Not on file  Occupational History  . Not on file  Social Needs  . Financial resource strain: Not on file  . Food insecurity:    Worry: Not on file    Inability: Not on file  . Transportation needs:    Medical: Not on file    Non-medical: Not on file  Tobacco Use  . Smoking status: Never Smoker  . Smokeless tobacco: Never Used  Substance and Sexual Activity  . Alcohol use: No  . Drug use: No  . Sexual activity: Not Currently    Birth control/protection: Condom  Lifestyle  . Physical activity:    Days per week: Not on file    Minutes per session: Not on file  . Stress: Not on file  Relationships  . Social connections:    Talks on phone: Not on file    Gets together: Not on file    Attends religious service: Not on file   Active member of club or organization: Not on file    Attends meetings of clubs or organizations: Not on file    Relationship status: Not on file  Other Topics Concern  . Not on file  Social History Narrative  . Not on file    Family History: Family History  Problem Relation Age of Onset  . Diabetes Father     Allergies: No Known Allergies  Medications Prior to Admission  Medication Sig Dispense Refill Last Dose  . acetaminophen (TYLENOL) 325 MG tablet Take 650 mg by mouth every 6 (six) hours as needed for moderate pain or headache.   Past Month at Unknown time  . cyclobenzaprine (FLEXERIL) 5 MG tablet Take 1 tablet (5 mg total) by mouth 3 (three) times daily as needed for muscle spasms. 30 tablet 2 Past Week at Unknown time  . Prenatal Vit-Fe Fumarate-FA (PRENATAL VITAMIN PO) Take 1 tablet by mouth daily.    Past Week at Unknown time     Review of Systems  All systems reviewed and negative except as stated in HPI  Physical Exam Blood pressure (!) 116/56, pulse 74, temperature 98.3 F (36.8 C), temperature source Axillary, resp. rate 18, height  (1.575 m), weight 70.8 kg (156 lb), last menstrual period 06/27/2017, unknown if currently breastfeeding. General appearance: alert, oriented, NAD Lungs: normal respiratory effort Heart: regular rate Abdomen: soft, non-tender; gravid, FH appropriate for GA Extremities: No calf swelling or tenderness Presentation: cephalic Fetal monitoring: 135, acc +, dec absent Uterine activity: irregular, 2-38m Dilation: 7 Effacement (%): 80 Station: -1 Exam by:: k hutchison, rn  Prenatal labs: ABO, Rh: O/Positive/-- (11/14 1128) Antibody: Negative (11/14 1128) Rubella: 4.31 (11/14 1128) RPR: Non Reactive (03/21 1024)  HBsAg: Negative (11/14 1128)  HIV: Non Reactive (03/21 1024)  GC/Chlamydia: negative GBS: Negative (05/08 0000)  2-hr GTT: 136 Genetic screening:  normal Anatomy US: normal  Prenatal Transfer Tool  Maternal  Diabetes: No Genetic Screening: Normal Maternal Ultrasounds/Referrals: Normal Fetal Ultrasounds or other Referrals:  None Maternal Substance Abuse:  No Significant Maternal Medications:  None Significant Maternal Lab Results: None  Results for orders placed or performed during the hospital encounter of 04/12/18 (from the past 24 hour(s))  POCT fern test   Collection Time: 04/12/18  3:40 AM  Result Value Ref Range   POCT Fern Test Positive = ruptured amniotic membanes     Patient Active Problem List   Diagnosis Date Noted  . Normal labor 04/12/2018  . History of gestational diabetes in prior pregnancy, currently pregnant 11/02/2017  . Supervision of high risk pregnancy, antepartum, first trimester 10/05/2017  . Pap smear of cervix shows high risk HPV present 05/14/2015  . Language barrier affecting health care 03/12/2015    Assessment: Alexandra Hardy is a 27 y.o. G2P1001 at [redacted]w[redacted]d here for active labor with SROM at 02:50 AM  #Labor: Active, progressing normally. Expectant management #Pain: Will get Epidural #FWB: Category I #ID:  GBS negative #MOF: breast #MOC:Nexplanon #Circ:  no  Felisa Bonier, MD, PGY-1 Family Medicine - [facility] [place] 04/12/2018, 5:12 AM   I spoke with and examined patient and agree with resident/PA/SNM's note and plan of care.  Cheral Marker, CNM, [facility] 04/13/2018 9:35 AM

## 2018-04-12 NOTE — Anesthesia Preprocedure Evaluation (Signed)
Anesthesia Evaluation  Patient identified by MRN, date of birth, ID band Patient awake    Reviewed: Allergy & Precautions, NPO status , Patient's Chart, lab work & pertinent test results  Airway Mallampati: II  TM Distance: >3 FB Neck ROM: Full    Dental  (+) Teeth Intact, Dental Advisory Given   Pulmonary neg pulmonary ROS,    Pulmonary exam normal breath sounds clear to auscultation       Cardiovascular negative cardio ROS Normal cardiovascular exam Rhythm:Regular Rate:Normal     Neuro/Psych negative neurological ROS     GI/Hepatic negative GI ROS, Neg liver ROS,   Endo/Other  diabetes, Gestational  Renal/GU negative Renal ROS     Musculoskeletal negative musculoskeletal ROS (+)   Abdominal   Peds  Hematology  (+) Blood dyscrasia (Thrombocytopenia: Plt 141k), ,   Anesthesia Other Findings Day of surgery medications reviewed with the patient.  Reproductive/Obstetrics (+) Pregnancy                             Anesthesia Physical Anesthesia Plan  ASA: II  Anesthesia Plan: Epidural   Post-op Pain Management:    Induction:   PONV Risk Score and Plan: 2 and Treatment may vary due to age or medical condition  Airway Management Planned:   Additional Equipment:   Intra-op Plan:   Post-operative Plan:   Informed Consent: I have reviewed the patients History and Physical, chart, labs and discussed the procedure including the risks, benefits and alternatives for the proposed anesthesia with the patient or authorized representative who has indicated his/her understanding and acceptance.   Dental advisory given  Plan Discussed with:   Anesthesia Plan Comments: (Patient identified. Risks/Benefits/Options discussed with patient including but not limited to bleeding, infection, nerve damage, paralysis, failed block, incomplete pain control, headache, blood pressure changes, nausea,  vomiting, reactions to medication both or allergic, itching and postpartum back pain. Confirmed with bedside nurse the patient's most recent platelet count. Confirmed with patient that they are not currently taking any anticoagulation, have any bleeding history or any family history of bleeding disorders. Patient expressed understanding and wished to proceed. All questions were answered. )        Anesthesia Quick Evaluation

## 2018-04-12 NOTE — Lactation Note (Signed)
This note was copied from a baby's chart. Lactation Consultation Note  Patient Name: Alexandra Hardy [id number] Date: 04/12/2018 Reason for consult: Initial assessment;Early term 37-38.6wks;Other (Comment)(DAT (+))  15 hours old female who is being exclusively BF by her mother, she's a P2. Mom is experienced BF, she was able to BF her older child for 18 months. She did voiced that her older child had to be put in phototherapy and stayed in the hospital nearly for 9-10 days due to jaundice. Made mom aware that this baby is DAT (+) offered to set up a DEBP just in case supplementation was needed but she declined any kind of pumps, she wouldn't even take a free manual pump offered to take home; mom doesn't feel comfortable pumping at this point. She said she'll think about it and let her RN know.  Offered assistance with latch but mom stated baby already fed, baby was swaddled in visitor's arms when entering the room. Per mom feedings at the breast are comfortable and she's able to hear swallows and saw some milk coming out of the corner's of baby's mouth. Asked mom to call for latch assistance if needed.  Encouraged mom to feed baby STS 8-12 times/24 hours or sooner if feeding cues are present. Mom will let her RN know if she changes her mind about pumping. Reviewed BF brochure (SP), BF resources and feeding diary (SP) mom is aware of LC services and will call PRN.  Maternal Data Formula Feeding for Exclusion: No Has patient been taught Hand Expression?: Yes Does the patient have breastfeeding experience prior to this delivery?: Yes  Feeding Feeding Type: Breast Fed Length of feed: 20 min  LATCH Score Latch: Grasps breast easily, tongue down, lips flanged, rhythmical sucking.  Audible Swallowing: Spontaneous and intermittent  Type of Nipple: Everted at rest and after stimulation  Comfort (Breast/Nipple): Soft / non-tender  Hold (Positioning): No assistance needed to correctly  position infant at breast.  LATCH Score: 10  Interventions Interventions: Breast feeding basics reviewed  Lactation Tools Discussed/Used WIC Program: No   Consult Status Consult Status: Follow-up Date: 04/13/18 Follow-up type: In-patient    Laylani Pudwill Venetia Constable 04/12/2018, 8:26 PM

## 2018-04-13 MED ORDER — DOCUSATE SODIUM 100 MG PO CAPS
100.0000 mg | ORAL_CAPSULE | Freq: Two times a day (BID) | ORAL | Status: DC
  Administered 2018-04-13 – 2018-04-14 (×3): 100 mg via ORAL
  Filled 2018-04-13 (×3): qty 1

## 2018-04-13 NOTE — Progress Notes (Signed)
POSTPARTUM PROGRESS NOTE  Post Partum Day 1  Subjective:  Alexandra Hardy is a 27 y.o. [id number] s/p SVd at [redacted]w[redacted]d.  She reports she is doing well. No acute events overnight. She denies any problems with ambulating, voiding or po intake. Denies nausea or vomiting.  Pain well-controlled. Reports issues with constipation.  Objective: Blood pressure 96/64, pulse 90, temperature 97.9 F (36.6 C), temperature source Oral, resp. rate 19, height  (1.575 m), weight 156 lb (70.8 kg), last menstrual period 06/27/2017, SpO2 98 %, unknown if currently breastfeeding.  Physical Exam:  General: alert, cooperative and no distress Chest: no respiratory distress Heart:regular rate, distal pulses intact Abdomen: soft, nontender,  Uterine Fundus: firm, appropriately tender DVT Evaluation: No calf swelling or tenderness Extremities: no edema Skin: warm, dry  Recent Labs    04/12/18 0512  HGB 13.3  HCT 38.3    Assessment/Plan: Alexandra Hardy is a 27 y.o. [id number] s/p SVD at [redacted]w[redacted]d   PPD#1 - Doing well  Routine postpartum care  Colace BID for consitpation Contraception: Nexplanon Feeding: breast and bottle Dispo: Plan for discharge tomorrow.   LOS: 1 day   Kandra Nicolas DegeleMD 04/13/2018, 7:17 AM

## 2018-04-13 NOTE — Lactation Note (Signed)
This note was copied from a baby's chart. Lactation Consultation Note  Patient Name: Boy Maicey Barrientez [id number] Date: 04/13/2018 Reason for consult: Hyperbilirubinemia  Mom is a P2 who nursed her 1st child for 1.5 years.  Her 1st child had a prolonged in-hospital stay (about 10 days) at birth b/c of jaundice. This infant's bili levels have been rising & now on phototherapy. Mom was shown how to use DEBP & how to disassemble, clean, & reassemble parts. She was also shown how to assemble & use hand pump that was included in pump kit. For the time being, infant will be fed via bottle w/EBM & formula. Mom also has vials for hand expressing colostrum. Mom has a full supply once her milk comes to volume (she was able to exclusively breast feed her 1st child after d/c from hospital)  Hand expression was taught to Mom; about 54m obtained.   Father served as iAstronomerduring consult (consent signed by Mom earlier during her inpatient stay).   RMatthias HughsHColumbia Gastrointestinal Endoscopy Center5/23/2019, 4:04 PM

## 2018-04-14 ENCOUNTER — Other Ambulatory Visit: Payer: Self-pay

## 2018-04-14 MED ORDER — IBUPROFEN 600 MG PO TABS: 600.0000 mg | ORAL_TABLET | Freq: Four times a day (QID) | ORAL | 0 refills | Status: DC | PRN

## 2018-04-14 NOTE — Discharge Summary (Addendum)
OB Discharge Summary     Patient Name: Alexandra Hardy DOB: 1991-06-08 MRN: [id number]  Date of admission: 04/12/2018 Delivering MD: Shawna Clamp R   Date of discharge: 04/14/2018  Admitting diagnosis: 37 WEEKS ROM CTX Intrauterine pregnancy: [redacted]w[redacted]d     Secondary diagnosis:  Active Problems:   History of gestational diabetes in prior pregnancy, currently pregnant   Normal labor   SVD (spontaneous vaginal delivery)  Additional problems: none     Discharge diagnosis: Term Pregnancy Delivered                                                                                                Post partum procedures:none  Augmentation: no  Complications: None  Hospital course:  Onset of Labor With Vaginal Delivery     27 y.o. yo [id number] at [redacted]w[redacted]d was admitted in Active Labor on 04/12/2018. Patient had an uncomplicated labor course as follows:  Membrane Rupture Time/Date: 2:15 AM ,04/12/2018   Intrapartum Procedures: Episiotomy: None [1]                                         Lacerations:  None [1]  Patient had a delivery of a Viable infant. 04/12/2018  Information for the patient's newborn:  Kayliee, Skurka [[id number]]  Delivery Method: Vag-Spont    Pateint had an uncomplicated postpartum course.  She is ambulating, tolerating a regular diet, passing flatus, and urinating well. Patient is discharged home in stable condition on 04/14/18.   Physical exam  Vitals:   04/12/18 1823 04/13/18 0533 04/13/18 1823 04/14/18 0600  BP: 98/67 96/64 111/65 111/66  Pulse: 79 90 74 74  Resp: 18 19 16 16   Temp: 98.1 F (36.7 C) 97.9 F (36.6 C) 97.7 F (36.5 C) 97.8 F (36.6 C)  TempSrc: Oral Oral Oral Oral  SpO2:  98%    Weight:      Height:       General: alert, cooperative and no distress Lochia: appropriate Uterine Fundus: firm Incision: N/A DVT Evaluation: No evidence of DVT seen on physical exam. Labs: Lab Results  Component Value Date   WBC 5.6  04/12/2018   HGB 13.3 04/12/2018   HCT 38.3 04/12/2018   MCV 95.5 04/12/2018   PLT 141 (L) 04/12/2018   CMP Latest Ref Rng & Units 04/20/2017  Glucose 65 - 99 mg/dL 87  BUN 6 - 20 mg/dL 9  Creatinine 6.06 - 3.01 mg/dL 6.01  Sodium 093 - 235 mmol/L 137  Potassium 3.5 - 5.1 mmol/L 4.1  Chloride 101 - 111 mmol/L 105  CO2 22 - 32 mmol/L 25  Calcium 8.9 - 10.3 mg/dL 5.7(D)  Total Protein 6.5 - 8.1 g/dL 8.2(H)  Total Bilirubin 0.3 - 1.2 mg/dL 0.6  Alkaline Phos 38 - 126 U/L 90  AST 15 - 41 U/L 32  ALT 14 - 54 U/L 22    Discharge instruction: per After Visit Summary and "Baby and Me Booklet".  After visit meds:  Allergies as  of 04/14/2018   No Known Allergies     Medication List    TAKE these medications   acetaminophen 325 MG tablet Commonly known as:  TYLENOL Take 650 mg by mouth every 6 (six) hours as needed for moderate pain or headache.   cyclobenzaprine 5 MG tablet Commonly known as:  FLEXERIL Take 1 tablet (5 mg total) by mouth 3 (three) times daily as needed for muscle spasms.   ibuprofen 600 MG tablet Commonly known as:  ADVIL,MOTRIN Take 1 tablet (600 mg total) by mouth every 6 (six) hours as needed.   PRENATAL VITAMIN PO Take 1 tablet by mouth daily.       Diet: routine diet  Activity: Advance as tolerated. Pelvic rest for 6 weeks.   Outpatient follow up:6 weeks Follow up Appt: Future Appointments  Date Time Provider Department Center  05/15/2018  4:40 PM Armando Reichert, CNM [facility]   Follow up Visit:No follow-ups on file.  Postpartum contraception: Nexplanon  Newborn Data: Live born female  Birth Weight: 8 lb 7.1 oz (3830 g) APGAR: 9, 9  Newborn Delivery   Birth date/time:  04/12/2018 07:27:00 Delivery type:  Vaginal, Spontaneous     Baby Feeding: Bottle and Breast Disposition:home with mother   04/14/2018 Felisa Bonier, MD, PGY-1 Family Medicine - [facility] [place]  OB FELLOW DISCHARGE ATTESTATION I have seen and examined  this patient and agree with above documentation in the resident's note.   Patient doing well PPD#2 from SVD. PPV in 4 weeks.   Frederik Pear, MD OB Fellow 7:54 AM

## 2018-04-14 NOTE — Lactation Note (Signed)
This note was copied from a baby's chart. Lactation Consultation Note  Patient Name: Alexandra Hardy [id number] Date: 04/14/2018 Reason for consult: Follow-up assessment;Hyperbilirubinemia Mom reports feedings are going well.  Baby is also receiving formula supplementation.  She denies questions or concerns.  Instructed to feed with cues and call for assist prn.  Maternal Data    Feeding Feeding Type: Breast Fed Length of feed: 15 min  LATCH Score                   Interventions    Lactation Tools Discussed/Used     Consult Status Consult Status: Follow-up Date: 04/15/18 Follow-up type: In-patient    Alexandra Hardy 04/14/2018, 10:08 AM

## 2018-04-14 NOTE — Discharge Instructions (Signed)
Parto vaginal, cuidados posteriores  Vaginal Delivery, Care After  Siga estas instrucciones durante las prximas semanas. Estas indicaciones le proporcionan informacin acerca de cmo deber cuidarse despus del parto vaginal. Su mdico tambin podr darle indicaciones ms especficas. El tratamiento ha sido planificado segn las prcticas mdicas actuales, pero en algunos casos pueden ocurrir problemas. Llame al mdico si tiene problemas o preguntas.  Qu puedo esperar despus del procedimiento?  Despus de un parto vaginal, es frecuente tener lo siguiente:   Hemorragia leve de la vagina.   Dolor en el abdomen, la vagina y la zona de la piel entre la abertura vaginal y el ano (perineo).   Calambres plvicos.   Fatiga.    Siga estas indicaciones en su casa:  Medicamentos   Tome los medicamentos de venta libre y los recetados solamente como se lo haya indicado el mdico.   Si le recetaron un antibitico, tmelo como se lo haya indicado el mdico. No interrumpa la administracin del antibitico hasta que lo haya terminado.  Conducir     No conduzca ni opere maquinaria pesada mientras toma analgsicos recetados.   No conduzca durante 24horas si le administraron un sedante.  Estilo de vida   No beba alcohol. Esto es de suma importancia si est amamantando o toma analgsicos.   No consuma productos que contengan tabaco, incluidos cigarrillos, tabaco de mascar o cigarrillos electrnicos. Si necesita ayuda para dejar de fumar, consulte al mdico.  Qu debe comer y beber   Beba al menos 8vasos de ochoonzas (240cc) de agua todos los das a menos que el mdico le indique lo contrario. Si elige amamantar al beb, quiz deba beber an ms cantidad de agua.   Coma alimentos ricos en fibras todos los das. Estos alimentos pueden ayudarla a prevenir o aliviar el estreimiento. Los alimentos ricos en fibras incluyen, entre otros:  ? Panes y cereales integrales.  ? Arroz integral.  ? Frijoles.  ? Frutas y verduras  frescas.  Actividad   Retome sus actividades normales como se lo haya indicado el mdico. Pregntele al mdico qu actividades son seguras para usted.   Descanse todo lo que pueda. Trate de descansar o tomar una siesta mientras el beb est durmiendo.   No levante objetos que pesen ms que su beb o 10libras (4,5kg) hasta que el mdico le diga que es seguro.   Hable con el mdico sobre cundo puede retomar la actividad sexual. Esto puede depender de lo siguiente:  ? Riesgo de sufrir una infeccin.  ? Velocidad de cicatrizacin.  ? Comodidad y deseo de retomar la actividad sexual.  Cuidados vaginales   Si le realizaron una episiotoma o tuvo un desgarro vaginal, contrlese la zona todos los das para detectar signos de infeccin. Est atenta a los siguientes signos:  ? Aumento del enrojecimiento, la hinchazn o el dolor.  ? Mayor presencia de lquido o sangre.  ? Calor.  ? Pus o mal olor.   No use tampones ni se haga duchas vaginales hasta que el mdico la autorice.   Controle la sangre que elimina por la vagina para detectar cogulos de sangre. Estos pueden tener el aspecto de grumos de color rojo oscuro, o secrecin marrn o negra.  Instrucciones generales   Mantenga el perineo limpio y seco, como se lo haya indicado el mdico.   Use ropa cmoda y suelta.   Cuando vaya al bao, siempre higiencese de adelante hacia atrs.   Pregntele al mdico si puede ducharse o tomar baos de inmersin.   Si se le realiz una episiotoma o tuvo un desgarro perineal durante el trabajo del parto o el parto, es posible que el mdico le indique que no tome baos de inmersin durante un determinado tiempo.   Use un sostn que sujete y ajuste bien sus pechos.   Si es posible, pdale a alguien que la ayude con las tareas del hogar y a cuidar del beb durante al menos algunos das despus de que le den el alta del hospital.   Concurra a todas las visitas de seguimiento para usted y el beb, como se lo haya indicado el  mdico. Esto es importante.  Comunquese con un mdico si:   Tiene los siguientes sntomas:  ? Secrecin vaginal que tiene mal olor.  ? Dificultad para orinar.  ? Dolor al orinar.  ? Aumento o disminucin repentinos de la frecuencia de las deposiciones.  ? Ms enrojecimiento, hinchazn o dolor alrededor de la episiotoma o del desgarro vaginal.  ? Ms secrecin de lquido o sangre de la episiotoma o del desgarro vaginal.  ? Pus o mal olor proveniente de la episiotoma o del desgarro vaginal.  ? Fiebre.  ? Erupcin cutnea.  ? Poco inters o falta de inters en actividades que solan gustarle.  ? Dudas sobre su cuidado y el del beb.   Siente la episiotoma o el desgarro vaginal caliente al tacto.   La episiotoma o el desgarro vaginal se abren o no parecen cicatrizar.   Siente dolor en las mamas, o estn duras o enrojecidas.   Siente tristeza o preocupacin de forma inusual.   Siente nuseas o vomita.   Elimina cogulos de sangre grandes por la vagina. Si expulsa un cogulo de sangre por la vagina, gurdelo para mostrrselo a su mdico. No tire la cadena sin que el mdico examine el cogulo de sangre antes.   Orina ms de lo habitual.   Se siente mareada o se desmaya.   No ha amamantado para nada y no ha tenido un perodo menstrual durante 12 semanas despus del parto.   Dej de amamantar al beb y no ha tenido su perodo menstrual durante 12 semanas despus de dejar de amamantar.  Solicite ayuda de inmediato si:   Tiene los siguientes sntomas:  ? Dolor que no desaparece o no mejora con medicamentos.  ? Dolor en el pecho.  ? Dificultad para respirar.  ? Visin borrosa o manchas en la vista.  ? Pensamientos de autolesionarse o lesionar al beb.   Comienza a sentir dolor en el abdomen o en una de las piernas.   Presenta un dolor de cabeza intenso.   Se desmaya.   Tiene una hemorragia de la vagina tan intensa que empapa dos toallitas sanitarias en una hora.  Esta informacin no tiene como fin  reemplazar el consejo del mdico. Asegrese de hacerle al mdico cualquier pregunta que tenga.  Document Released: 11/08/2005 Document Revised: 03/02/2017 Document Reviewed: 11/23/2015  Elsevier Interactive Patient Education  2018 [employer]

## 2018-04-19 ENCOUNTER — Encounter: Payer: Self-pay | Admitting: Advanced Practice Midwife

## 2018-05-15 ENCOUNTER — Ambulatory Visit: Payer: Self-pay | Admitting: Advanced Practice Midwife

## 2018-05-15 ENCOUNTER — Ambulatory Visit (INDEPENDENT_AMBULATORY_CARE_PROVIDER_SITE_OTHER): Payer: Self-pay | Admitting: Advanced Practice Midwife

## 2018-05-15 DIAGNOSIS — Z5329 Procedure and treatment not carried out because of patient's decision for other reasons: Secondary | ICD-10-CM

## 2018-05-15 DIAGNOSIS — Z91199 Patient's noncompliance with other medical treatment and regimen due to unspecified reason: Secondary | ICD-10-CM

## 2018-05-15 NOTE — Progress Notes (Signed)
Pt called in lobby several times and did not answer. Front office personal did not know pt's status. In-house spanish interpreter called pt's cell and left message for pt to reschedule. Called house phone and no one answered and no option to leave message

## 2018-05-22 ENCOUNTER — Encounter: Payer: Self-pay | Admitting: Advanced Practice Midwife

## 2018-07-27 ENCOUNTER — Ambulatory Visit (INDEPENDENT_AMBULATORY_CARE_PROVIDER_SITE_OTHER): Payer: Self-pay | Admitting: General Practice

## 2018-07-27 DIAGNOSIS — R102 Pelvic and perineal pain unspecified side: Secondary | ICD-10-CM

## 2018-07-27 LAB — POCT URINALYSIS DIP (DEVICE)
Bilirubin Urine: NEGATIVE
Glucose, UA: NEGATIVE mg/dL
Hgb urine dipstick: NEGATIVE
Ketones, ur: NEGATIVE mg/dL
NITRITE: NEGATIVE
PROTEIN: NEGATIVE mg/dL
Specific Gravity, Urine: 1.025 (ref 1.005–1.030)
UROBILINOGEN UA: 0.2 mg/dL (ref 0.0–1.0)
pH: 5.5 (ref 5.0–8.0)

## 2018-07-27 NOTE — Progress Notes (Signed)
Patient presents to office today for pain. Patient states she has a deep pain in her labia near the bone. Also reports pain in joints and all over her whole body. Patient reports she is unable to sleep as well. Patient states the pain in her labia comes and goes over the past few weeks. Patient denies dysuria or difficulty emptying her bladder. Discussed with patient she can make an appt with a provider in the office for her vaginal pain but she should see PCP for the other pains she is having and for difficulty sleeping. Patient verbalized understanding to all & had no questions. Alexandra Hardy used for interpreter today.

## 2018-08-15 ENCOUNTER — Ambulatory Visit: Payer: Self-pay | Attending: Family Medicine | Admitting: Family Medicine

## 2018-08-15 ENCOUNTER — Encounter: Payer: Self-pay | Admitting: Family Medicine

## 2018-08-15 VITALS — BP 117/78 | HR 72 | Temp 98.1°F | Resp 16 | Ht 61.5 in | Wt 129.6 lb

## 2018-08-15 DIAGNOSIS — M2550 Pain in unspecified joint: Secondary | ICD-10-CM

## 2018-08-15 DIAGNOSIS — Z833 Family history of diabetes mellitus: Secondary | ICD-10-CM | POA: Insufficient documentation

## 2018-08-15 DIAGNOSIS — M545 Low back pain, unspecified: Secondary | ICD-10-CM

## 2018-08-15 DIAGNOSIS — M5440 Lumbago with sciatica, unspecified side: Secondary | ICD-10-CM

## 2018-08-15 DIAGNOSIS — R058 Other specified cough: Secondary | ICD-10-CM

## 2018-08-15 DIAGNOSIS — G479 Sleep disorder, unspecified: Secondary | ICD-10-CM

## 2018-08-15 DIAGNOSIS — J309 Allergic rhinitis, unspecified: Secondary | ICD-10-CM

## 2018-08-15 DIAGNOSIS — Z8632 Personal history of gestational diabetes: Secondary | ICD-10-CM

## 2018-08-15 DIAGNOSIS — R05 Cough: Secondary | ICD-10-CM

## 2018-08-15 LAB — POCT GLYCOSYLATED HEMOGLOBIN (HGB A1C): Hemoglobin A1C: 4.6 % (ref 4.0–5.6)

## 2018-08-15 MED ORDER — CETIRIZINE HCL 10 MG PO TABS: 10.0000 mg | ORAL_TABLET | Freq: Every day | ORAL | 2 refills | Status: DC

## 2018-08-16 LAB — CBC WITH DIFFERENTIAL/PLATELET
Basophils Absolute: 0 x10E3/uL (ref 0.0–0.2)
Basos: 0 %
EOS (ABSOLUTE): 0.3 x10E3/uL (ref 0.0–0.4)
Eos: 7 %
Hematocrit: 40.4 % (ref 34.0–46.6)
Hemoglobin: 13.7 g/dL (ref 11.1–15.9)
Immature Grans (Abs): 0 x10E3/uL (ref 0.0–0.1)
Immature Granulocytes: 0 %
Lymphocytes Absolute: 1.4 x10E3/uL (ref 0.7–3.1)
Lymphs: 30 %
MCH: 32.3 pg (ref 26.6–33.0)
MCHC: 33.9 g/dL (ref 31.5–35.7)
MCV: 95 fL (ref 79–97)
Monocytes Absolute: 0.4 x10E3/uL (ref 0.1–0.9)
Monocytes: 8 %
Neutrophils Absolute: 2.5 x10E3/uL (ref 1.4–7.0)
Neutrophils: 55 %
Platelets: 243 x10E3/uL (ref 150–450)
RBC: 4.24 x10E6/uL (ref 3.77–5.28)
RDW: 13.5 % (ref 12.3–15.4)
WBC: 4.7 x10E3/uL (ref 3.4–10.8)

## 2018-08-16 LAB — ARTHRITIS PANEL
Anti Nuclear Antibody(ANA): NEGATIVE
Rheumatoid fact SerPl-aCnc: <10 [IU]/mL (ref 0.0–13.9)
Sed Rate: 31 mm/h (ref 0–32)
Uric Acid: 4.9 mg/dL (ref 2.5–7.1)

## 2018-08-16 LAB — VITAMIN D 25 HYDROXY (VIT D DEFICIENCY, FRACTURES): Vit D, 25-Hydroxy: 13.6 ng/mL — ABNORMAL LOW (ref 30.0–100.0)

## 2018-08-16 LAB — TSH: TSH: 1.56 u[IU]/mL (ref 0.450–4.500)

## 2018-08-17 ENCOUNTER — Encounter: Payer: Self-pay | Admitting: Family Medicine

## 2018-08-17 NOTE — Progress Notes (Signed)
Subjective:    Patient ID: Alexandra Hardy, female    DOB: December 22, 1990, 27 y.o.   MRN: [id number]   Due to a language barrier, an interpreter accompanied patient to today's visit  HPI 27 year old female new to the practice who presents to establish care and patient has complaint of feeling as if she is aching all over.  Patient states that she feels as if her bones are hurting.  Patient states that she also feels as if she has some numbness in her feet.  Patient also states that she is not sleeping well.  Patient states that she feels as if she has difficulty falling asleep as well as staying asleep.  Patient denies any anxiety or depression.  Patient denies any issues with gestational diabetes with her current pregnancy but states that during her first pregnancy in 2016 she did have gestational diabetes but did not need to be on any medication.  Patient does not feel as if she is having any increased thirst or urinary frequency.  Patient states that she has had some occasional cough and wheezing as well as nasal congestion with postnasal drainage.  Patient states that her cough has been productive of white sputum but she has occasionally seen a small amount of green in the sputum that she coughs up.  Patient denies any fever or chills.  No headache or dizziness.  Patient reports that she does have some occasional low back pain.  Patient feels as if the pain occasionally radiates into her buttocks.  Patient also has some occasional sensation of numbness in her right foot on the same side as her back pain.      Patient reports that she does not smoke or drink.  Patient is on no prescription medications other than prenatal multivitamins.  Patient denies any significant past medical history other than the gestational diabetes in 2016.    Past Medical History:  Diagnosis Date  . Gestational diabetes   . History of HPV infection    Past Surgical History:  Procedure Laterality Date  . NO PAST  SURGERIES     Family History  Problem Relation Age of Onset  . Diabetes Father    Social History   Tobacco Use  . Smoking status: Never Smoker  . Smokeless tobacco: Never Used  Substance Use Topics  . Alcohol use: No  . Drug use: No  No Known Allergies    Review of Systems  Constitutional: Positive for fatigue. Negative for chills and fever.  HENT: Positive for congestion and postnasal drip. Negative for sore throat and trouble swallowing.   Respiratory: Positive for cough. Negative for shortness of breath.   Cardiovascular: Negative for chest pain, palpitations and leg swelling.  Gastrointestinal: Negative for abdominal pain and nausea.  Genitourinary: Negative for dysuria and frequency.  Musculoskeletal: Positive for arthralgias, back pain and myalgias. Negative for gait problem and joint swelling.  Neurological: Negative for dizziness and headaches.       Objective:   Physical Exam BP 117/78   Pulse 72   Temp 98.1 F (36.7 C) (Oral)   Resp 16   Ht 5' 1.5" (1.562 m)   Wt 129 lb 9.6 oz (58.8 kg)   SpO2 99%   BMI 24.09 kg/m Vital signs and nurse's note reviewed General- well-nourished, well-developed female in no acute distress.  Patient is accompanied by an interpreter at today's visit.  Patient also has her infant with her. ENT- TMs dull, patient with pale, boggy nasal turbinates with  edema and clear to white nasal discharge, patient with posterior pharynx erythema with cobblestoning Neck-supple, no lymphadenopathy, no thyromegaly Cardiovascular-regular rate and rhythm Abdomen-soft, nontender Back-patient with mild lumbosacral paraspinous spasm and mild midline discomfort to palpation in the lumbosacral area as well as right-sided SI joint tenderness and with seated leg raise, patient has onset of mild numb sensation on the inside and bottom of her right foot  Extremities-no edema Musculoskeletal- patient does not have any reproducible joint pain at the shoulders,  elbows, wrist, hands/fingers, knees, hips or ankles.  Patient does not have any swelling or increased warmth of any joints.        Assessment & Plan:  1. History of gestational diabetes mellitus (GDM), not currently pregnant Patient with history of gestational diabetes in 2016 and patient with complaint of fatigue at today's visit.  Patient had hemoglobin A1c done in the office at today's visit which was normal at 4.6. - POCT glycosylated hemoglobin (Hb A1C)  2. Difficulty sleeping Patient with complaint of difficulty falling asleep and staying asleep.  Patient is currently breast-feeding.  Patient however states that her child is sleeping through the night.  Patient is being prescribed cetirizine to take at bedtime to help with allergic rhinitis with postnasal drainage and I discussed with the patient that this medication often causes the side effect of drowsiness therefore the medication may also help improve her sleep.  Patient should make sure that her bedroom is dark and quiet and that she is not watching television or reading in bed.  3. Low back pain with radiation Discussed with patient that I believe that the occasional numb sensation that she gets in her right foot may be related to a pinched nerve in her lower back.  At this time, patient states that her back pain is mild.  Patient may take Tylenol as needed for the pain.  When patient is no longer breast-feeding, she may wish to take a prednisone taper as well as nonsteroidal anti-inflammatories to help with her back pain.  Patient should however return sooner if she has increased pain, numbness or any concerns.  4. Multiple joint pain Patient with complaint of multiple joint pain but does not really have any reproducible joint pain on exam and no significant joint abnormalities.  Will check TSH to look for thyroid disorder, CBC to look for anemia, arthritis panel and vitamin D level to look for vitamin D deficiency for possible causes  of patient's joint pain - TSH - CBC with Differential - Arthritis Panel - Vitamin D, 25-hydroxy  5. Recurrent cough Patient reports a recurrent cough along with sensation of postnasal drainage.  Patient does have evidence of allergic rhinitis on examination and patient agrees to start use of an allergy medication to see if her cough decreases once her postnasal drainage is treated  6. Allergic rhinitis, unspecified seasonality, unspecified trigger Patient with evidence of nasal congestion consistent with allergic rhinitis on examination and patient agrees to try Zyrtec 10 mg at bedtime to help with postnasal drainage and also to see if this helps with her recurrent cough.  Discussed with the patient that if she feels that the 10 mg dose causes her to feel dried out or makes her too drowsy at night or the next morning, patient can take half pill at bedtime - cetirizine (ZYRTEC) 10 MG tablet; Take 1 tablet (10 mg total) by mouth at bedtime. For nasal congestion/postnasal drainage  Dispense: 30 tablet; Refill: 2  *Influenza immunization was offered but  declined by the patient at today's visit  An After Visit Summary was printed and given to the patient.  Return in about 3 weeks (around 09/05/2018).

## 2018-08-18 ENCOUNTER — Other Ambulatory Visit: Payer: Self-pay

## 2018-08-18 ENCOUNTER — Telehealth: Payer: Self-pay

## 2018-08-18 NOTE — Telephone Encounter (Signed)
[employer] [facility]  Id#  [id number] contacted pt to go over lab results   If pt calls back please give results: thyroid, blood count and arthritis panel are normal  however her vitamin D level was very low and this could be the cause of her joint pain rx sent for high dose vit d to pharmacy

## 2018-09-05 ENCOUNTER — Ambulatory Visit: Payer: Self-pay | Attending: Family Medicine | Admitting: Family Medicine

## 2018-09-05 ENCOUNTER — Encounter: Payer: Self-pay | Admitting: Family Medicine

## 2018-09-05 VITALS — BP 96/62 | HR 84 | Temp 98.2°F | Resp 18 | Ht 62.0 in | Wt 130.0 lb

## 2018-09-05 DIAGNOSIS — G4700 Insomnia, unspecified: Secondary | ICD-10-CM | POA: Insufficient documentation

## 2018-09-05 DIAGNOSIS — E559 Vitamin D deficiency, unspecified: Secondary | ICD-10-CM

## 2018-09-05 DIAGNOSIS — R5383 Other fatigue: Secondary | ICD-10-CM

## 2018-09-05 DIAGNOSIS — R05 Cough: Secondary | ICD-10-CM

## 2018-09-05 DIAGNOSIS — G479 Sleep disorder, unspecified: Secondary | ICD-10-CM

## 2018-09-05 DIAGNOSIS — R059 Cough, unspecified: Secondary | ICD-10-CM

## 2018-09-05 MED ORDER — VITAMIN D (ERGOCALCIFEROL) 1.25 MG (50000 UNIT) PO CAPS: ORAL_CAPSULE | ORAL | 6 refills | Status: DC

## 2018-09-05 MED ORDER — ALBUTEROL SULFATE HFA 108 (90 BASE) MCG/ACT IN AERS: 2.0000 | INHALATION_SPRAY | Freq: Four times a day (QID) | RESPIRATORY_TRACT | 6 refills | Status: DC | PRN

## 2018-09-05 NOTE — Progress Notes (Signed)
Subjective:    Patient ID: Alexandra Hardy, female    DOB: 10-30-1991, 27 y.o.   MRN: [id number]   Due to a language barrier, video interpreter service was used at today's visit  HPI patient is a 27 year old female who was seen in follow-up of her recent visit at which time patient complained of fatigue, difficulty falling and staying asleep, recurrent cough and multiple joint pain/achiness.  Patient reports that since her last visit, her joint pain and achiness have resolved.  Patient reports that her sleep has improved with the use of the medication that was sent in for her at her last visit. (Patient did not receive an actual sleep aid at her last visit but cetirizine was sent in for patient to take at bedtime to help with postnasal drainage as this medication can also cause drowsiness).  Patient states that she has not received the lab results from her last visit.  Patient reports that she continues to have occasional cough but this does not occur on a daily basis.  Cough is nonproductive.  Patient states that the cough is more likely to occur after she begins to exercise or with some changes in the weather.  Patient sometimes hears a noise in her chest when she is having a cough.  Patient believes that it may be wheezing.  Patient denies any family history or any personal history of asthma that she is aware of.  Patient reports that overall she feels better than she did at her most recent visit. Past Medical History:  Diagnosis Date  . Gestational diabetes   . History of HPV infection    Past Surgical History:  Procedure Laterality Date  . NO PAST SURGERIES     Family History  Problem Relation Age of Onset  . Diabetes Father    Social History   Tobacco Use  . Smoking status: Never Smoker  . Smokeless tobacco: Never Used  Substance Use Topics  . Alcohol use: No  . Drug use: No  No Known Allergies   Review of Systems  Constitutional: Positive for fatigue (improved).  Negative for appetite change, chills, diaphoresis, fever and unexpected weight change.  HENT: Negative for congestion, postnasal drip, sore throat and trouble swallowing.   Respiratory: Positive for cough. Negative for shortness of breath.   Cardiovascular: Negative for chest pain, palpitations and leg swelling.  Gastrointestinal: Negative for abdominal pain, constipation and nausea.  Genitourinary: Negative for dysuria, flank pain and frequency.  Musculoskeletal: Negative for arthralgias, back pain, gait problem, joint swelling and myalgias.  Neurological: Negative for dizziness, light-headedness and headaches.       Objective:   Physical Exam BP 96/62 (BP Location: Left Arm, Patient Position: Sitting, Cuff Size: Normal)   Pulse 84   Temp 98.2 F (36.8 C) (Oral)   Resp 18   Ht 5\' 2"  (1.575 m)   Wt 130 lb (59 kg)   SpO2 100%   BMI 23.78 kg/m  nurse's notes and vital signs reviewed at today's visit General-well-nourished, well-developed adult female in no acute distress.  Patient is accompanied by her 55-year-old daughter at today's visit ENT- TMs dull, patient with mild edema of the nasal turbinates, patient with mild posterior pharynx erythema Neck-supple, patient with borderline thyromegaly, no carotid bruit, no lymphadenopathy Lungs-clear to auscultation bilaterally Cardiovascular-regular rate and rhythm Abdomen-soft, nontender Back-no CVA tenderness Extremities-no edema Musculoskeletal- no reproducible joint pain, no joint swelling or increased warmth of the joints       Assessment &  Plan:  1. Fatigue, unspecified type Patient reports that she has had some improvement in her fatigue since her last visit.  Patient also states that her achiness and joint pain have also improved.  Labs from patient's most recent visit were reviewed with the patient at today's visit.  Patient states that she had not been made aware of her low vitamin D level.  Patient was made aware that she had a  normal complete blood count, normal thyroid test/TSH and normal arthritis panel.  Patient's hemoglobin A1c at her last visit was also normal.  2. Cough Patient reports that her cough tends to be worse with exercise and with some changes in the weather.  Discussed with patient that she may have exercise-induced or cough variant asthma.  Patient given prescription for albuterol inhaler to use prior to exercise or with episodes of recurrent cough.  Patient however has also been asked to obtain a chest x-ray.  Patient should continue the use of Zyrtec to help with postnasal drainage. - albuterol (PROVENTIL HFA;VENTOLIN HFA) 108 (90 Base) MCG/ACT inhaler; Inhale 2 puffs into the lungs every 6 (six) hours as needed for wheezing or shortness of breath. Or cough  Dispense: 1 Inhaler; Refill: 6 - DG Chest 2 View; Future  3. Vitamin D deficiency Patient's vitamin D level at her most recent visit was very low at 13.6.  Since patient is breast-feeding, instead of having her take high-dose vitamin D twice per week, will have her take vitamin D once weekly in the evening after she has finished breast-feeding for the day.  Will recheck vitamin D level within the next 6 to 12 months. - Vitamin D, Ergocalciferol, (DRISDOL) 50000 units CAPS capsule; One capsule once per week; take after last breastfeeding of the day (Patient not taking: Reported on 09/05/2018)  Dispense: 4 capsule; Refill: 6  4. Sleep disturbance Patient with complaint of some improvement in her inability to sleep.  Patient has been taking the cetirizine prescribed at her last visit to help with postnasal drainage and allergic rhinitis but she believed that this was a sleeping pill as she states that she did get better sleep on the nights that she took this medication.  Patient should continue the use of cetirizine to help with postnasal drainage but on the nights that she does not feel she needs the cetirizine, patient was again encouraged to try the use  of over-the-counter melatonin starting at 3 mg at bedtime as needed to help with sleep.   An After Visit Summary was printed and given to the patient.  No follow-ups on file.

## 2018-09-26 ENCOUNTER — Ambulatory Visit: Payer: Self-pay

## 2018-09-27 ENCOUNTER — Telehealth: Payer: Self-pay | Admitting: Family Medicine

## 2018-09-27 ENCOUNTER — Ambulatory Visit: Payer: Self-pay

## 2018-09-27 NOTE — Telephone Encounter (Signed)
Patient came in person to see if she could get another appointment for the imaging chest order that was put in. She says that she missed the appointment she was scheduled for on Oct 25th. Please follow up with patient.

## 2018-09-29 NOTE — Telephone Encounter (Signed)
The chest x-ray order should still be good since its not a scheduled test but let me know if I need to enter another order

## 2018-10-02 ENCOUNTER — Ambulatory Visit: Payer: Self-pay | Attending: Family Medicine

## 2018-10-02 DIAGNOSIS — Z30019 Encounter for initial prescription of contraceptives, unspecified: Secondary | ICD-10-CM

## 2018-10-02 MED ORDER — MEDROXYPROGESTERONE ACETATE 150 MG/ML IM SUSP: 150.0000 mg | Freq: Once | INTRAMUSCULAR | Status: DC

## 2018-10-03 NOTE — Progress Notes (Signed)
Patient did not receive depo shot.

## 2018-10-05 ENCOUNTER — Ambulatory Visit: Payer: Self-pay

## 2018-10-05 NOTE — Telephone Encounter (Signed)
Please see if patient can be contacted so that she can go have the CXR

## 2018-10-24 NOTE — Telephone Encounter (Signed)
Patient called back, states she will first come in and apply for CAFA to cover the imaging appointment, once she has the coverage she will call back for the appointment.

## 2018-10-24 NOTE — Telephone Encounter (Signed)
Unable to reach patient. Voicemail is not set up. 

## 2019-05-02 ENCOUNTER — Encounter: Payer: Self-pay | Admitting: *Deleted

## 2019-10-16 ENCOUNTER — Ambulatory Visit: Payer: Self-pay | Attending: Family Medicine | Admitting: Physician Assistant

## 2019-10-16 ENCOUNTER — Other Ambulatory Visit: Payer: Self-pay

## 2019-10-16 VITALS — BP 104/68 | HR 68 | Temp 98.5°F | Resp 18 | Ht 62.0 in | Wt 120.0 lb

## 2019-10-16 DIAGNOSIS — K429 Umbilical hernia without obstruction or gangrene: Secondary | ICD-10-CM

## 2019-10-16 DIAGNOSIS — R109 Unspecified abdominal pain: Secondary | ICD-10-CM

## 2019-10-16 DIAGNOSIS — L089 Local infection of the skin and subcutaneous tissue, unspecified: Secondary | ICD-10-CM

## 2019-10-16 LAB — POCT URINALYSIS DIP (CLINITEK)
Bilirubin, UA: NEGATIVE
Glucose, UA: NEGATIVE mg/dL
Ketones, POC UA: NEGATIVE mg/dL
Leukocytes, UA: NEGATIVE
Nitrite, UA: NEGATIVE
POC PROTEIN,UA: NEGATIVE
Spec Grav, UA: 1.01 (ref 1.010–1.025)
Urobilinogen, UA: 0.2 E.U./dL
pH, UA: 6.5 (ref 5.0–8.0)

## 2019-10-16 LAB — POCT URINE PREGNANCY: Preg Test, Ur: NEGATIVE

## 2019-10-16 MED ORDER — MUPIROCIN 2 % EX OINT: TOPICAL_OINTMENT | CUTANEOUS | 0 refills | Status: DC

## 2019-10-16 NOTE — Patient Instructions (Signed)
Hernia, en adultos Hernia, Adult     Una hernia ocurre cuando tejido dentro del cuerpo protruye a travs de un punto debilitado de los msculos del vientre (pared abdominal). Esto provoca una protuberancia redondeada (bulto). Esta protuberancia puede estar:  En una cicatriz de una ciruga realizada en su vientre (hernia incisional).  Cerca de la parte inferior del vientre (hernia umbilical).  En la ingle (hernia inguinal). La ingle es TEFL teacher en el que se unen las piernas con la parte inferior del vientre (abdomen). Este tipo de hernia tambin puede aparecer en: ? En el escroto, si es varn. ? En los pliegues de la piel alrededor de la vagina, si es [place].  En la parte superior del muslo (hernia crural).  Dentro del vientre (hernia de hiato). Se produce cuando el estmago se desliza por arriba del msculo entre el vientre y el trax (diafragma). Si la hernia es pequea y no causa dolor, tal vez no sea necesario Arts administrator. Si la hernia es grande y Nurse, mental health, tal vez necesite realizarse [place]. Siga estas indicaciones en su casa: Actividad  Retail banker o Tourist information centre manager (esforzar) los msculos que estn cerca de la hernia. El esfuerzo puede ocurrir cuando: ? Levanta algo pesado. ? Defeca (hace deposiciones).  No levante ningn objeto que pese ms de 10libras (4,5kg), o el lmite que su mdico le diga, hasta que le comunique que es seguro.  Para levantar objetos pesados, use la fuerza de las piernas. No use solo los msculos de la espalda para levantarlos. Instrucciones generales  Haga estas cosas si se lo indica su mdico para no tener dificultad para defecar (estreimiento): ? Beba suficiente lquido para Theatre manager el pis (orina) de color amarillo plido. ? Coma alimentos ricos en fibra. Entre ellos, frutas y verduras frescas, cereales integrales y frijoles. ? Limite los alimentos con alto contenido de grasas y azcares procesados. Estos incluyen alimentos  fritos o dulces. ? Tome medicamentos para la dificultad para defecar.  Cuando tosa, hgalo con suavidad.  Puede empujar su hernia hacia adentro presionando muy suavemente sobre esta cuando est acostado. No intente forzar el bulto hacia adentro nuevamente si este no entra fcilmente.  Si tiene sobrepeso, trabaje con el mdico para adelgazar sin riegos.  No consuma ningn producto que contenga nicotina o tabaco. Esto incluye cigarrillos y cigarrillos electrnicos. Si necesita ayuda para dejar de fumar, consulte al [employer].  Si le programan una ciruga (reparacin de una hernia), controle la hernia para detectar cualquier cambio en la forma, tamao o color. Informe al mdico si observa algn cambio.  Tome los medicamentos de venta libre y los recetados solamente como se lo haya indicado el mdico.  Consulting civil engineer a todas las visitas de control como se lo haya indicado el mdico. Comunquese con un mdico si:  Tiene un dolor nuevo, hinchazn o enrojecimiento cerca de la hernia.  Defeca menos veces por semana que lo normal.  Tiene dificultad para defecar.  La materia fecal (heces) es ms seca que lo normal.  Tiene heces que son ms duras o grandes que lo normal. Solicite ayuda de inmediato si:  Tiene fiebre.  Siente un dolor en la zona baja del vientre que empeora.  Siente malestar estomacal (nuseas).  Vomita.  No puede empujar su hernia hacia adentro al presionar muy suavemente sobre esta cuando est [place]. No intente forzar el bulto hacia adentro nuevamente si este no entra fcilmente.  La hernia: ? Cambia de forma o de tamao. ? Cambia de  color. ? Se siente dura o le duele cuando la toca. Estos sntomas pueden representar un problema grave que constituye una emergencia. No espere hasta que los sntomas desaparezcan. Solicite atencin mdica de inmediato. Comunquese con el servicio de emergencias de su localidad (911 en los [place]). Resumen  Una hernia ocurre cuando  tejido dentro del cuerpo protruye a travs de un punto debilitado de los msculos del vientre. Esto crea un bulto.  Si la hernia es pequea y no causa dolor, tal vez no sea necesario realizar un tratamiento. Si la hernia es grande y causa dolor, tal vez necesite realizarse una ciruga.  Si le programan una ciruga, controle la hernia para detectar cualquier cambio en la forma, tamao o color. Informe al mdico si hay algn cambio. Esta informacin no tiene como fin reemplazar el consejo del mdico. Asegrese de hacerle al mdico cualquier pregunta que tenga. Document Released: 08/29/2013 Document Revised: 02/03/2018 Document Reviewed: 10/26/2017 Elsevier Patient Education  2020 Elsevier Inc.  

## 2019-10-16 NOTE — Progress Notes (Signed)
neg

## 2019-10-16 NOTE — Progress Notes (Signed)
Patient ID: Alexandra Hardy, female   DOB: 02/01/1991, 28 y.o.   MRN: [id number]   Alexandra Hardy, is a 28 y.o. female  [id number]:[id number]  [id number]  DOB - 1991/04/19  Subjective:  Chief Complaint and HPI: Alexandra Hardy is a 28 y.o. female here today with pain in the umbilicus.  Feels as though her belly button is getting bigger over the last few months.  The skin around it is tender and aggravated. No f/c.  No pelvic pain.  Ary with [employer] translating.    ROS:   Constitutional:  No f/c, No night sweats, No unexplained weight loss. EENT:  No vision changes, No blurry vision, No hearing changes. No mouth, throat, or ear problems.  Respiratory: No cough, No SOB Cardiac: No CP, no palpitations GI:  No abd pain, No N/V/D. GU: No Urinary s/sx Musculoskeletal: No joint pain Neuro: No headache, no dizziness, no motor weakness.  Skin: No rash Endocrine:  No polydipsia. No polyuria.  Psych: Denies SI/HI  No problems updated.  ALLERGIES: No Known Allergies  PAST MEDICAL HISTORY: Past Medical History:  Diagnosis Date  . Gestational diabetes   . History of HPV infection     MEDICATIONS AT HOME: Prior to Admission medications   Medication Sig Start Date End Date Taking? Authorizing Provider  acetaminophen (TYLENOL) 325 MG tablet Take 650 mg by mouth every 6 (six) hours as needed for moderate pain or headache.   Yes [provider]  albuterol (PROVENTIL HFA;VENTOLIN HFA) 108 (90 Base) MCG/ACT inhaler Inhale 2 puffs into the lungs every 6 (six) hours as needed for wheezing or shortness of breath. Or cough 09/05/18  Yes Fulp, Cammie, MD  cetirizine (ZYRTEC) 10 MG tablet Take 1 tablet (10 mg total) by mouth at bedtime. For nasal congestion/postnasal drainage 08/15/18  Yes Fulp, Cammie, MD  Vitamin D, Ergocalciferol, (DRISDOL) 50000 units CAPS capsule One capsule once per week; take after last breastfeeding of the day 09/05/18  Yes Fulp,  Cammie, MD  cyclobenzaprine (FLEXERIL) 5 MG tablet Take 1 tablet (5 mg total) by mouth 3 (three) times daily as needed for muscle spasms. Patient not taking: Reported on 08/15/2018 01/05/18   Marylene Land, CNM  ibuprofen (ADVIL,MOTRIN) 600 MG tablet Take 1 tablet (600 mg total) by mouth every 6 (six) hours as needed. Patient not taking: Reported on 08/15/2018 04/14/18   Felisa Bonier, MD  mupirocin ointment (BACTROBAN) 2 % Apply bid to AA x 1 week 10/16/19   Anders Simmonds, PA-C  Prenatal Vit-Fe Fumarate-FA (PRENATAL VITAMIN PO) Take 1 tablet by mouth daily.     [provider]     Objective:  EXAM:   Vitals:   10/16/19 1551  BP: 104/68  Pulse: 68  Resp: 18  Temp: 98.5 F (36.9 C)  TempSrc: Oral  SpO2: 99%  Weight: 120 lb (54.4 kg)  Height: 5\' 2"  (1.575 m)    General appearance : A&OX3. NAD. Non-toxic-appearing HEENT: Atraumatic and Normocephalic.  PERRLA. EOM intact.  Chest/Lungs:  Breathing-non-labored, Good air entry bilaterally, breath sounds normal without rales, rhonchi, or wheezing  CVS: S1 S2 regular, no murmurs, gallops, rubs  Abdomen: Bowel sounds present, Non tender and not distended with no gaurding, rigidity or rebound.  She has about a 3cm reducible umbilical hernia w/o signs of strangulation.  There is some erythema and irritation of the skin without induration or fluctuance.   Extremities: Bilateral Lower Ext shows no edema, both legs are warm to touch  with = pulse throughout Neurology:  CN II-XII grossly intact, Non focal.   Psych:  TP linear. J/I WNL. Normal speech. Appropriate eye contact and affect.  Skin:  No Rash  Data Review Lab Results  Component Value Date   HGBA1C 4.6 08/15/2018   HGBA1C 5.3 10/05/2017     Assessment & Plan   1. Abdominal pain, unspecified abdominal location - POCT URINALYSIS DIP (CLINITEK) - POCT urine pregnancy  2. Umbilical hernia without obstruction and without gangrene - Ambulatory referral to  General Surgery -gangrene/obstruction/incarceration issues discussed-to ED if develops.  Patient expresses understanding.    3. Skin infection - mupirocin ointment (BACTROBAN) 2 %; Apply bid to AA x 1 week  Dispense: 22 g; Refill: 0 Patient have been counseled extensively about nutrition and exercise  Return if symptoms worsen or fail to improve.  The patient was given clear instructions to go to ER or return to medical center if symptoms don't improve, worsen or new problems develop. The patient verbalized understanding. The patient was told to call to get lab results if they haven't heard anything in the next week.     Georgian Co, PA-C [facility] [place], [place] [phone]   10/16/2019, 4:03 PM

## 2019-10-24 ENCOUNTER — Ambulatory Visit: Payer: Self-pay

## 2019-10-26 ENCOUNTER — Ambulatory Visit: Payer: Self-pay

## 2019-10-29 ENCOUNTER — Ambulatory Visit: Payer: Self-pay

## 2019-11-07 ENCOUNTER — Ambulatory Visit: Payer: Self-pay

## 2019-12-14 ENCOUNTER — Other Ambulatory Visit: Payer: Self-pay

## 2019-12-14 ENCOUNTER — Ambulatory Visit
Admission: EM | Admit: 2019-12-14 | Discharge: 2019-12-14 | Disposition: A | Discharge status: Home / Self Care | Payer: Self-pay | Attending: Physician Assistant | Admitting: Physician Assistant

## 2019-12-14 DIAGNOSIS — K429 Umbilical hernia without obstruction or gangrene: Secondary | ICD-10-CM | POA: Insufficient documentation

## 2019-12-14 DIAGNOSIS — B9689 Other specified bacterial agents as the cause of diseases classified elsewhere: Secondary | ICD-10-CM

## 2019-12-14 DIAGNOSIS — N3090 Cystitis, unspecified without hematuria: Secondary | ICD-10-CM | POA: Insufficient documentation

## 2019-12-14 LAB — POCT URINALYSIS DIP (MANUAL ENTRY)
Bilirubin, UA: NEGATIVE
Glucose, UA: NEGATIVE mg/dL
Ketones, POC UA: NEGATIVE mg/dL
Nitrite, UA: NEGATIVE
Protein Ur, POC: 100 mg/dL — AB
Spec Grav, UA: 1.01 (ref 1.010–1.025)
Urobilinogen, UA: 0.2 E.U./dL
pH, UA: 5.5 (ref 5.0–8.0)

## 2019-12-14 MED ORDER — CEPHALEXIN 500 MG PO CAPS: 500.0000 mg | ORAL_CAPSULE | Freq: Two times a day (BID) | ORAL | 0 refills | Status: DC

## 2019-12-14 NOTE — ED Triage Notes (Signed)
Pt c/o burning and pain on urination x2 days, states saw blood after wiping but finished her menstrual cycle 2 days ago.

## 2019-12-14 NOTE — ED Provider Notes (Signed)
[name]    CSN: [id number] Arrival date & time: 12/14/19  1727      History   Chief Complaint Chief Complaint  Patient presents with  . Urinary Tract Infection    HPI Alexandra Hardy is a 29 y.o. female.   29 year old female comes in for multiple complaints. HPI obtained from patient through video translator.  1. 2 day history of urinary symptoms. Has had dysuria, frequency. Denies hematuria, but does noticed some blood after wiping. Patient recently finished cycle 2 days ago. Denies abdominal pain, nausea, vomiting. Denies fever, chills, flank/back pain. Denies vaginal discharge, itching. LMP 12/05/2019.  2. Patient with history of umbilical hernia since delivery 1 year ago. States sometimes can have some pain, but resolves. Has been able to reduce hernia. Denies enlargement of the hernia. States would like to discuss possible surgery.      Past Medical History:  Diagnosis Date  . Gestational diabetes   . History of HPV infection     Patient Active Problem List   Diagnosis Date Noted  . Normal labor 04/12/2018  . SVD (spontaneous vaginal delivery) 04/12/2018  . History of gestational diabetes in prior pregnancy, currently pregnant 11/02/2017  . Supervision of high risk pregnancy, antepartum, first trimester 10/05/2017  . Pap smear of cervix shows high risk HPV present 05/14/2015  . Language barrier affecting health care 03/12/2015    Past Surgical History:  Procedure Laterality Date  . NO PAST SURGERIES      OB History    Gravida  2   Para  2   Term  2   Preterm      AB      Living  2     SAB      TAB      Ectopic      Multiple  0   Live Births  2            Home Medications    Prior to Admission medications   Medication Sig Start Date End Date Taking? Authorizing Provider  acetaminophen (TYLENOL) 325 MG tablet Take 650 mg by mouth every 6 (six) hours as needed for moderate pain or headache.    [provider]  albuterol (PROVENTIL HFA;VENTOLIN HFA) 108 (90 Base) MCG/ACT inhaler Inhale 2 puffs into the lungs every 6 (six) hours as needed for wheezing or shortness of breath. Or cough 09/05/18   Fulp, Cammie, MD  cephALEXin (KEFLEX) 500 MG capsule Take 1 capsule (500 mg total) by mouth 2 (two) times daily. 12/14/19   Tasia Catchings, Becket Wecker V, PA-C  cetirizine (ZYRTEC) 10 MG tablet Take 1 tablet (10 mg total) by mouth at bedtime. For nasal congestion/postnasal drainage 08/15/18   Fulp, Ander Gaster, MD  Vitamin D, Ergocalciferol, (DRISDOL) 50000 units CAPS capsule One capsule once per week; take after last breastfeeding of the day 09/05/18   Antony Blackbird, MD    Family History Family History  Problem Relation Age of Onset  . Diabetes Father     Social History Social History   Tobacco Use  . Smoking status: Never Smoker  . Smokeless tobacco: Never Used  Substance Use Topics  . Alcohol use: No  . Drug use: No     Allergies   Patient has no known allergies.   Review of Systems Review of Systems  Reason unable to perform ROS: See HPI as above.     Physical Exam Triage Vital Signs ED Triage Vitals  Enc Vitals Group  BP 12/14/19 1742 102/67     Pulse Rate 12/14/19 1742 74     Resp 12/14/19 1742 16     Temp 12/14/19 1742 98.5 F (36.9 C)     Temp Source 12/14/19 1742 Oral     SpO2 12/14/19 1742 97 %     Weight --      Height --      Head Circumference --      Peak Flow --      Pain Score 12/14/19 1753 7     Pain Loc --      Pain Edu? --      Excl. in GC? --    No data found.  Updated Vital Signs BP 102/67 (BP Location: Left Arm)   Pulse 74   Temp 98.5 F (36.9 C) (Oral)   Resp 16   LMP 12/05/2019   SpO2 97%   Breastfeeding No   Physical Exam Constitutional:      General: She is not in acute distress.    Appearance: She is well-developed. She is not ill-appearing, toxic-appearing or diaphoretic.  HENT:     Head: Normocephalic and atraumatic.  Eyes:      Conjunctiva/sclera: Conjunctivae normal.     Pupils: Pupils are equal, round, and reactive to light.  Cardiovascular:     Rate and Rhythm: Normal rate and regular rhythm.  Pulmonary:     Effort: Pulmonary effort is normal. No respiratory distress.     Comments: LCTAB Abdominal:     General: Bowel sounds are normal.     Palpations: Abdomen is soft.     Tenderness: There is no abdominal tenderness. There is no right CVA tenderness, left CVA tenderness, guarding or rebound.     Hernia: A hernia is present. Hernia is present in the umbilical area.     Comments: Umbilical hernia that is soft, reproducible. No tenderness.   Musculoskeletal:     Cervical back: Normal range of motion and neck supple.  Skin:    General: Skin is warm and dry.  Neurological:     Mental Status: She is alert and oriented to person, place, and time.  Psychiatric:        Behavior: Behavior normal.        Judgment: Judgment normal.      UC Treatments / Results  Labs (all labs ordered are listed, but only abnormal results are displayed) Labs Reviewed  POCT URINALYSIS DIP (MANUAL ENTRY) - Abnormal; Notable for the following components:      Result Value   Clarity, UA cloudy (*)    Blood, UA large (*)    Protein Ur, POC =100 (*)    Leukocytes, UA Moderate (2+) (*)    All other components within normal limits  URINE CULTURE    EKG   Radiology No results found.  Procedures Procedures (including critical care time)  Medications Ordered in UC Medications - No data to display  Initial Impression / Assessment and Plan / UC Course  I have reviewed the triage vital signs and the nursing notes.  Pertinent labs & imaging results that were available during my care of the patient were reviewed by me and considered in my medical decision making (see chart for details).    Urine dipstick positive for UTI. Start antibiotics as directed. Push fluids. Return precautions given.  Patient with umbilical hernia  on exam, no strangulation or incarceration. On chart review, PCP has already referred to general surgery. Will have patient contact PCP  for information.  Final Clinical Impressions(s) / UC Diagnoses   Final diagnoses:  Cystitis  Umbilical hernia without obstruction and without gangrene   ED Prescriptions    Medication Sig Dispense Auth. Provider   cephALEXin (KEFLEX) 500 MG capsule Take 1 capsule (500 mg total) by mouth 2 (two) times daily. 10 capsule Belinda Fisher, PA-C     PDMP not reviewed this encounter.   Belinda Fisher, PA-C 12/14/19 1835

## 2019-12-14 NOTE — Discharge Instructions (Addendum)
Your urine was positive for an urinary tract infection. Start keflex as directed. Keep hydrated, urine should be clear to pale yellow in color. Monitor for any worsening of symptoms, fever, worsening abdominal pain, nausea/vomiting, flank pain, follow up for reevaluation.   No alarming signs to the hernia. Follow up with PCP about general surgery referral.

## 2019-12-18 LAB — URINE CULTURE: Culture: NO GROWTH

## 2019-12-28 ENCOUNTER — Encounter: Payer: Self-pay | Admitting: Family Medicine

## 2019-12-28 ENCOUNTER — Ambulatory Visit: Payer: Self-pay | Attending: Family Medicine | Admitting: Family Medicine

## 2019-12-28 ENCOUNTER — Other Ambulatory Visit: Payer: Self-pay

## 2019-12-28 VITALS — BP 102/65 | HR 66 | Ht 62.0 in | Wt 128.0 lb

## 2019-12-28 DIAGNOSIS — N3000 Acute cystitis without hematuria: Secondary | ICD-10-CM

## 2019-12-28 DIAGNOSIS — Z758 Other problems related to medical facilities and other health care: Secondary | ICD-10-CM

## 2019-12-28 DIAGNOSIS — Z789 Other specified health status: Secondary | ICD-10-CM

## 2019-12-28 DIAGNOSIS — Z8632 Personal history of gestational diabetes: Secondary | ICD-10-CM

## 2019-12-28 DIAGNOSIS — K429 Umbilical hernia without obstruction or gangrene: Secondary | ICD-10-CM

## 2019-12-28 DIAGNOSIS — K5900 Constipation, unspecified: Secondary | ICD-10-CM

## 2019-12-28 DIAGNOSIS — R1030 Lower abdominal pain, unspecified: Secondary | ICD-10-CM

## 2019-12-28 LAB — POCT URINALYSIS DIP (CLINITEK)
Bilirubin, UA: NEGATIVE
Blood, UA: NEGATIVE
Glucose, UA: NEGATIVE mg/dL
Ketones, POC UA: NEGATIVE mg/dL
Nitrite, UA: NEGATIVE
POC PROTEIN,UA: NEGATIVE
Spec Grav, UA: 1.025
Urobilinogen, UA: 0.2 U/dL
pH, UA: 6.5

## 2019-12-28 MED ORDER — SULFAMETHOXAZOLE-TRIMETHOPRIM 800-160 MG PO TABS: 1.0000 | ORAL_TABLET | Freq: Two times a day (BID) | ORAL | 0 refills | Status: AC

## 2019-12-28 NOTE — Progress Notes (Signed)
Established Patient Office Visit  Subjective:  Patient ID: Alexandra Hardy, female    DOB: 1991-09-21  Age: 29 y.o. MRN: [id number]  CC:  Chief Complaint  Patient presents with  . Hospitalization Follow-up   Due to language barrier, Stratus video interpretation system used at today's visit.  HPI Alexandra Hardy, 29 year old Hispanic female, who is status post urgent care visit on 12/14/2019 at [facility] due to complaints of dysuria and frequency of urination as well as complaint of umbilical hernia status post childbirth in May 2019 and patient interested in surgical repair of hernia.  Patient was treated with Keflex for urinary tract infection.  On review of chart, patient had been referred to general surgery regarding the umbilical hernia on 10/16/2019 through telemedicine visit with another provider in this practice.        She reports that she was never contacted regarding surgery appointment after her visit in November.  She continues to have issues with recurrent abdominal pain at the area of her bellybutton.  The pain comes and goes.  She has also had some issues with constipation which makes her bellybutton feel as if the skin is stretched out.  She denies any blood in the stool and no black stools.  She denies any nausea or vomiting.  She reports that her urinary frequency and dysuria have gone away since she was treated for urinary tract infection.  She still feels as if she has some dull lower abdominal discomfort.  She has history of gestational diabetes but no increased thirst or urinary frequency at this time.  Last hemoglobin A1c was normal.  She reports no possibility of current pregnancy.  Past Medical History:  Diagnosis Date  . Gestational diabetes   . History of HPV infection     Past Surgical History:  Procedure Laterality Date  . NO PAST SURGERIES      Family History  Problem Relation Age of Onset  . Diabetes Father     Social  History   Socioeconomic History  . Marital status: Single    Spouse name: Not on file  . Number of children: Not on file  . Years of education: Not on file  . Highest education level: Not on file  Occupational History  . Not on file  Tobacco Use  . Smoking status: Never Smoker  . Smokeless tobacco: Never Used  Substance and Sexual Activity  . Alcohol use: No  . Drug use: No  . Sexual activity: Not Currently    Birth control/protection: Condom  Other Topics Concern  . Not on file  Social History Narrative  . Not on file   Social Determinants of Health   Financial Resource Strain:   . Difficulty of Paying Living Expenses: Not on file  Food Insecurity:   . Worried About Programme researcher, broadcasting/film/video in the Last Year: Not on file  . Ran Out of Food in the Last Year: Not on file  Transportation Needs:   . Lack of Transportation (Medical): Not on file  . Lack of Transportation (Non-Medical): Not on file  Physical Activity:   . Days of Exercise per Week: Not on file  . Minutes of Exercise per Session: Not on file  Stress:   . Feeling of Stress : Not on file  Social Connections:   . Frequency of Communication with Friends and Family: Not on file  . Frequency of Social Gatherings with Friends and Family: Not on file  . Attends  Religious Services: Not on file  . Active Member of Clubs or Organizations: Not on file  . Attends Banker Meetings: Not on file  . Marital Status: Not on file  Intimate Partner Violence:   . Fear of Current or Ex-Partner: Not on file  . Emotionally Abused: Not on file  . Physically Abused: Not on file  . Sexually Abused: Not on file    Outpatient Medications Prior to Visit  Medication Sig Dispense Refill  . acetaminophen (TYLENOL) 325 MG tablet Take 650 mg by mouth every 6 (six) hours as needed for moderate pain or headache.    . albuterol (PROVENTIL HFA;VENTOLIN HFA) 108 (90 Base) MCG/ACT inhaler Inhale 2 puffs into the lungs every 6 (six)  hours as needed for wheezing or shortness of breath. Or cough (Patient not taking: Reported on 12/28/2019) 1 Inhaler 6  . cephALEXin (KEFLEX) 500 MG capsule Take 1 capsule (500 mg total) by mouth 2 (two) times daily. (Patient not taking: Reported on 12/28/2019) 10 capsule 0  . cetirizine (ZYRTEC) 10 MG tablet Take 1 tablet (10 mg total) by mouth at bedtime. For nasal congestion/postnasal drainage (Patient not taking: Reported on 12/28/2019) 30 tablet 2  . Vitamin D, Ergocalciferol, (DRISDOL) 50000 units CAPS capsule One capsule once per week; take after last breastfeeding of the day (Patient not taking: Reported on 12/28/2019) 4 capsule 6   No facility-administered medications prior to visit.    No Known Allergies  ROS Review of Systems  Constitutional: Positive for fatigue. Negative for chills and fever.  HENT: Negative for sore throat and trouble swallowing.   Respiratory: Negative for cough and shortness of breath.   Cardiovascular: Negative for chest pain and palpitations.  Gastrointestinal: Positive for abdominal pain. Negative for blood in stool, constipation, diarrhea and nausea.  Endocrine: Negative for polydipsia, polyphagia and polyuria.  Genitourinary: Negative for dysuria, flank pain and frequency.  Musculoskeletal: Negative for arthralgias and back pain.  Neurological: Negative for dizziness and light-headedness.  Hematological: Negative for adenopathy. Does not bruise/bleed easily.      Objective:    Physical Exam  Constitutional: She is oriented to person, place, and time. She appears well-developed and well-nourished.  Neck: No thyromegaly present.  Cardiovascular: Normal rate and regular rhythm.  Pulmonary/Chest: Effort normal and breath sounds normal.  Abdominal: Soft. There is abdominal tenderness. There is no rebound and no guarding.  Presence of soft, reducible, nontender umbilical hernia.  Patient with mild suprapubic discomfort to palpation.  Musculoskeletal:         General: No tenderness or edema.     Cervical back: Normal range of motion and neck supple.  Lymphadenopathy:    She has no cervical adenopathy.  Neurological: She is alert and oriented to person, place, and time.  Psychiatric: She has a normal mood and affect. Her behavior is normal.  Nursing note and vitals reviewed.   BP 102/65   Pulse 66   Ht 5\' 2"  (1.575 m)   Wt 128 lb (58.1 kg)   LMP 12/05/2019   SpO2 98%   BMI 23.41 kg/m  Wt Readings from Last 3 Encounters:  12/28/19 128 lb (58.1 kg)  10/16/19 120 lb (54.4 kg)  09/05/18 130 lb (59 kg)     Health Maintenance Due  Topic Date Due  . PNEUMOCOCCAL POLYSACCHARIDE VACCINE AGE 69-64 HIGH RISK  03/02/1991  . FOOT EXAM  03/02/1999  . OPHTHALMOLOGY EXAM  03/02/1999  . URINE MICROALBUMIN  03/02/1999  . PAP SMEAR-Modifier  03/11/2018  . HEMOGLOBIN A1C  02/13/2019  . INFLUENZA VACCINE  06/23/2019      Lab Results  Component Value Date   TSH 1.560 08/15/2018   Lab Results  Component Value Date   WBC 4.7 08/15/2018   HGB 13.7 08/15/2018   HCT 40.4 08/15/2018   MCV 95 08/15/2018   PLT 243 08/15/2018   Lab Results  Component Value Date   NA 137 04/20/2017   K 4.1 04/20/2017   CO2 25 04/20/2017   GLUCOSE 87 04/20/2017   BUN 9 04/20/2017   CREATININE 0.59 04/20/2017   BILITOT 0.6 04/20/2017   ALKPHOS 90 04/20/2017   AST 32 04/20/2017   ALT 22 04/20/2017   PROT 8.2 (H) 04/20/2017   ALBUMIN 4.5 04/20/2017   CALCIUM 8.8 (L) 04/20/2017   ANIONGAP 7 04/20/2017   No results found for: CHOL No results found for: HDL No results found for: LDLCALC No results found for: TRIG No results found for: [facility] Lab Results  Component Value Date   HGBA1C 4.6 08/15/2018      Assessment & Plan:  1. Lower abdominal pain Patient status post urgent care visit on review of chart at the end of January and was diagnosed with urinary tract infection.  We will repeat UA at today's visit to make sure that UTI has resolved and is  not contributing to current lower abdominal pain.  Patient also with complaint of constipation and will check TSH to look for thyroid disorder and handout/educational material provided in Spanish regarding constipation. - POCT URINALYSIS DIP (CLINITEK)  2. Umbilical hernia without obstruction and without gangrene New referral will be placed to general surgery regarding umbilical hernia.  Discussed with the patient that this likely will need to be reviewed.  As there is a risk of bowel becoming trapped within the hernia which would represent a medical emergency.  Handout on umbilical hernias provided in Spanish as part of after visit summary and new referral placed to surgery.  Patient is to call the office next week if she has not been contacted regarding her referral. - Ambulatory referral to General Surgery  3. History of gestational diabetes mellitus (GDM), not currently pregnant She has history of gestational diabetes and will repeat hemoglobin A1c at today's visit and notify patient if any further intervention is needed. - Hemoglobin A1c  4. Constipation, unspecified constipation type TSH in follow-up of constipation, increase water and fiber in the diet.  Handout on constipation in Spanish as part of AVS. - TSH 5. Acute cystitis without hematuria Presence of leukocytes on urinalysis.  Will treat with Septra DS x3 days.  Urine sent for culture and she will be notified if additional treatment is needed based on the culture results. -Urine culture -Bactrim DS twice per day for 3 days #6  6.. Language barrier affecting health care Stratus video interpretation system used at today's visit to help with language barrier  An After Visit Summary was printed and given to the patient.  Follow-up: Return for abdominal pain in 2-3 weeks if not feeling better; ED if acute worsening of abdomina pain.    Cain Saupe, MD

## 2019-12-28 NOTE — Patient Instructions (Signed)
Hernia umbilical en los adultos Umbilical Hernia, Adult  Una hernia es un bulto de tejido que sobresale a travs de una abertura [employer]. Una hernia umbilical se produce en el abdomen, cerca del ombligo. La hernia puede contener tejidos del intestino delgado, el intestino grueso o tejido graso que recubre el intestino (epipln). Las hernias [employer] en los adultos suelen empeorar con el tiempo y requieren tratamiento quirrgico. Hay varios tipos de hernias umbilicales. Es posible que tenga lo siguiente:  Una hernia ubicada justo por debajo o por arriba del ombligo (hernia indirecta). Es el tipo de hernia umbilical ms frecuente en los adultos.  Una hernia que se forma a travs de una abertura hecha por el ombligo (hernia directa).  Una hernia que aparece y desaparece (hernia reducible). Una hernia reducible puede ser visible solo al hacer fuerza, levantar objetos pesados o toser. Este tipo de hernia se puede reintroducir en el abdomen (reducir).  Una hernia que aprisiona tejido abdominal (hernia encarcelada). Este tipo de hernia es irreducible.  Una hernia que interrumpe el flujo de sangre a los tejidos en su interior (hernia estrangulada). Si esto ocurre, los tejidos Radio broadcast assistant a Pharmacologist. Este tipo de hernia requiere tratamiento de Freight forwarder. Cules son las causas? Una hernia umbilical se produce cuando el tejido dentro del abdomen ejerce presin sobre una zona debilitada de los msculos abdominales. Qu incrementa el riesgo? Puede correr un mayor riesgo de Best boy esta afeccin en los siguientes casos:  Tiene obesidad.  Tuvo varios embarazos.  Tiene una acumulacin de lquido dentro del abdomen (ascitis).  Se someti a una ciruga que [employer] abdominales. Cules son los signos o sntomas? El principal sntoma de esta afeccin es un bulto en el ombligo o cerca de este que no causa dolor. Una hernia reducible puede ser visible solo al hacer fuerza, levantar  objetos pesados o toser. Otros sntomas pueden incluir lo siguiente:  Dolor sordo.  Sensacin de opresin. Los sntomas de una hernia estrangulada pueden incluir los siguientes:  Dolor que se vuelve cada vez ms intenso.  Nuseas y vmitos.  Dolor al ejercer presin sobre la hernia.  Cambio de color de la piel que recubre la hernia que se torna roja o violcea.  Estreimiento.  Sangre en las heces. Cmo se diagnostica? Esta afeccin se puede diagnosticar en funcin de lo siguiente:  Un examen fsico. Pueden pedirle que tosa o que haga fuerza mientras est de pie. Estas acciones aumentan la presin dentro del abdomen y empujan la hernia a travs de la abertura en los msculos. El mdico puede ejercer presin sobre la hernia para tratar de reducirla.  Los sntomas y los antecedentes mdicos. Cmo se trata? La ciruga es el nico tratamiento para una hernia umbilical. En el caso de que la hernia est estrangulada, esta se realiza lo antes posible. Si tiene una hernia pequea que no est encarcelada, tal vez tenga que bajar de peso antes de la [place]. Siga estas indicaciones en su casa:  Baje de peso, si se lo indic el mdico.  No trate de reintroducir la hernia.  Observe si la hernia cambia de color o de tamao. Infrmele al mdico si se producen cambios.  Tal vez deba evitar las actividades que aumentan la presin sobre la hernia.  No levante objetos que pesen ms de 10libras (4.5kg) hasta que el mdico le diga que es seguro.  Tome los medicamentos de venta libre y los recetados solamente como se lo haya indicado el mdico.  [place] a  todas las visitas de control como se lo haya indicado el mdico. Esto es importante. Comunquese con un mdico si:  La hernia se agranda.  La hernia le causa dolor. Solicite ayuda de inmediato si:  Tiene un dolor intenso y repentino cerca de la zona de la hernia.  Tiene dolor, as como nuseas o vmitos.  Tiene dolor y la piel  que recubre la hernia cambia de color.  Presenta fiebre. Esta informacin no tiene Theme park manager el consejo del mdico. Asegrese de hacerle al mdico cualquier pregunta que tenga. Document Revised: 02/20/2018 Document Reviewed: 08/15/2017 Elsevier Patient Education  2020 Elsevier Inc.  Estreimiento en los adultos Constipation, Adult Se llama estreimiento cuando:  Tiene deposiciones (defeca) una menor cantidad de veces a la semana de lo normal.  Tiene dificultad para defecar.  Las heces son secas y duras o son ms grandes que lo normal. Siga estas indicaciones en su casa: Comida y bebida   Consuma alimentos con alto contenido de [place], por ejemplo: ? Nils Pyle y verduras frescas. ? Cereales integrales. ? Frijoles.  Consuma una menor cantidad de alimentos ricos en grasas, con bajo contenido de [place] o excesivamente procesados, como: ? Papas fritas. ? Hamburguesas. ? Galletas. ? Caramelos. ? Gaseosas.  Beba suficiente lquido para mantener el pis (orina) claro o de color amarillo plido. Instrucciones generales  Haga actividad fsica con regularidad o segn las indicaciones del mdico.  Vaya al bao cuando sienta la necesidad de defecar. No se aguante las ganas.  Tome los medicamentos de venta libre y los recetados solamente como se lo haya indicado el mdico. Estos incluyen los suplementos de [place].  Realice ejercicios de reentrenamiento del suelo plvico, como: ? Respirar profundamente mientras relaja la parte inferior del vientre (abdomen). ? Relajar el suelo plvico mientras defeca.  Controle su afeccin para ver si hay cambios.  Concurra a todas las visitas de control como se lo haya indicado el mdico. Esto es importante. Comunquese con un mdico si:  Siente un dolor que empeora.  Tiene fiebre.  No ha defecado por 4das.  Vomita.  No tiene hambre.  Pierde peso.  Tiene una hemorragia en el ano.  Las deposiciones Charity fundraiser) son delgadas como un  lpiz. Solicite ayuda de inmediato si:  Lance Muss, y los sntomas empeoran de repente.  Tiene prdida de materia fecal u observa [employer].  Siente el vientre ms duro o ms grande de lo normal (est hinchado).  Siente un dolor muy intenso en el vientre.  Se siente mareado o se desmaya. Esta informacin no tiene Theme park manager el consejo del mdico. Asegrese de hacerle al mdico cualquier pregunta que tenga. Document Revised: 02/09/2017 Document Reviewed: 04/28/2016 Elsevier Patient Education  2020 [employer].

## 2019-12-29 LAB — HEMOGLOBIN A1C
Est. average glucose Bld gHb Est-mCnc: 105 mg/dL
Hgb A1c MFr Bld: 5.3 % (ref 4.8–5.6)

## 2019-12-29 LAB — TSH: TSH: 1.18 u[IU]/mL (ref 0.450–4.500)

## 2019-12-30 LAB — URINE CULTURE: Organism ID, Bacteria: NO GROWTH

## 2020-01-28 ENCOUNTER — Other Ambulatory Visit: Payer: Self-pay

## 2020-01-28 ENCOUNTER — Ambulatory Visit: Payer: Self-pay | Attending: Family Medicine

## 2020-04-03 ENCOUNTER — Ambulatory Visit: Payer: Self-pay | Admitting: Family

## 2020-04-04 ENCOUNTER — Encounter: Payer: Self-pay | Admitting: Emergency Medicine

## 2020-04-04 ENCOUNTER — Ambulatory Visit
Admission: EM | Admit: 2020-04-04 | Discharge: 2020-04-04 | Disposition: A | Discharge status: Home / Self Care | Payer: Self-pay

## 2020-04-04 ENCOUNTER — Other Ambulatory Visit: Payer: Self-pay

## 2020-04-04 DIAGNOSIS — J4521 Mild intermittent asthma with (acute) exacerbation: Secondary | ICD-10-CM

## 2020-04-04 MED ORDER — FLUTICASONE PROPIONATE 50 MCG/ACT NA SUSP: 2.0000 | Freq: Every day | NASAL | 0 refills | Status: DC

## 2020-04-04 MED ORDER — PREDNISONE 50 MG PO TABS: 50.0000 mg | ORAL_TABLET | Freq: Every day | ORAL | 0 refills | Status: DC

## 2020-04-04 NOTE — ED Provider Notes (Signed)
Alexandra Hardy    CSN: [id number] Arrival date & time: 04/04/20  1630      History   Chief Complaint Chief Complaint  Patient presents with  . Cough    HPI Alexandra Hardy is a 29 y.o. female.   29 year old female comes in for 1 moth history of URI symptoms. HPI obtained by patient through video translator. Has had nonproductive cough, rhinorrhea. Denies fever, chills, body aches. Denies abdominal pain, nausea, vomiting, diarrhea. Denies shortness of breath, loss of taste/smell. Does have wheezing, coughing more at night. Denies chest tightness. Has not used albuterol for symptoms. otc cold medicine without relief. Never smoker. Had COVID last year.      Past Medical History:  Diagnosis Date  . Gestational diabetes   . History of HPV infection     Patient Active Problem List   Diagnosis Date Noted  . Normal labor 04/12/2018  . SVD (spontaneous vaginal delivery) 04/12/2018  . History of gestational diabetes in prior pregnancy, currently pregnant 11/02/2017  . Supervision of high risk pregnancy, antepartum, first trimester 10/05/2017  . Pap smear of cervix shows high risk HPV present 05/14/2015  . Language barrier affecting health Hardy 03/12/2015    Past Surgical History:  Procedure Laterality Date  . NO PAST SURGERIES      OB History    Gravida  2   Para  2   Term  2   Preterm      AB      Living  2     SAB      TAB      Ectopic      Multiple  0   Live Births  2            Home Medications    Prior to Admission medications   Medication Sig Start Date End Date Taking? Authorizing Provider  fexofenadine (ALLEGRA) 180 MG tablet Take 180 mg by mouth daily.   Yes [provider]  acetaminophen (TYLENOL) 325 MG tablet Take 650 mg by mouth every 6 (six) hours as needed for moderate pain or headache.    [provider]  albuterol (PROVENTIL HFA;VENTOLIN HFA) 108 (90 Base) MCG/ACT inhaler Inhale 2 puffs into the  lungs every 6 (six) hours as needed for wheezing or shortness of breath. Or cough Patient not taking: Reported on 12/28/2019 09/05/18   Fulp, Cammie, MD  fluticasone (FLONASE) 50 MCG/ACT nasal spray Place 2 sprays into both nostrils daily. 04/04/20   Ok Edwards, PA-C  predniSONE (DELTASONE) 50 MG tablet Take 1 tablet (50 mg total) by mouth daily with breakfast. 04/04/20   Tasia Catchings, Jaivian Battaglini V, PA-C  Vitamin D, Ergocalciferol, (DRISDOL) 50000 units CAPS capsule One capsule once per week; take after last breastfeeding of the day Patient not taking: Reported on 12/28/2019 09/05/18   Fulp, Ander Gaster, MD  cetirizine (ZYRTEC) 10 MG tablet Take 1 tablet (10 mg total) by mouth at bedtime. For nasal congestion/postnasal drainage Patient not taking: Reported on 12/28/2019 08/15/18 04/04/20  Antony Blackbird, MD    Family History Family History  Problem Relation Age of Onset  . Diabetes Father     Social History Social History   Tobacco Use  . Smoking status: Never Smoker  . Smokeless tobacco: Never Used  Substance Use Topics  . Alcohol use: No  . Drug use: No     Allergies   Patient has no known allergies.   Review of Systems Review of Systems  Reason unable to perform ROS: See HPI as above.     Physical Exam Triage Vital Signs ED Triage Vitals  Enc Vitals Group     BP 04/04/20 1637 116/74     Pulse Rate 04/04/20 1637 70     Resp 04/04/20 1637 (!) 22     Temp 04/04/20 1637 98.2 F (36.8 C)     Temp Source 04/04/20 1637 Oral     SpO2 04/04/20 1637 98 %     Weight --      Height --      Head Circumference --      Peak Flow --      Pain Score 04/04/20 1640 0     Pain Loc --      Pain Edu? --      Excl. in GC? --    No data found.  Updated Vital Signs BP 116/74 (BP Location: Left Arm)   Pulse 70   Temp 98.2 F (36.8 C) (Oral)   Resp (!) 22   SpO2 98%   Physical Exam Constitutional:      General: She is not in acute distress.    Appearance: Normal appearance. She is not ill-appearing,  toxic-appearing or diaphoretic.  HENT:     Head: Normocephalic and atraumatic.     Mouth/Throat:     Mouth: Mucous membranes are moist.     Pharynx: Oropharynx is clear. Uvula midline.  Cardiovascular:     Rate and Rhythm: Normal rate and regular rhythm.     Heart sounds: Normal heart sounds. No murmur. No friction rub. No gallop.   Pulmonary:     Effort: Pulmonary effort is normal. No accessory muscle usage, prolonged expiration, respiratory distress or retractions.     Comments: Intermittent end inspiration wheezing. Good air movement. No rhonchi, rales. Musculoskeletal:     Cervical back: Normal range of motion and neck supple.  Neurological:     General: No focal deficit present.     Mental Status: She is alert and oriented to person, place, and time.      UC Treatments / Results  Labs (all labs ordered are listed, but only abnormal results are displayed) Labs Reviewed - No data to display  EKG   Radiology No results found.  Procedures Procedures (including critical Hardy time)  Medications Ordered in UC Medications - No data to display  Initial Impression / Assessment and Plan / UC Course  I have reviewed the triage vital signs and the nursing notes.  Pertinent labs & imaging results that were available during my Hardy of the patient were reviewed by me and considered in my medical decision making (see chart for details).    Will treat for reactive airway with prednisone. To start albuterol as needed. Other symptomatic treatment discussed. Return precautions given. Patient expresses understanding and agrees to plan.  Final Clinical Impressions(s) / UC Diagnoses   Final diagnoses:  Mild intermittent reactive airway disease with acute exacerbation    ED Prescriptions    Medication Sig Dispense Auth. Provider   predniSONE (DELTASONE) 50 MG tablet Take 1 tablet (50 mg total) by mouth daily with breakfast. 5 tablet Haruki Arnold V, PA-C   fluticasone (FLONASE) 50  MCG/ACT nasal spray Place 2 sprays into both nostrils daily. 1 g Belinda Fisher, PA-C     PDMP not reviewed this encounter.   Belinda Fisher, PA-C 04/04/20 1709

## 2020-04-04 NOTE — Discharge Instructions (Signed)
Prednisone as directed for cough/asthma. Albuterol as needed, and before bedtime. Continue allegra, add on flonase for symptoms. Follow up with PCP for further evaluation if symptoms not improving. Monitor for any worsening of symptoms, chest pain, shortness of breath, wheezing, swelling of the throat, go to the emergency department for further evaluation needed.

## 2020-04-04 NOTE — ED Triage Notes (Signed)
Pt here for non productive cough x 1 month; pt sts some allergy sx as well; denies fever; pt sts she had covid last year and doesn't believe this is covid

## 2020-04-25 ENCOUNTER — Ambulatory Visit: Payer: Self-pay | Admitting: Family Medicine

## 2020-05-07 NOTE — Progress Notes (Signed)
Patient ID: Alexandra Hardy, female   DOB: 10-Feb-1991, 29 y.o.   MRN: [id number]   Virtual Visit via Telephone Note  I connected with Alexandra Hardy on 05/08/20 at  8:50 AM EDT by telephone and verified that I am speaking with the correct person using two identifiers.   I discussed the limitations, risks, security and privacy concerns of performing an evaluation and management service by telephone and the availability of in person appointments. I also discussed with the patient that there may be a patient responsible charge related to this service. The patient expressed understanding and agreed to proceed.  PATIENT visit by telephone virtually in the context of Covid-19 pandemic. Patient location:  home My Location:  [facility] office Persons on the call:  Me, the patient, and interpreter ([name])   History of Present Illness: Seen at [facility] for cough 04/04/2020 and treated with prednisone for cough. Finished prednisone.   Doesn't take allergy meds every day.  Still has some "coughing fits" when she exercises.  She says these coughs are mild compared to what she had previously.  No fever.  Cough is dry and not productive.  Appetite is good.    Also needs referral for general surgery for umbilical hernia   Observations/Objective:  NAD.  A&Ox3   Assessment and Plan: 1. Cough Take allergy meds daily.  Use inhaler prior to exercise.    2. Language barrier affecting health care pacific interpreters used and additional time performing visit was required.   3. Follow up after UC appt  4. Umbilical hernia -refer to general surgery  Follow Up Instructions: See PCP 2-3 months   I discussed the assessment and treatment plan with the patient. The patient was provided an opportunity to ask questions and all were answered. The patient agreed with the plan and demonstrated an understanding of the instructions.   The patient was advised to call back or seek an in-person evaluation if the  symptoms worsen or if the condition fails to improve as anticipated.  I provided 20 minutes of non-face-to-face time during this encounter.   Georgian Co, PA-C

## 2020-05-08 ENCOUNTER — Other Ambulatory Visit: Payer: Self-pay

## 2020-05-08 ENCOUNTER — Ambulatory Visit: Payer: Self-pay | Attending: Family Medicine | Admitting: Physician Assistant

## 2020-05-08 DIAGNOSIS — Z09 Encounter for follow-up examination after completed treatment for conditions other than malignant neoplasm: Secondary | ICD-10-CM

## 2020-05-08 DIAGNOSIS — Z603 Acculturation difficulty: Secondary | ICD-10-CM

## 2020-05-08 DIAGNOSIS — R059 Cough, unspecified: Secondary | ICD-10-CM

## 2020-05-08 DIAGNOSIS — K429 Umbilical hernia without obstruction or gangrene: Secondary | ICD-10-CM

## 2020-05-08 DIAGNOSIS — Z789 Other specified health status: Secondary | ICD-10-CM

## 2020-05-08 DIAGNOSIS — R05 Cough: Secondary | ICD-10-CM

## 2020-05-19 ENCOUNTER — Encounter: Payer: Self-pay | Admitting: Emergency Medicine

## 2020-05-19 ENCOUNTER — Encounter: Payer: Self-pay | Admitting: Surgery

## 2020-05-19 ENCOUNTER — Ambulatory Visit (INDEPENDENT_AMBULATORY_CARE_PROVIDER_SITE_OTHER): Payer: Self-pay | Admitting: Surgery

## 2020-05-19 ENCOUNTER — Other Ambulatory Visit: Payer: Self-pay

## 2020-05-19 VITALS — BP 113/78 | HR 90 | Temp 97.7°F | Resp 12 | Wt 128.8 lb

## 2020-05-19 DIAGNOSIS — K429 Umbilical hernia without obstruction or gangrene: Secondary | ICD-10-CM

## 2020-05-19 NOTE — Progress Notes (Signed)
05/19/2020  Reason for Visit:  Umbilical hernia  Referring Provider:  Georgian Co, PA-C  History of Present Illness: Alexandra Hardy is a 29 y.o. female presenting for evaluation of an umbilical hernia.  Patient reports she first noticed this after her last pregnancy, about 2 years ago.  She reports that over time, it has gotten bigger.  She reports bulging sensation from time to time, and sometimes it will get harder and more tender.  She does not try to reduce it or push it in.  She recently went to the ED with coughing, and she felt that coughing would make it hurt more.  She has not been doing any significant exercise lately because of the hernia.  Denies any fevers, chills, chest pain, shortness of breath.  Reports occasional nausea, but is unsure if it's related.  Denies any other areas of abdominal pain, but does endorse constipation.  Has not had any surgeries in the past.  Past Medical History: Past Medical History:  Diagnosis Date  . Gestational diabetes   . History of HPV infection      Past Surgical History: Past Surgical History:  Procedure Laterality Date  . NO PAST SURGERIES      Home Medications: Prior to Admission medications   Medication Sig Start Date End Date Taking? Authorizing Provider  acetaminophen (TYLENOL) 325 MG tablet Take 650 mg by mouth every 6 (six) hours as needed for moderate pain or headache.   Yes [provider]  albuterol (PROVENTIL HFA;VENTOLIN HFA) 108 (90 Base) MCG/ACT inhaler Inhale 2 puffs into the lungs every 6 (six) hours as needed for wheezing or shortness of breath. Or cough 09/05/18  Yes Fulp, Cammie, MD  fexofenadine (ALLEGRA) 180 MG tablet Take 180 mg by mouth daily.   Yes [provider]  fluticasone (FLONASE) 50 MCG/ACT nasal spray Place 2 sprays into both nostrils daily. 04/04/20  Yes Yu, Amy V, PA-C  Vitamin D, Ergocalciferol, (DRISDOL) 50000 units CAPS capsule One capsule once per week; take after last  breastfeeding of the day 09/05/18  Yes Fulp, Cammie, MD  cetirizine (ZYRTEC) 10 MG tablet Take 1 tablet (10 mg total) by mouth at bedtime. For nasal congestion/postnasal drainage Patient not taking: Reported on 12/28/2019 08/15/18 04/04/20  Cain Saupe, MD    Allergies: No Known Allergies  Social History:  reports that she has never smoked. She has never used smokeless tobacco. She reports that she does not drink alcohol and does not use drugs.   Family History: Family History  Problem Relation Age of Onset  . Diabetes Father     Review of Systems: Review of Systems  Constitutional: Negative for chills and fever.  HENT: Negative for hearing loss.   Respiratory: Negative for shortness of breath.   Cardiovascular: Negative for chest pain.  Gastrointestinal: Positive for abdominal pain, constipation and nausea. Negative for diarrhea and vomiting.  Genitourinary: Negative for dysuria.  Musculoskeletal: Negative for myalgias.  Skin: Negative for rash.  Neurological: Negative for dizziness.  Psychiatric/Behavioral: Negative for depression.    Physical Exam BP 113/78   Pulse 90   Temp 97.7 F (36.5 C)   Resp 12   Wt 128 lb 12.8 oz (58.4 kg)   SpO2 98%   BMI 23.56 kg/m  CONSTITUTIONAL: No acute distress HEENT:  Normocephalic, atraumatic, extraocular motion intact. NECK: Trachea is midline, and there is no jugular venous distension.  RESPIRATORY:  Lungs are clear, and breath sounds are equal bilaterally. Normal respiratory effort without pathologic use  of accessory muscles. CARDIOVASCULAR: Heart is regular without murmurs, gallops, or rubs. GI: The abdomen is soft, non-distended, non-tender to palpation.  She does have an umbilical hernia, about 2 cm in size, which appears to contain fat, which is only partially reducible, causing some discomfort when pushing on it.  MUSCULOSKELETAL:  Normal muscle strength and tone in all four extremities.  No peripheral edema or cyanosis. SKIN:  Skin turgor is normal. There are no pathologic skin lesions.  NEUROLOGIC:  Motor and sensation is grossly normal.  Cranial nerves are grossly intact. PSYCH:  Alert and oriented to person, place and time. Affect is normal.  Laboratory Analysis: No results found for this or any previous visit (from the past 24 hour(s)).  Imaging: No results found.  Assessment and Plan: This is a 29 y.o. female with an umbilical hernia.  --Discussed with the patient that this is likely a fat containing umbilical hernia.  I am able to partially reduce it, but I think after two years of having the hernia, there's some degree of scarring that's making it partially non-reducible.  No evidence of strangulation at this point, but the patient does report that sometimes it becomes hard and more tender. --Given the findings, I recommended that we proceed with surgical repair of her hernia.  Given the smaller size, I think it's appropriate to proceed with an open umbilical hernia repair, likely with mesh.  Discussed with her the risks of bleeding, infection, injury to surrounding structures.  Discussed with her the post-op recovery, activity restrictions for 4 weeks.  She's willing to proceed. --Will schedule her for 05/27/20.  She understands that she would get a COVID test prior to surgery.  Face-to-face time spent with the patient and care providers was 25 minutes, with more than 50% of the time spent counseling, educating, and coordinating care of the patient.     Melvyn Neth, [facility]

## 2020-05-19 NOTE — H&P (View-Only) (Signed)
05/19/2020  Reason for Visit:  Umbilical hernia  Referring Provider:  Georgian Co, PA-C  History of Present Illness: Alexandra Hardy is a 29 y.o. female presenting for evaluation of an umbilical hernia.  Patient reports she first noticed this after her last pregnancy, about 2 years ago.  She reports that over time, it has gotten bigger.  She reports bulging sensation from time to time, and sometimes it will get harder and more tender.  She does not try to reduce it or push it in.  She recently went to the ED with coughing, and she felt that coughing would make it hurt more.  She has not been doing any significant exercise lately because of the hernia.  Denies any fevers, chills, chest pain, shortness of breath.  Reports occasional nausea, but is unsure if it's related.  Denies any other areas of abdominal pain, but does endorse constipation.  Has not had any surgeries in the past.  Past Medical History: Past Medical History:  Diagnosis Date  . Gestational diabetes   . History of HPV infection      Past Surgical History: Past Surgical History:  Procedure Laterality Date  . NO PAST SURGERIES      Home Medications: Prior to Admission medications   Medication Sig Start Date End Date Taking? Authorizing Provider  acetaminophen (TYLENOL) 325 MG tablet Take 650 mg by mouth every 6 (six) hours as needed for moderate pain or headache.   Yes [provider]  albuterol (PROVENTIL HFA;VENTOLIN HFA) 108 (90 Base) MCG/ACT inhaler Inhale 2 puffs into the lungs every 6 (six) hours as needed for wheezing or shortness of breath. Or cough 09/05/18  Yes Fulp, Cammie, MD  fexofenadine (ALLEGRA) 180 MG tablet Take 180 mg by mouth daily.   Yes [provider]  fluticasone (FLONASE) 50 MCG/ACT nasal spray Place 2 sprays into both nostrils daily. 04/04/20  Yes Yu, Amy V, PA-C  Vitamin D, Ergocalciferol, (DRISDOL) 50000 units CAPS capsule One capsule once per week; take after last  breastfeeding of the day 09/05/18  Yes Fulp, Cammie, MD  cetirizine (ZYRTEC) 10 MG tablet Take 1 tablet (10 mg total) by mouth at bedtime. For nasal congestion/postnasal drainage Patient not taking: Reported on 12/28/2019 08/15/18 04/04/20  Cain Saupe, MD    Allergies: No Known Allergies  Social History:  reports that she has never smoked. She has never used smokeless tobacco. She reports that she does not drink alcohol and does not use drugs.   Family History: Family History  Problem Relation Age of Onset  . Diabetes Father     Review of Systems: Review of Systems  Constitutional: Negative for chills and fever.  HENT: Negative for hearing loss.   Respiratory: Negative for shortness of breath.   Cardiovascular: Negative for chest pain.  Gastrointestinal: Positive for abdominal pain, constipation and nausea. Negative for diarrhea and vomiting.  Genitourinary: Negative for dysuria.  Musculoskeletal: Negative for myalgias.  Skin: Negative for rash.  Neurological: Negative for dizziness.  Psychiatric/Behavioral: Negative for depression.    Physical Exam BP 113/78   Pulse 90   Temp 97.7 F (36.5 C)   Resp 12   Wt 128 lb 12.8 oz (58.4 kg)   SpO2 98%   BMI 23.56 kg/m  CONSTITUTIONAL: No acute distress HEENT:  Normocephalic, atraumatic, extraocular motion intact. NECK: Trachea is midline, and there is no jugular venous distension.  RESPIRATORY:  Lungs are clear, and breath sounds are equal bilaterally. Normal respiratory effort without pathologic use  of accessory muscles. CARDIOVASCULAR: Heart is regular without murmurs, gallops, or rubs. GI: The abdomen is soft, non-distended, non-tender to palpation.  She does have an umbilical hernia, about 2 cm in size, which appears to contain fat, which is only partially reducible, causing some discomfort when pushing on it.  MUSCULOSKELETAL:  Normal muscle strength and tone in all four extremities.  No peripheral edema or cyanosis. SKIN:  Skin turgor is normal. There are no pathologic skin lesions.  NEUROLOGIC:  Motor and sensation is grossly normal.  Cranial nerves are grossly intact. PSYCH:  Alert and oriented to person, place and time. Affect is normal.  Laboratory Analysis: No results found for this or any previous visit (from the past 24 hour(s)).  Imaging: No results found.  Assessment and Plan: This is a 29 y.o. female with an umbilical hernia.  --Discussed with the patient that this is likely a fat containing umbilical hernia.  I am able to partially reduce it, but I think after two years of having the hernia, there's some degree of scarring that's making it partially non-reducible.  No evidence of strangulation at this point, but the patient does report that sometimes it becomes hard and more tender. --Given the findings, I recommended that we proceed with surgical repair of her hernia.  Given the smaller size, I think it's appropriate to proceed with an open umbilical hernia repair, likely with mesh.  Discussed with her the risks of bleeding, infection, injury to surrounding structures.  Discussed with her the post-op recovery, activity restrictions for 4 weeks.  She's willing to proceed. --Will schedule her for 05/27/20.  She understands that she would get a COVID test prior to surgery.  Face-to-face time spent with the patient and care providers was 25 minutes, with more than 50% of the time spent counseling, educating, and coordinating care of the patient.     Masahiro Iglesia Luis Winson Eichorn, MD [facility]   

## 2020-05-19 NOTE — Patient Instructions (Addendum)
Our surgery scheduler will contact you to schedule your surgery. Please have the pink sheet available when she calls you.  Please call the office if you have any questions or concerns.   Nuestro programador de Financial risk analyst se Audiological scientist con usted para Charity fundraiser su Leisure centre manager. Tenga a mano la hoja rosada cuando lo llame. Llame a la oficina si tiene alguna pregunta o inquietud.  Hernia umbilical en los adultos Umbilical Hernia, Adult  Una hernia es un bulto de tejido que sobresale a travs de una abertura [employer]. Una hernia umbilical se produce en el abdomen, cerca del ombligo. La hernia puede contener tejidos del intestino delgado, el intestino grueso o tejido graso que recubre el intestino (epipln). Las hernias USAA en los adultos suelen empeorar con el tiempo y requieren tratamiento quirrgico. Hay varios tipos de hernias umbilicales. Es posible que tenga lo siguiente:  Una hernia ubicada justo por debajo o por arriba del ombligo (hernia indirecta). Es el tipo de hernia umbilical ms frecuente en los adultos.  Una hernia que se forma a travs de una abertura hecha por el ombligo (hernia directa).  Una hernia que aparece y desaparece (hernia reducible). Una hernia reducible puede ser visible solo al hacer fuerza, levantar objetos pesados o toser. Este tipo de hernia se puede reintroducir en el abdomen (reducir).  Una hernia que aprisiona tejido abdominal (hernia encarcelada). Este tipo de hernia es irreducible.  Una hernia que interrumpe el flujo de sangre a los tejidos en su interior (hernia estrangulada). Si esto ocurre, los tejidos Mining engineer a Furniture conservator/restorer. Este tipo de hernia requiere tratamiento de Associate Professor. Cules son las causas? Una hernia umbilical se produce cuando el tejido dentro del abdomen ejerce presin sobre una zona debilitada de los msculos abdominales. Qu incrementa el riesgo? Puede correr un mayor riesgo de Warehouse manager esta afeccin en los siguientes  casos:  Tiene obesidad.  Tuvo varios embarazos.  Tiene una acumulacin de lquido dentro del abdomen (ascitis).  Se someti a una ciruga que [employer] abdominales. Cules son los signos o sntomas? El principal sntoma de esta afeccin es un bulto en el ombligo o cerca de este que no causa dolor. Una hernia reducible puede ser visible solo al hacer fuerza, levantar objetos pesados o toser. Otros sntomas pueden incluir lo siguiente:  Dolor sordo.  Sensacin de opresin. Los sntomas de una hernia estrangulada pueden incluir los siguientes:  Dolor que se vuelve cada vez ms intenso.  Nuseas y vmitos.  Dolor al ejercer presin sobre la hernia.  Cambio de color de la piel que recubre la hernia que se torna roja o violcea.  Estreimiento.  Sangre en las heces. Cmo se diagnostica? Esta afeccin se puede diagnosticar en funcin de lo siguiente:  Un examen fsico. Pueden pedirle que tosa o que haga fuerza mientras est de pie. Estas acciones aumentan la presin dentro del abdomen y empujan la hernia a travs de la abertura en los msculos. El mdico puede ejercer presin sobre la hernia para tratar de reducirla.  Los sntomas y los antecedentes mdicos. Cmo se trata? La ciruga es el nico tratamiento para una hernia umbilical. En el caso de que la hernia est estrangulada, esta se realiza lo antes posible. Si tiene una hernia pequea que no est encarcelada, tal vez tenga que bajar de peso antes de la [place]. Siga estas indicaciones en su casa:  Baje de peso, si se lo indic el mdico.  No trate de reintroducir la hernia.  Observe si la hernia cambia  de color o de tamao. Infrmele al mdico si se producen cambios.  Tal vez deba evitar las actividades que aumentan la presin sobre la hernia.  No levante objetos que pesen ms de 10libras (4.5kg) hasta que el mdico le diga que es seguro.  Tome los medicamentos de venta libre y los recetados solamente  como se lo haya indicado el mdico.  Consulting civil engineer a todas las visitas de control como se lo haya indicado el mdico. Esto es importante. Comunquese con un mdico si:  La hernia se agranda.  La hernia le causa dolor. Solicite ayuda de inmediato si:  Tiene un dolor intenso y repentino cerca de la zona de la hernia.  Tiene dolor, as como nuseas o vmitos.  Tiene dolor y la piel que recubre la hernia cambia de color.  Presenta fiebre. Esta informacin no tiene Marine scientist el consejo del mdico. Asegrese de hacerle al mdico cualquier pregunta que tenga. Document Revised: 02/20/2018 Document Reviewed: 08/15/2017 Elsevier Patient Education  [place].

## 2020-05-20 ENCOUNTER — Telehealth: Payer: Self-pay | Admitting: Surgery

## 2020-05-20 NOTE — Telephone Encounter (Signed)
Outgoing call is made, spoke with husband, Lynne Logan.  He is advised of Pre-Admission date/time, COVID Testing date and Surgery date.  Surgery Date: 05/27/20 Preadmission Testing Date: 05/22/20 (phone 8a-1p) Covid Testing Date: 05/23/20 - patient [facility] [facility]) between 8a-1p  He is also reminded to to call [phone], between 1-3:00pm the day before surgery, to find out what time to arrive for surgery.

## 2020-05-22 ENCOUNTER — Other Ambulatory Visit: Payer: Self-pay

## 2020-05-22 ENCOUNTER — Other Ambulatory Visit
Admission: RE | Admit: 2020-05-22 | Discharge: 2020-05-22 | Disposition: A | Discharge status: Home / Self Care | Payer: Self-pay | Source: Ambulatory Visit | Attending: Surgery | Admitting: Surgery

## 2020-05-22 DIAGNOSIS — Z01812 Encounter for preprocedural laboratory examination: Secondary | ICD-10-CM | POA: Insufficient documentation

## 2020-05-22 DIAGNOSIS — Z20822 Contact with and (suspected) exposure to covid-19: Secondary | ICD-10-CM | POA: Insufficient documentation

## 2020-05-22 NOTE — Patient Instructions (Signed)
Your procedure is scheduled on: Tuesday May 27, 2020. Su procedimiento est programado para: Martes 6 de [place] del 2021. Report to Day Surgery inside [facility] [place] [place] 2nd floor. Presntese a: Water engineer 2do piso.  To find out your arrival time please call [phone] between 1PM - 3PM on Friday May 23, 2020. Para saber su hora de llegada por favor llame al [phone] Eusebio Me la 1PM - 3PM el da: Viernes 2 de [place] del 2021.  Remember: Instructions that are not followed completely may result in serious medical risk, up to and including death,  or upon the discretion of your surgeon and anesthesiologist your surgery may need to be rescheduled.  Recuerde: Las instrucciones que no se siguen completamente Armed forces logistics/support/administrative officer en un riesgo de salud grave, incluyendo hasta  la [place] o a discrecin de su cirujano y Scientific laboratory technician, su ciruga se puede posponer.   __X_ 1.Do not eat food after midnight the night before your procedure. No    gum chewing or hard candies. You may drink clear liquids up to 2 hours     before you are scheduled to arrive for your surgery- DO not drink clear     Liquids within 2 hours of the start of your surgery.     Clear Liquids include:    water, apple juice without pulp, clear carbohydrate drink such as    Clearfast of Gartorade, Black Coffee or Tea (Do not add anything to coffee or tea).      No coma nada despus de la medianoche de la noche anterior a su    procedimiento. No coma chicles ni caramelos duros. Puede tomar    lquidos claros hasta 2 horas antes de su hora programada de llegada al     hospital para su procedimiento. No tome lquidos claros durante el     transcurso de las 2 horas de su llegada programada al hospital para su     procedimiento, ya que esto puede llevar a que su procedimiento se    retrase o tenga que volver a Magazine features editor.  Los lquidos claros incluyen:          - Agua o jugo de [place] sin pulpa          -  Bebidas claras con carbohidratos como ClearFast o Gatorade          - Caf negro o t claro (sin leche, sin cremas, no agregue nada al caf ni al t)  No tome nada que no est en esta lista.  Los pacientes con diabetes tipo 1 y tipo 2 solo deben Printmaker.  Llame a la clnica de PreCare o a la unidad de Same Day Surgery si  tiene alguna pregunta sobre estas instrucciones.  __X__2.  On the morning of surgery brush your teeth with toothpaste and water, you                may rinse your mouth with mouthwash if you wish.  Do not swallow any toothpaste of mouthwash.  En la maana de la [place], cepllese los dientes con pasta de dientes y [place],                [place] enjuagarse la boca con enjuague bucal si lo desea. No ingiera ninguna pasta de dientes o enjuague bucal.              _X__ 3.Do Not Smoke or use e-cigarettes For 24 Hours Prior to Your Surgery.  Do not use any chewable tobacco products for at least 6   hours prior to surgery.    No fume ni use cigarrillos electrnicos durante las 24 horas previas    a su [place].  No use ningn producto de tabaco masticable durante   al menos 6 horas antes de la [place].     __X_ 4. No alcohol for 24 hours before or after surgery.    No tome alcohol durante las 24 horas antes ni despus de la [place].    _X___ 5. Notify your doctor if there is any change in your medical condition (cold,fever, infections).    Informe a su mdico si hay algn cambio en su condicin mdica  (resfriado, fiebre, infecciones).   Do not wear jewelry, make-up, hairpins, clips or nail polish.  No use joyas, maquillajes, pinzas/ganchos para el cabello ni esmalte de uas.  Do not wear lotions, powders, or perfumes. You may wear deodorant.  No use lociones, polvos o perfumes.  Puede usar desodorante.    Do not shave 48 hours prior to surgery. Men may shave face and neck.  No se afeite 48 horas antes de la [place].  Los hombres pueden [employer] cara  y el cuello.    Do not bring valuables to the hospital.   No lleve objetos de valor al hospital.  [facility] is not responsible for any belongings or valuables.  [place] no se hace responsable de ningn tipo de pertenencias u objetos de Licensed conveyancer.               Contacts, dentures or bridgework may not be worn into surgery.  Los lentes de [place], las dentaduras postizas o puentes no se pueden usar en la [place].   Leave your suitcase in the car. After surgery it may be brought to your room.  Deje su maleta en el auto.  Despus de la ciruga podr traerla a su habitacin.   For patients admitted to the hospital, discharge time is determined by your  treatment team.  Para los pacientes que sean ingresados al hospital, el tiempo en el cual se le  dar de alta es determinado por su equipo de [place].   Patients discharged the day of surgery will not be allowed to drive home. A los pacientes que se les da de alta el mismo da de la ciruga no se les permitir conducir a Higher education careers adviser.    ____ Take these medicines the morning of surgery with A SIP OF WATER:          [employer] medicinas la maana de la ciruga con UN SORBO DE AGUA:  1. [place]   __X__ Use CHG Soap as directed          Utilice el jabn de CHG segn lo indicado  __X__ Use inhalers on the day of surgery          Use los inhaladores el da de la ciruga  albuterol (PROVENTIL HFA;VENTOLIN HFA) 108 (90 Base) MCG/ACT inhaler  __X__ Stop Anti-inflammatories such as Ibuprofen, Aleve, Advil, naproxen, aspirin and or BC powders.           Deje de tomar antiinflamatorios como ibuprofen, Motrin, Aleve, Advil, naproxen, aspirinas o polvos de BC powders.    __X__ Stop supplements until after surgery            Deje de tomar suplementos hasta despus de la ciruga  __X__ Do not start any herbal supplements before your surgery.   No empieze  a tomar suplementos de hierbas antes de su cirugia.

## 2020-05-23 ENCOUNTER — Other Ambulatory Visit
Admission: RE | Admit: 2020-05-23 | Discharge: 2020-05-23 | Disposition: A | Discharge status: Home / Self Care | Payer: Self-pay | Source: Ambulatory Visit | Attending: Surgery | Admitting: Surgery

## 2020-05-23 LAB — SARS CORONAVIRUS 2 (TAT 6-24 HRS): SARS Coronavirus 2: NEGATIVE

## 2020-05-27 ENCOUNTER — Ambulatory Visit: Payer: Self-pay | Admitting: Anesthesiology

## 2020-05-27 ENCOUNTER — Encounter
Admission: RE | Disposition: A | Discharge status: Home / Self Care | Payer: Self-pay | Source: Home / Self Care | Attending: Surgery

## 2020-05-27 ENCOUNTER — Other Ambulatory Visit: Payer: Self-pay

## 2020-05-27 ENCOUNTER — Encounter: Payer: Self-pay | Admitting: Surgery

## 2020-05-27 ENCOUNTER — Ambulatory Visit
Admission: RE | Admit: 2020-05-27 | Discharge: 2020-05-27 | Disposition: A | Discharge status: Home / Self Care | Payer: Self-pay | Attending: Surgery | Admitting: Surgery

## 2020-05-27 DIAGNOSIS — K429 Umbilical hernia without obstruction or gangrene: Secondary | ICD-10-CM

## 2020-05-27 DIAGNOSIS — Z79899 Other long term (current) drug therapy: Secondary | ICD-10-CM | POA: Insufficient documentation

## 2020-05-27 HISTORY — PX: UMBILICAL HERNIA REPAIR: SHX196

## 2020-05-27 LAB — POCT PREGNANCY, URINE: Preg Test, Ur: NEGATIVE

## 2020-05-27 SURGERY — REPAIR, HERNIA, UMBILICAL, ADULT
Anesthesia: General

## 2020-05-27 MED ORDER — OXYCODONE HCL 5 MG/5ML PO SOLN: 5.0000 mg | Freq: Once | ORAL | Status: DC | PRN

## 2020-05-27 MED ORDER — CEFAZOLIN SODIUM-DEXTROSE 2-4 GM/100ML-% IV SOLN
2.0000 g | INTRAVENOUS | Status: AC
  Administered 2020-05-27: 2 g via INTRAVENOUS

## 2020-05-27 MED ORDER — DEXAMETHASONE SODIUM PHOSPHATE 10 MG/ML IJ SOLN
INTRAMUSCULAR | Status: AC
  Filled 2020-05-27: qty 1

## 2020-05-27 MED ORDER — PHENYLEPHRINE HCL (PRESSORS) 10 MG/ML IV SOLN
INTRAVENOUS | Status: DC | PRN
  Administered 2020-05-27 (×2): 100 ug via INTRAVENOUS

## 2020-05-27 MED ORDER — BUPIVACAINE-EPINEPHRINE (PF) 0.5% -1:200000 IJ SOLN
INTRAMUSCULAR | Status: DC | PRN
  Administered 2020-05-27: 30 mL

## 2020-05-27 MED ORDER — CEFAZOLIN SODIUM-DEXTROSE 2-4 GM/100ML-% IV SOLN
INTRAVENOUS | Status: AC
  Filled 2020-05-27: qty 100

## 2020-05-27 MED ORDER — CHLORHEXIDINE GLUCONATE CLOTH 2 % EX PADS
6.0000 | MEDICATED_PAD | Freq: Once | CUTANEOUS | Status: AC
  Administered 2020-05-27: 6 via TOPICAL

## 2020-05-27 MED ORDER — EPHEDRINE SULFATE 50 MG/ML IJ SOLN
INTRAMUSCULAR | Status: DC | PRN
  Administered 2020-05-27 (×3): 10 mg via INTRAVENOUS
  Administered 2020-05-27: 5 mg via INTRAVENOUS

## 2020-05-27 MED ORDER — MIDAZOLAM HCL 2 MG/2ML IJ SOLN
INTRAMUSCULAR | Status: DC | PRN
  Administered 2020-05-27: 2 mg via INTRAVENOUS

## 2020-05-27 MED ORDER — ACETAMINOPHEN 500 MG PO TABS: 1000.0000 mg | ORAL_TABLET | ORAL | Status: AC

## 2020-05-27 MED ORDER — PROPOFOL 10 MG/ML IV BOLUS
INTRAVENOUS | Status: DC | PRN
  Administered 2020-05-27: 100 mg via INTRAVENOUS

## 2020-05-27 MED ORDER — SUGAMMADEX SODIUM 200 MG/2ML IV SOLN
INTRAVENOUS | Status: DC | PRN
  Administered 2020-05-27: 200 mg via INTRAVENOUS

## 2020-05-27 MED ORDER — ONDANSETRON HCL 4 MG/2ML IJ SOLN
INTRAMUSCULAR | Status: AC
  Filled 2020-05-27: qty 2

## 2020-05-27 MED ORDER — DEXAMETHASONE SODIUM PHOSPHATE 10 MG/ML IJ SOLN
INTRAMUSCULAR | Status: DC | PRN
  Administered 2020-05-27: 10 mg via INTRAVENOUS

## 2020-05-27 MED ORDER — GABAPENTIN 300 MG PO CAPS: 300.0000 mg | ORAL_CAPSULE | ORAL | Status: AC

## 2020-05-27 MED ORDER — SUCCINYLCHOLINE CHLORIDE 200 MG/10ML IV SOSY
PREFILLED_SYRINGE | INTRAVENOUS | Status: AC
  Filled 2020-05-27: qty 10

## 2020-05-27 MED ORDER — FENTANYL CITRATE (PF) 100 MCG/2ML IJ SOLN
25.0000 ug | INTRAMUSCULAR | Status: DC | PRN
  Administered 2020-05-27: 25 ug via INTRAVENOUS

## 2020-05-27 MED ORDER — FAMOTIDINE 20 MG PO TABS: 20.0000 mg | ORAL_TABLET | Freq: Once | ORAL | Status: DC

## 2020-05-27 MED ORDER — SCOPOLAMINE 1 MG/3DAYS TD PT72
MEDICATED_PATCH | TRANSDERMAL | Status: AC
  Filled 2020-05-27: qty 1

## 2020-05-27 MED ORDER — ROCURONIUM BROMIDE 10 MG/ML (PF) SYRINGE
PREFILLED_SYRINGE | INTRAVENOUS | Status: AC
  Filled 2020-05-27: qty 10

## 2020-05-27 MED ORDER — ACETAMINOPHEN 10 MG/ML IV SOLN
INTRAVENOUS | Status: AC
  Filled 2020-05-27: qty 100

## 2020-05-27 MED ORDER — CHLORHEXIDINE GLUCONATE 0.12 % MT SOLN: 15.0000 mL | Freq: Once | OROMUCOSAL | Status: AC

## 2020-05-27 MED ORDER — ACETAMINOPHEN 500 MG PO TABS
ORAL_TABLET | ORAL | Status: AC
  Administered 2020-05-27: 1000 mg via ORAL
  Filled 2020-05-27: qty 2

## 2020-05-27 MED ORDER — PROPOFOL 10 MG/ML IV BOLUS
INTRAVENOUS | Status: AC
  Filled 2020-05-27: qty 20

## 2020-05-27 MED ORDER — OXYCODONE HCL 5 MG PO TABS
5.0000 mg | ORAL_TABLET | ORAL | 0 refills | Status: DC | PRN
Start: 2020-05-27 — End: 2020-06-11

## 2020-05-27 MED ORDER — LIDOCAINE HCL (PF) 2 % IJ SOLN
INTRAMUSCULAR | Status: AC
  Filled 2020-05-27: qty 5

## 2020-05-27 MED ORDER — ONDANSETRON HCL 4 MG/2ML IJ SOLN
INTRAMUSCULAR | Status: DC | PRN
  Administered 2020-05-27: 4 mg via INTRAVENOUS

## 2020-05-27 MED ORDER — GABAPENTIN 300 MG PO CAPS
ORAL_CAPSULE | ORAL | Status: AC
  Administered 2020-05-27: 300 mg via ORAL
  Filled 2020-05-27: qty 1

## 2020-05-27 MED ORDER — IBUPROFEN 600 MG PO TABS
600.0000 mg | ORAL_TABLET | Freq: Three times a day (TID) | ORAL | 1 refills | Status: DC | PRN
Start: 2020-05-27 — End: 2020-06-11

## 2020-05-27 MED ORDER — PROMETHAZINE HCL 25 MG/ML IJ SOLN: 6.2500 mg | INTRAMUSCULAR | Status: DC | PRN

## 2020-05-27 MED ORDER — SCOPOLAMINE 1 MG/3DAYS TD PT72
MEDICATED_PATCH | TRANSDERMAL | Status: DC | PRN
  Administered 2020-05-27: 1 via TRANSDERMAL

## 2020-05-27 MED ORDER — ROCURONIUM BROMIDE 100 MG/10ML IV SOLN
INTRAVENOUS | Status: DC | PRN
  Administered 2020-05-27: 40 mg via INTRAVENOUS

## 2020-05-27 MED ORDER — OXYCODONE HCL 5 MG PO TABS: 5.0000 mg | ORAL_TABLET | Freq: Once | ORAL | Status: DC | PRN

## 2020-05-27 MED ORDER — MIDAZOLAM HCL 2 MG/2ML IJ SOLN
INTRAMUSCULAR | Status: AC
  Filled 2020-05-27: qty 2

## 2020-05-27 MED ORDER — FENTANYL CITRATE (PF) 250 MCG/5ML IJ SOLN
INTRAMUSCULAR | Status: AC
  Filled 2020-05-27: qty 5

## 2020-05-27 MED ORDER — FENTANYL CITRATE (PF) 100 MCG/2ML IJ SOLN
INTRAMUSCULAR | Status: DC | PRN
  Administered 2020-05-27 (×2): 50 ug via INTRAVENOUS

## 2020-05-27 MED ORDER — ORAL CARE MOUTH RINSE: 15.0000 mL | Freq: Once | OROMUCOSAL | Status: AC

## 2020-05-27 MED ORDER — EPHEDRINE 5 MG/ML INJ
INTRAVENOUS | Status: AC
  Filled 2020-05-27: qty 10

## 2020-05-27 MED ORDER — CHLORHEXIDINE GLUCONATE 0.12 % MT SOLN
OROMUCOSAL | Status: AC
  Administered 2020-05-27: 15 mL via OROMUCOSAL
  Filled 2020-05-27: qty 15

## 2020-05-27 MED ORDER — CHLORHEXIDINE GLUCONATE CLOTH 2 % EX PADS: 6.0000 | MEDICATED_PAD | Freq: Once | CUTANEOUS | Status: DC

## 2020-05-27 MED ORDER — FENTANYL CITRATE (PF) 100 MCG/2ML IJ SOLN
INTRAMUSCULAR | Status: AC
  Filled 2020-05-27: qty 2

## 2020-05-27 MED ORDER — LACTATED RINGERS IV SOLN: INTRAVENOUS | Status: DC

## 2020-05-27 MED ORDER — GLYCOPYRROLATE 0.2 MG/ML IJ SOLN
INTRAMUSCULAR | Status: AC
  Filled 2020-05-27: qty 1

## 2020-05-27 MED ORDER — BUPIVACAINE-EPINEPHRINE (PF) 0.5% -1:200000 IJ SOLN
INTRAMUSCULAR | Status: AC
  Filled 2020-05-27: qty 30

## 2020-05-27 MED ORDER — LIDOCAINE HCL (CARDIAC) PF 100 MG/5ML IV SOSY
PREFILLED_SYRINGE | INTRAVENOUS | Status: DC | PRN
  Administered 2020-05-27: 50 mg via INTRAVENOUS

## 2020-05-27 SURGICAL SUPPLY — 27 items
BLADE SURG 15 STRL LF DISP TIS (BLADE) ×1 IMPLANT
BLADE SURG 15 STRL SS (BLADE) ×2
CANISTER SUCT 1200ML W/VALVE (MISCELLANEOUS) ×3 IMPLANT
CHLORAPREP W/TINT 26 (MISCELLANEOUS) ×3 IMPLANT
COVER WAND RF STERILE (DRAPES) ×3 IMPLANT
DERMABOND ADVANCED (GAUZE/BANDAGES/DRESSINGS) ×2
DERMABOND ADVANCED .7 DNX12 (GAUZE/BANDAGES/DRESSINGS) ×1 IMPLANT
DRAPE LAPAROTOMY 77X122 PED (DRAPES) ×3 IMPLANT
ELECT REM PT RETURN 9FT ADLT (ELECTROSURGICAL) ×3
ELECTRODE REM PT RTRN 9FT ADLT (ELECTROSURGICAL) ×1 IMPLANT
GLOVE SURG SYN 7.0 (GLOVE) ×3 IMPLANT
GLOVE SURG SYN 7.5  E (GLOVE) ×2
GLOVE SURG SYN 7.5 E (GLOVE) ×1 IMPLANT
GOWN STRL REUS W/ TWL LRG LVL3 (GOWN DISPOSABLE) ×3 IMPLANT
GOWN STRL REUS W/TWL LRG LVL3 (GOWN DISPOSABLE) ×6
MESH VENTRALEX ST 2.5 CRC MED (Mesh General) ×3 IMPLANT
NEEDLE HYPO 22GX1.5 SAFETY (NEEDLE) ×3 IMPLANT
NS IRRIG 500ML POUR BTL (IV SOLUTION) ×3 IMPLANT
PACK BASIN MINOR (MISCELLANEOUS) ×3 IMPLANT
SUT ETHIBOND 0 MO6 C/R (SUTURE) ×3 IMPLANT
SUT MNCRL AB 4-0 PS2 18 (SUTURE) ×3 IMPLANT
SUT VIC AB 2-0 SH 27 (SUTURE) ×2
SUT VIC AB 2-0 SH 27XBRD (SUTURE) ×1 IMPLANT
SUT VIC AB 3-0 SH 27 (SUTURE) ×2
SUT VIC AB 3-0 SH 27X BRD (SUTURE) ×1 IMPLANT
SYR 20ML LL LF (SYRINGE) ×3 IMPLANT
SYR BULB IRRIG 60ML STRL (SYRINGE) ×3 IMPLANT

## 2020-05-27 NOTE — Discharge Instructions (Addendum)
AMBULATORY SURGERY  °DISCHARGE INSTRUCTIONS ° ° °1) The drugs that you were given will stay in your system until tomorrow so for the next 24 hours you should not: ° °A) Drive an automobile °B) Make any legal decisions °C) Drink any alcoholic beverage ° ° °2) You may resume regular meals tomorrow.  Today it is better to start with liquids and gradually work up to solid foods. ° °You may eat anything you prefer, but it is better to start with liquids, then soup and crackers, and gradually work up to solid foods. ° ° °3) Please notify your doctor immediately if you have any unusual bleeding, trouble breathing, redness and pain at the surgery site, drainage, fever, or pain not relieved by medication. ° ° ° °4) Additional Instructions: ° ° ° ° ° ° ° °Please contact your physician with any problems or Same Day Surgery at [phone], Monday through Friday 6 am to 4 pm, or [place] [facility] [place] Main number at [phone].AMBULATORY SURGERY  °DISCHARGE INSTRUCTIONS ° ° °5) The drugs that you were given will stay in your system until tomorrow so for the next 24 hours you should not: ° °D) Drive an automobile °E) Make any legal decisions °F) Drink any alcoholic beverage ° ° °6) You may resume regular meals tomorrow.  Today it is better to start with liquids and gradually work up to solid foods. ° °You may eat anything you prefer, but it is better to start with liquids, then soup and crackers, and gradually work up to solid foods. ° ° °7) Please notify your doctor immediately if you have any unusual bleeding, trouble breathing, redness and pain at the surgery site, drainage, fever, or pain not relieved by medication. ° ° ° °8) Additional Instructions: ° ° ° ° ° ° ° °Please contact your physician with any problems or Same Day Surgery at [phone], Monday through Friday 6 am to 4 pm, or [place] at  Main number at [phone]. °

## 2020-05-27 NOTE — Transfer of Care (Signed)
Immediate Anesthesia Transfer of Care Note  Patient: Alexandra Hardy [facility]   Procedure(s) Performed: HERNIA REPAIR UMBILICAL ADULT, Open with mesh (N/A )  Patient Location: PACU  Anesthesia Type:General  Level of Consciousness: drowsy  Airway & Oxygen Therapy: Patient Spontanous Breathing and Patient connected to face mask oxygen  Post-op Assessment: Report given to RN and Post -op Vital signs reviewed and stable  Post vital signs: Reviewed and stable  Last Vitals:  Vitals Value Taken Time  BP 109/68 05/27/20 0946  Temp 36.2 C 05/27/20 0945  Pulse 101 05/27/20 0948  Resp 16 05/27/20 0948  SpO2 100 % 05/27/20 0948  Vitals shown include unvalidated device data.  Last Pain:  Vitals:   05/27/20 0714  TempSrc: Oral  PainSc: 0-No pain         Complications: No complications documented.

## 2020-05-27 NOTE — Anesthesia Preprocedure Evaluation (Addendum)
Anesthesia Evaluation  Patient identified by MRN, date of birth, ID band Patient awake    Reviewed: Allergy & Precautions, H&P , NPO status , Patient's Chart, lab work & pertinent test results  Airway Mallampati: II  TM Distance: >3 FB Neck ROM: full    Dental  (+) Teeth Intact   Pulmonary  Required an albuterol inhaler about two months ago during a respiratory infection.  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  Past Surgical History: No date: NO PAST SURGERIES  BMI    Body Mass Index: 22.86 kg/m      Reproductive/Obstetrics negative OB ROS                            Anesthesia Physical Anesthesia Plan  ASA: I  Anesthesia Plan: General ETT   Post-op Pain Management:    Induction:   PONV Risk Score and Plan: Ondansetron, Dexamethasone, Midazolam and Treatment may vary due to age or medical condition  Airway Management Planned:   Additional Equipment:   Intra-op Plan:   Post-operative Plan:   Informed Consent: I have reviewed the patients History and Physical, chart, labs and discussed the procedure including the risks, benefits and alternatives for the proposed anesthesia with the patient or authorized representative who has indicated his/her understanding and acceptance.     Dental Advisory Given  Plan Discussed with: Anesthesiologist, CRNA and Surgeon  Anesthesia Plan Comments:        Anesthesia Quick  Evaluation

## 2020-05-27 NOTE — Op Note (Signed)
Procedure Date:  05/27/2020  Pre-operative Diagnosis:  Umbilical hernia  Post-operative Diagnosis:  Umbilical hernia  Procedure:  Umbilical hernia repair with mesh; excision of excess umbilical skin.  Surgeon:  Howie Ill, MD  Assistant:  Greer Pickerel, PA-S  Anesthesia:  General endotracheal  Estimated Blood Loss:  5 ml  Specimens:  Hernia sac  Complications:  None  Indications for Procedure:  This is a 29 y.o. female who presents with an umbilical hernia as a result of pregnancies.  The risks of bleeding, abscess or infection, injury to surrounding structures, and need for further procedures were all discussed with the patient and was willing to proceed.  Description of Procedure: The patient was correctly identified in the preoperative area and brought into the operating room.  The patient was placed supine with VTE prophylaxis in place.  Appropriate time-outs were performed.  Anesthesia was induced and the patient was intubated.  Appropriate antibiotics were infused.  The abdomen was prepped and draped in a sterile fashion.  A supraumbilical incision was made and cautery was used to dissect down the subcutaneous tissue along the umbilical stalk.  Kelly forceps were used to dissect the umbilical stalk and it was divided at the fascia using cautery.  This allowed visualization of the hernia defect.  The hernia was reduced without complications.  It contained preperitoneal fat which was resected.  The fascial edges were cleaned using cautery.  Overall, the defect measured about 2 cm.  A 6.4 cm Bard Ventralex ST mesh was introduced via the defect and spread open.  The tails were secured to the fascia using 2-0 Prolene sutures.  The fascia was then closed with 0 Ethibond sutures, incorporating a layer of the mesh with each suture.  Due the hernia chronicity, the patient had excess umbilical skin that when approximating the skin edges, looked sagging.  Thus, it was decided to excise a  margin of 5 mm from the inferior edges.  The umbilical stalk was then reattached to the fascia using 2-0 Vicryl. The wound was irrigated and local anesthetic was infused.  The wound was then closed in layers using 2-0 Vicryl, 3-0 Vicryl and 4-0 Monocryl. The incision was cleaned and sealed with DermaBond.  The patient was emerged from anesthesia and extubated and brought to the recovery room for further management.  The patient tolerated the procedure well and all counts were correct at the end of the case.   Howie Ill, MD

## 2020-05-27 NOTE — Anesthesia Procedure Notes (Addendum)
Procedure Name: Intubation Date/Time: 05/27/2020 8:05 AM Performed by: Dava Najjar, CRNA Pre-anesthesia Checklist: Patient identified, Patient being monitored, Emergency Drugs available and Suction available Patient Re-evaluated:Patient Re-evaluated prior to induction Oxygen Delivery Method: Circle system utilized Preoxygenation: Pre-oxygenation with 100% oxygen Induction Type: IV induction Ventilation: Mask ventilation without difficulty Laryngoscope Size: Miller and 2 Grade View: Grade I Tube type: Oral Tube size: 7.0 mm Number of attempts: 1 Airway Equipment and Method: Stylet Placement Confirmation: ETT inserted through vocal cords under direct vision,  positive ETCO2 and breath sounds checked- equal and bilateral Secured at: 21 cm Tube secured with: Tape Dental Injury: Teeth and Oropharynx as per pre-operative assessment

## 2020-05-27 NOTE — Interval H&P Note (Signed)
History and Physical Interval Note:  05/27/2020 7:20 AM  Alexandra Hardy  has presented today for surgery, with the diagnosis of Umbilical hernia.  The various methods of treatment have been discussed with the patient and family. After consideration of risks, benefits and other options for treatment, the patient has consented to  Procedure(s): HERNIA REPAIR UMBILICAL ADULT, Open with mesh (N/A) as a surgical intervention.  The patient's history has been reviewed, patient examined, no change in status, stable for surgery.  I have reviewed the patient's chart and labs.  Questions were answered to the patient's satisfaction.     [name]

## 2020-05-28 ENCOUNTER — Encounter: Payer: Self-pay | Admitting: Surgery

## 2020-05-28 LAB — SURGICAL PATHOLOGY

## 2020-05-28 NOTE — Anesthesia Postprocedure Evaluation (Signed)
Anesthesia Post Note  Patient: KAHLYN SHIPPEY [facility]  Procedure(s) Performed: HERNIA REPAIR UMBILICAL ADULT, Open with mesh (N/A )  Patient location during evaluation: PACU Anesthesia Type: General Level of consciousness: awake and alert Pain management: pain level controlled Vital Signs Assessment: post-procedure vital signs reviewed and stable Respiratory status: spontaneous breathing, nonlabored ventilation and respiratory function stable Cardiovascular status: blood pressure returned to baseline and stable Postop Assessment: no apparent nausea or vomiting Anesthetic complications: no   No complications documented.   Last Vitals:  Vitals:   05/27/20 1038 05/27/20 1103  BP: 112/60 (!) 103/55  Pulse: 83 86  Resp: 14 16  Temp: 36.9 C   SpO2: 99% 99%    Last Pain:  Vitals:   05/27/20 1038  TempSrc: Temporal                 Karleen Hampshire

## 2020-05-29 ENCOUNTER — Telehealth: Payer: Self-pay | Admitting: Surgery

## 2020-05-29 NOTE — Telephone Encounter (Signed)
Patient's husband, Lynne Logan called.  York Spaniel his wife had surgery two days ago.  Since she has not been able to go to the bathroom at all.  She is drinking some fluids, but not a whole lot.  States she did have a fever yesterday (05/28/20) at 102.0, but no fever today.  No vomiting or diarrhea.  Some pain at the surgical site.  Please call husband as he speaks [place] well, Lynne Logan.  Thank you.

## 2020-05-29 NOTE — Telephone Encounter (Signed)
Spoke with patient's husband and notified him that his wife (patient) may try over the counter Miralax or a stool softener to help with her bowel movements. Advised that patient is drinking plenty of fluids to help with staying hydrated. Husband was also told that if patient has any fever of 102 again to contact our office or take patient to local emergency department. Patient's husband verbalized understanding.

## 2020-05-30 ENCOUNTER — Encounter: Payer: Self-pay | Admitting: Surgery

## 2020-06-01 ENCOUNTER — Emergency Department (HOSPITAL_COMMUNITY)
Admission: EM | Admit: 2020-06-01 | Discharge: 2020-06-01 | Disposition: A | Discharge status: Home / Self Care | Payer: Self-pay | Attending: Emergency Medicine | Admitting: Emergency Medicine

## 2020-06-01 ENCOUNTER — Encounter (HOSPITAL_COMMUNITY): Payer: Self-pay | Admitting: Emergency Medicine

## 2020-06-01 ENCOUNTER — Other Ambulatory Visit: Payer: Self-pay

## 2020-06-01 DIAGNOSIS — Z5321 Procedure and treatment not carried out due to patient leaving prior to being seen by health care provider: Secondary | ICD-10-CM | POA: Insufficient documentation

## 2020-06-01 DIAGNOSIS — R1013 Epigastric pain: Secondary | ICD-10-CM | POA: Insufficient documentation

## 2020-06-01 DIAGNOSIS — R0789 Other chest pain: Secondary | ICD-10-CM | POA: Insufficient documentation

## 2020-06-01 LAB — COMPREHENSIVE METABOLIC PANEL
ALT: 20 U/L (ref 0–44)
AST: 18 U/L (ref 15–41)
Albumin: 3.8 g/dL (ref 3.5–5.0)
Alkaline Phosphatase: 62 U/L (ref 38–126)
Anion gap: 10 (ref 5–15)
BUN: 14 mg/dL (ref 6–20)
CO2: 23 mmol/L (ref 22–32)
Calcium: 9.1 mg/dL (ref 8.9–10.3)
Chloride: 104 mmol/L (ref 98–111)
Creatinine, Ser: 0.64 mg/dL (ref 0.44–1.00)
GFR calc Af Amer: >60 mL/min (ref >60)
GFR calc non Af Amer: >60 mL/min (ref >60)
Glucose, Bld: 157 mg/dL — ABNORMAL HIGH (ref 70–99)
Potassium: 4.2 mmol/L (ref 3.5–5.1)
Sodium: 137 mmol/L (ref 135–145)
Total Bilirubin: 0.5 mg/dL (ref 0.3–1.2)
Total Protein: 7.5 g/dL (ref 6.5–8.1)

## 2020-06-01 LAB — CBC
HCT: 41.2 % (ref 36.0–46.0)
Hemoglobin: 13.9 g/dL (ref 12.0–15.0)
MCH: 31.8 pg (ref 26.0–34.0)
MCHC: 33.7 g/dL (ref 30.0–36.0)
MCV: 94.3 fL (ref 80.0–100.0)
Platelets: 224 10*3/uL (ref 150–400)
RBC: 4.37 MIL/uL (ref 3.87–5.11)
RDW: 12 % (ref 11.5–15.5)
WBC: 5.8 10*3/uL (ref 4.0–10.5)
nRBC: 0 % (ref 0.0–0.2)

## 2020-06-01 LAB — I-STAT BETA HCG BLOOD, ED (MC, WL, AP ONLY): I-stat hCG, quantitative: <5 m[IU]/mL (ref <5)

## 2020-06-01 LAB — TROPONIN I (HIGH SENSITIVITY): Troponin I (High Sensitivity): <2 ng/L (ref <18)

## 2020-06-01 LAB — LIPASE, BLOOD: Lipase: 31 U/L (ref 11–51)

## 2020-06-01 MED ORDER — SODIUM CHLORIDE 0.9% FLUSH: 3.0000 mL | Freq: Once | INTRAVENOUS | Status: DC

## 2020-06-01 NOTE — ED Triage Notes (Signed)
C/o epigastric pain and R sided chest pain since having hernia surgery on Tuesday.  States pain is burning and feels better after eating.

## 2020-06-01 NOTE — ED Notes (Signed)
Per registration staff, pt left  

## 2020-06-03 ENCOUNTER — Other Ambulatory Visit: Payer: Self-pay

## 2020-06-03 ENCOUNTER — Encounter: Payer: Self-pay | Admitting: Family Medicine

## 2020-06-03 ENCOUNTER — Ambulatory Visit: Payer: Self-pay | Attending: Family Medicine | Admitting: Family Medicine

## 2020-06-03 VITALS — BP 92/57 | HR 82 | Ht 62.0 in | Wt 128.4 lb

## 2020-06-03 DIAGNOSIS — K2900 Acute gastritis without bleeding: Secondary | ICD-10-CM

## 2020-06-03 MED ORDER — PANTOPRAZOLE SODIUM 40 MG PO TBEC: 40.0000 mg | DELAYED_RELEASE_TABLET | Freq: Every day | ORAL | 0 refills | Status: DC

## 2020-06-03 NOTE — Patient Instructions (Signed)
Gastritis - Adultos Gastritis, Adult  La gastritis es una hinchazn (inflamacin) del estmago. La gastritis puede desarrollarse rpidamente (aguda). Tambin puede desarrollarse lentamente con el transcurso del tiempo (crnica). Es importante recibir ayuda para esta afeccin. Si no obtiene ayuda, el estmago puede sangrar y puede desarrollar llagas (lceras) en el estmago. Cules son las causas? Esta afeccin puede ser causada por lo siguiente:  Grmenes que llegan al estmago.  Beber alcohol en exceso.  Los medicamentos que toma.  Demasiado cido en el estmago.  Una enfermedad del intestino o del estmago.  Estrs.  Una reaccin alrgica.  Enfermedad de Crohn.  Algunos tratamientos para el cncer (radiacin). A veces, se desconoce la causa de esta afeccin. Cules son los signos o los sntomas? Los sntomas de esta afeccin incluyen los siguientes:  Dolor en el estmago.  Una sensacin de ardor en el estmago.  Ganas de vomitar (nuseas).  Vmitos.  Sensacin de estar demasiado lleno luego de comer.  Prdida de peso.  Mal aliento.  Vomitar sangre.  Sangre en las heces. Cmo se diagnostica? Esta afeccin puede diagnosticarse en funcin de lo siguiente:  Los antecedentes mdicos y sntomas.  Un examen fsico.  Estudios. Estos pueden incluir los siguientes: ? Anlisis de sangre. ? Pruebas de heces. ? Un procedimiento para observar el interior del estmago (endoscopa alta). ? Un estudio en el cual se toma una muestra de tejido para analizarlo (biopsia). Cmo se trata? El tratamiento de esta afeccin depende de la causa. Es posible que le administren lo siguiente:  Antibiticos si la afeccin es causada por grmenes.  Bloqueadores H2 y medicamentos similares, si la afeccin fue causada por una gran cantidad de cido. Siga estas indicaciones en su casa: Medicamentos  Tome los medicamentos de venta libre y los recetados solamente como se lo haya  indicado el mdico.  Si le recetaron un antibitico, tmelo como se lo haya indicado el mdico. No deje de tomarlo aunque comience a sentirse mejor. Comida y bebida   Haga comidas pequeas y frecuentes en lugar de comidas abundantes.  Evite los alimentos y las bebidas que intensifican los sntomas.  Beba suficiente lquido para mantener la orina de color amarillo plido. Consumo de alcohol  No beba alcohol si: ? El mdico le indica que no lo haga. ? Est embarazada, puede estar embarazada o est tratando de quedar embarazada.  Si bebe alcohol: ? Limite su uso a las siguientes medidas:  De 0 a 1 medida por da para las mujeres.  De 0 a 2 medidas por da para los hombres. ? Est atento a la cantidad de alcohol que hay en las bebidas que toma. En los [place], una medida equivale a una botella de cerveza de 12oz (355ml), un vaso de vino de 5oz (148ml) o un vaso de una bebida alcohlica de alta graduacin de 1oz (44ml). Indicaciones generales  Converse con el mdico sobre maneras de controlar el estrs. Puede realizar ejercicios de respiracin profunda, meditacin o yoga.  No fume ni consuma productos que contengan nicotina ni tabaco. Si necesita ayuda para dejar de fumar, consulte al mdico.  Concurra a todas las visitas de seguimiento como se lo haya indicado el mdico. Esto es importante. Comunquese con un mdico si:  Sus sntomas empeoran.  Los sntomas desaparecen y luego reaparecen. Solicite ayuda inmediatamente si:  Vomita sangre o una sustancia parecida a los granos de caf.  La materia fecal es negra o de color rojo oscuro.  Vomita cada vez que intenta tomar lquidos.    El dolor de estmago empeora.  Tiene fiebre.  No se siente mejor luego de una semana. Resumen  La gastritis es una hinchazn (inflamacin) del estmago.  Debe obtener ayuda para tratar esta afeccin. Si no obtiene ayuda, el estmago puede sangrar y pueden formarse llagas  (lceras).  Esta afeccin se diagnostica mediante los antecedentes mdicos, un examen fsico o estudios.  Puede recibir tratamiento con medicamentos para los grmenes o medicamentos para bloquear la presencia de demasiada cantidad de cido en el estmago. Esta informacin no tiene como fin reemplazar el consejo del mdico. Asegrese de hacerle al mdico cualquier pregunta que tenga. Document Revised: 05/18/2018 Document Reviewed: 05/18/2018 Elsevier Patient Education  2020 [employer].  

## 2020-06-03 NOTE — Progress Notes (Signed)
Subjective:  Patient ID: Alexandra Hardy, female    DOB: March 02, 1991  Age: 29 y.o. MRN: [id number]  CC: Hospitalization Follow-up and Abdominal Pain   HPI Alexandra Hardy is a 29 year old female patient of Dr.Fulp who presents for follow-up after umbilical hernia repair with mesh by general surgery 1 week ago. Sees her General Surgeon next week for follow-up visit. She has epigastric pain in the morning which is burning and she assumes might be due to hunger and eating alleviates the pain. Denies reflux, nausea or vomiting and Symptoms commenced post surgery and she endorses using a lot of ibuprofen and oxycodone for her pain. She has no pain at the site of her incision and has been moving her bowels okay.  Past Medical History:  Diagnosis Date  . Gestational diabetes   . History of HPV infection     Past Surgical History:  Procedure Laterality Date  . NO PAST SURGERIES    . UMBILICAL HERNIA REPAIR N/A 05/27/2020   Procedure: HERNIA REPAIR UMBILICAL ADULT, Open with mesh;  Surgeon: Henrene Dodge, MD;  Location: [facility] ORS;  Service: General;  Laterality: N/A;    Family History  Problem Relation Age of Onset  . Diabetes Father     No Known Allergies  Outpatient Medications Prior to Visit  Medication Sig Dispense Refill  . albuterol (PROVENTIL HFA;VENTOLIN HFA) 108 (90 Base) MCG/ACT inhaler Inhale 2 puffs into the lungs every 6 (six) hours as needed for wheezing or shortness of breath. Or cough 1 Inhaler 6  . acetaminophen (TYLENOL) 325 MG tablet Take 650 mg by mouth every 6 (six) hours as needed for moderate pain or headache. (Patient not taking: Reported on 05/21/2020)    . cholecalciferol (VITAMIN D3) 25 MCG (1000 UNIT) tablet Take 1,000 Units by mouth daily. (Patient not taking: Reported on 06/03/2020)    . fexofenadine (ALLEGRA) 180 MG tablet Take 180 mg by mouth daily. (Patient not taking: Reported on 06/03/2020)    . fluticasone (FLONASE) 50 MCG/ACT nasal spray  Place 2 sprays into both nostrils daily. (Patient not taking: Reported on 05/22/2020) 1 g 0  . ibuprofen (ADVIL) 600 MG tablet Take 1 tablet (600 mg total) by mouth every 8 (eight) hours as needed for mild pain or moderate pain. (Patient not taking: Reported on 06/03/2020) 30 tablet 1  . oxyCODONE (OXY IR/ROXICODONE) 5 MG immediate release tablet Take 1 tablet (5 mg total) by mouth every 4 (four) hours as needed for severe pain. (Patient not taking: Reported on 06/03/2020) 30 tablet 0  . Vitamin D, Ergocalciferol, (DRISDOL) 50000 units CAPS capsule One capsule once per week; take after last breastfeeding of the day (Patient not taking: Reported on 05/21/2020) 4 capsule 6   No facility-administered medications prior to visit.     ROS Review of Systems  Constitutional: Negative for activity change, appetite change and fatigue.  HENT: Negative for congestion, sinus pressure and sore throat.   Eyes: Negative for visual disturbance.  Respiratory: Negative for cough, chest tightness, shortness of breath and wheezing.   Cardiovascular: Negative for chest pain and palpitations.  Gastrointestinal: Positive for abdominal pain. Negative for abdominal distention and constipation.  Endocrine: Negative for polydipsia.  Genitourinary: Negative for dysuria and frequency.  Musculoskeletal: Negative for arthralgias and back pain.  Skin: Negative for rash.  Neurological: Negative for tremors, light-headedness and numbness.  Hematological: Does not bruise/bleed easily.  Psychiatric/Behavioral: Negative for agitation and behavioral problems.    Objective:  BP (!) 92/57  Pulse 82   Ht 5\' 2"  (1.575 m)   Wt 128 lb 6.4 oz (58.2 kg)   LMP 05/10/2020 (Exact Date)   SpO2 98%   BMI 23.48 kg/m   BP/Weight 06/03/2020 06/01/2020 05/27/2020  Systolic BP 92 104 103  Diastolic BP 57 77 55  Wt. (Lbs) 128.4 - 125  BMI 23.48 - 22.86      Physical Exam Constitutional:      Appearance: She is well-developed.  Neck:       Vascular: No JVD.  Cardiovascular:     Rate and Rhythm: Normal rate.     Heart sounds: Normal heart sounds. No murmur heard.   Pulmonary:     Effort: Pulmonary effort is normal.     Breath sounds: Normal breath sounds. No wheezing or rales.  Chest:     Chest wall: No tenderness.  Abdominal:     General: A surgical scar is present. Bowel sounds are normal. There is no distension.     Palpations: Abdomen is soft. There is no mass.     Tenderness: There is abdominal tenderness in the epigastric area. Negative signs include Murphy's sign.  Musculoskeletal:        General: Normal range of motion.     Right lower leg: No edema.     Left lower leg: No edema.  Neurological:     Mental Status: She is alert and oriented to person, place, and time.  Psychiatric:        Mood and Affect: Mood normal.     CMP Latest Ref Rng & Units 06/01/2020 04/20/2017 04/20/2017  Glucose 70 - 99 mg/dL 621(H) 87 086(V)  BUN 6 - 20 mg/dL 14 9 14   Creatinine 0.44 - 1.00 mg/dL 7.84 6.96 2.95  Sodium 135 - 145 mmol/L 137 137 136  Potassium 3.5 - 5.1 mmol/L 4.2 4.1 4.0  Chloride 98 - 111 mmol/L 104 105 106  CO2 22 - 32 mmol/L 23 25 24   Calcium 8.9 - 10.3 mg/dL 9.1 2.8(U) 9.0  Total Protein 6.5 - 8.1 g/dL 7.5 1.3(K) -  Total Bilirubin 0.3 - 1.2 mg/dL 0.5 0.6 -  Alkaline Phos 38 - 126 U/L 62 90 -  AST 15 - 41 U/L 18 32 -  ALT 0 - 44 U/L 20 22 -    Lipid Panel  No results found for: CHOL, TRIG, HDL, CHOLHDL, VLDL, LDLCALC, LDLDIRECT  CBC    Component Value Date/Time   WBC 5.8 06/01/2020 1430   RBC 4.37 06/01/2020 1430   HGB 13.9 06/01/2020 1430   HGB 13.7 08/15/2018 1537   HCT 41.2 06/01/2020 1430   HCT 40.4 08/15/2018 1537   PLT 224 06/01/2020 1430   PLT 243 08/15/2018 1537   MCV 94.3 06/01/2020 1430   MCV 95 08/15/2018 1537   MCH 31.8 06/01/2020 1430   MCHC 33.7 06/01/2020 1430   RDW 12.0 06/01/2020 1430   RDW 13.5 08/15/2018 1537   LYMPHSABS 1.4 08/15/2018 1537   MONOABS 0.2 09/02/2017  2024   EOSABS 0.3 08/15/2018 1537   BASOSABS 0.0 08/15/2018 1537    Lab Results  Component Value Date   HGBA1C 5.3 12/28/2019    Assessment & Plan:  1. Other acute gastritis without hemorrhage Secondary to NSAID use post surgery Advised to discontinue NSAIDs Avoid foods that trigger - pantoprazole (PROTONIX) 40 MG tablet; Take 1 tablet (40 mg total) by mouth daily.  Dispense: 30 tablet; Refill: 0    Meds ordered this encounter  Medications  .  pantoprazole (PROTONIX) 40 MG tablet    Sig: Take 1 tablet (40 mg total) by mouth daily.    Dispense:  30 tablet    Refill:  0    Follow-up: Return if symptoms worsen or fail to improve.       Hoy Register, MD, FAAFP. [facility] [place], [place] [phone]   06/03/2020, 5:09 PM

## 2020-06-03 NOTE — Progress Notes (Signed)
States that abdominal pain comes and goes.

## 2020-06-11 ENCOUNTER — Ambulatory Visit (INDEPENDENT_AMBULATORY_CARE_PROVIDER_SITE_OTHER): Payer: Self-pay | Admitting: Surgery

## 2020-06-11 ENCOUNTER — Encounter: Payer: Self-pay | Admitting: Surgery

## 2020-06-11 ENCOUNTER — Other Ambulatory Visit: Payer: Self-pay

## 2020-06-11 DIAGNOSIS — K429 Umbilical hernia without obstruction or gangrene: Secondary | ICD-10-CM

## 2020-06-11 DIAGNOSIS — Z09 Encounter for follow-up examination after completed treatment for conditions other than malignant neoplasm: Secondary | ICD-10-CM

## 2020-06-11 NOTE — Patient Instructions (Signed)
GENERAL POST-OPERATIVE PATIENT INSTRUCTIONS   WOUND CARE INSTRUCTIONS:  Keep a dry clean dressing on the wound if there is drainage. The initial bandage may be removed after 24 hours.  Once the wound has quit draining you may leave it open to air.  If clothing rubs against the wound or causes irritation and the wound is not draining you may cover it with a dry dressing during the daytime.  Try to keep the wound dry and avoid ointments on the wound unless directed to do so.  If the wound becomes bright red and painful or starts to drain infected material that is not clear, please contact your physician immediately.  If the wound is mildly pink and has a thick firm ridge underneath it, this is normal, and is referred to as a healing ridge.  This will resolve over the next 4-6 weeks.  BATHING: You may shower if you have been informed of this by your surgeon. However, Please do not submerge in a tub, hot tub, or pool until incisions are completely sealed or have been told by your surgeon that you may do so.  DIET:  You may eat any foods that you can tolerate.  It is a good idea to eat a high fiber diet and take in plenty of fluids to prevent constipation.  If you do become constipated you may want to take a mild laxative or take ducolax tablets on a daily basis until your bowel habits are regular.  Constipation can be very uncomfortable, along with straining, after recent surgery.  ACTIVITY:  You are encouraged to cough and deep breath or use your incentive spirometer if you were given one, every 15-30 minutes when awake.  This will help prevent respiratory complications and low grade fevers post-operatively if you had a general anesthetic.  You may want to hug a pillow when coughing and sneezing to add additional support to the surgical area, if you had abdominal or chest surgery, which will decrease pain during these times.  You are encouraged to walk and engage in light activity for the next two weeks.  You  should not lift more than 20 pounds, until 07/08/2020  as it could put you at increased risk for complications.  Twenty pounds is roughly equivalent to a plastic bag of groceries. At that time- Listen to your body when lifting, if you have pain when lifting, stop and then try again in a few days. Soreness after doing exercises or activities of daily living is normal as you get back in to your normal routine.  MEDICATIONS:  Try to take narcotic medications and anti-inflammatory medications, such as tylenol, ibuprofen, naprosyn, etc., with food.  This will minimize stomach upset from the medication.  Should you develop nausea and vomiting from the pain medication, or develop a rash, please discontinue the medication and contact your physician.  You should not drive, make important decisions, or operate machinery when taking narcotic pain medication.  SUNBLOCK Use sun block to incision area over the next year if this area will be exposed to sun. This helps decrease scarring and will allow you avoid a permanent darkened area over your incision.  QUESTIONS:  Please feel free to call our office if you have any questions, and we will be glad to assist you. [phone]    

## 2020-06-11 NOTE — Progress Notes (Signed)
06/11/2020  HPI: Alexandra Hardy is a 29 y.o. female s/p open umbilical hernia repair on 05/27/2020.  Patient presents today for follow-up.  Patient presented to her PCP on 7/13 because of upper abdominal discomfort and was diagnosed with gastritis likely from NSAID use.  She has since stopped taking her ibuprofen but has also not needed narcotics for pain control from her surgery.  Patient denies any nausea or vomiting.  Reports that her epigastric pain is now resolved.  Patient reports that the incision is healing well and has not noticed any significant pain.  Sometimes she will have some itching.  Vital signs: There were no vitals taken for this visit.   Physical Exam: Constitutional: No acute distress  Abdomen: Soft, nondistended, appropriately sore to palpation.  Umbilical incision is clean, dry, intact and healing well.  Dermabond still in place.  No evidence of recurrence.  Assessment/Plan: This is a 29 y.o. female s/p umbilical hernia repair with mesh.  -Patient currently feeling well without any complications. -Patient still has 2 more weeks of no heavy lifting or pushing of no more than 10 to 15 pounds.  However she may return to work with light duties next Monday 7/26.  We will write her a work note for this. -Follow-up with [place] as needed.   Howie Ill, MD [facility]

## 2021-01-21 ENCOUNTER — Ambulatory Visit: Payer: Self-pay | Admitting: Physician Assistant

## 2021-03-03 ENCOUNTER — Ambulatory Visit: Payer: Self-pay | Admitting: Internal Medicine

## 2021-03-04 ENCOUNTER — Encounter: Payer: Self-pay | Admitting: Surgery

## 2021-03-04 ENCOUNTER — Other Ambulatory Visit: Payer: Self-pay

## 2021-03-04 ENCOUNTER — Ambulatory Visit (INDEPENDENT_AMBULATORY_CARE_PROVIDER_SITE_OTHER): Payer: Self-pay | Admitting: Surgery

## 2021-03-04 VITALS — BP 109/68 | HR 65 | Temp 98.6°F | Ht 63.0 in | Wt 131.8 lb

## 2021-03-04 DIAGNOSIS — K429 Umbilical hernia without obstruction or gangrene: Secondary | ICD-10-CM

## 2021-03-04 NOTE — Patient Instructions (Addendum)
If you have any concerns or questions, please feel free to call our office. Let us know if you want to go ahead with surgery. It needs to be within 30 days or we will have to schedule another appointment.

## 2021-03-04 NOTE — Progress Notes (Signed)
03/04/2021  History of Present Illness: Alexandra Hardy is a 30 y.o. female s/p open umbilical hernia repair with mesh on 05/27/20, presenting today for evaluation of possible recurrence.  The patient reports that since around December that she had become more active and doing exercises, she has noticed an area at the center of the umbilicus which feels like a small ball and with some bulging sensation.  She reports it's more noticeable when she's doing some heavier activity.  This is associated with discomfort around the umbilicus.  The discomfort does not radiate.  Denies any fevers, chills, chest pain, shortness of breath, nausea, vomiting.  Past Medical History: Past Medical History:  Diagnosis Date  . Gestational diabetes   . History of HPV infection      Past Surgical History: Past Surgical History:  Procedure Laterality Date  . UMBILICAL HERNIA REPAIR N/A 05/27/2020   Procedure: HERNIA REPAIR UMBILICAL ADULT, Open with mesh;  Surgeon: Henrene Dodge, MD;  Location: [facility] ORS;  Service: General;  Laterality: N/A;    Home Medications: Prior to Admission medications   Medication Sig Start Date End Date Taking? Authorizing Provider  acetaminophen (TYLENOL) 325 MG tablet Take 650 mg by mouth every 6 (six) hours as needed for moderate pain or headache.    Yes [provider]  albuterol (PROVENTIL HFA;VENTOLIN HFA) 108 (90 Base) MCG/ACT inhaler Inhale 2 puffs into the lungs every 6 (six) hours as needed for wheezing or shortness of breath. Or cough 09/05/18  Yes Fulp, Cammie, MD  fexofenadine (ALLEGRA) 180 MG tablet Take 180 mg by mouth daily.    Yes [provider]  fluticasone (FLONASE) 50 MCG/ACT nasal spray Place 2 sprays into both nostrils daily. 04/04/20  Yes Yu, Amy V, PA-C  cetirizine (ZYRTEC) 10 MG tablet Take 1 tablet (10 mg total) by mouth at bedtime. For nasal congestion/postnasal drainage Patient not taking: Reported on 12/28/2019 08/15/18 04/04/20  Cain Saupe, MD    Allergies: No Known Allergies  Review of Systems: Review of Systems  Constitutional: Negative for chills and fever.  Respiratory: Negative for shortness of breath.   Cardiovascular: Negative for chest pain.  Gastrointestinal: Positive for abdominal pain. Negative for constipation, diarrhea, nausea and vomiting.    Physical Exam BP 109/68   Pulse 65   Temp 98.6 F (37 C) (Oral)   Ht 5\' 3"  (1.6 m)   Wt 131 lb 12.8 oz (59.8 kg)   LMP 03/02/2021   SpO2 97%   BMI 23.35 kg/m  CONSTITUTIONAL: No acute distress HEENT:  Normocephalic, atraumatic, extraocular motion intact. RESPIRATORY:  Normal respiratory effort without pathologic use of accessory muscles. CARDIOVASCULAR:  Regular rhythm and rate. GI: The abdomen is soft, non-distended, with some discomfort to palpation in the center of the umbilicus.  On palpation, I can feel a firm area of tissue at the base of the umbilicus which would otherwise be more suspicious for scar tissue.  However, when she coughs, there's a palpable pressure with minor protrusion, suggestive of an early mild recurrence. NEUROLOGIC:  Motor and sensation is grossly normal.  Cranial nerves are grossly intact. PSYCH:  Alert and oriented to person, place and time. Affect is normal.  Labs/Imaging: None  Assessment and Plan: This is a 30 y.o. female with possibly early mild recurrence of umbilical hernia.  --The patient has some symptoms of discomfort when doing heavier activities.  She however, reports that when she's less active, there's also less discomfort.  Discussed with her that she  could have a mild early recurrence of her hernia.  Given her symptoms, it would be reasonable to proceed with repair, as this hernia can worsen with time and more activity level.  For now, the patient would like to wait without surgery.  Discussed with her that if her symptoms worsen or she feels the hernia bulges more, she should contact us so we can re-evaluate  her and discuss surgery.  Briefly mentioned that if we do surgery, would be minimally invasive approach.   --Return precautions given.  She will contact us if there's any worsening and/or when she's ready for surgery.  Face-to-face time spent with the patient and care providers was 25 minutes, with more than 50% of the time spent counseling, educating, and coordinating care of the patient.     Howie Ill, MD [facility]

## 2021-04-01 ENCOUNTER — Ambulatory Visit
Admission: EM | Admit: 2021-04-01 | Discharge: 2021-04-01 | Disposition: A | Discharge status: Home / Self Care | Payer: Self-pay | Attending: Family Medicine | Admitting: Family Medicine

## 2021-04-01 ENCOUNTER — Other Ambulatory Visit: Payer: Self-pay

## 2021-04-01 ENCOUNTER — Ambulatory Visit
Admission: RE | Admit: 2021-04-01 | Discharge: 2021-04-01 | Disposition: A | Discharge status: Home / Self Care | Payer: Self-pay | Source: Ambulatory Visit

## 2021-04-01 DIAGNOSIS — J22 Unspecified acute lower respiratory infection: Secondary | ICD-10-CM

## 2021-04-01 DIAGNOSIS — J45901 Unspecified asthma with (acute) exacerbation: Secondary | ICD-10-CM

## 2021-04-01 DIAGNOSIS — R059 Cough, unspecified: Secondary | ICD-10-CM

## 2021-04-01 LAB — POCT URINE PREGNANCY: Preg Test, Ur: NEGATIVE

## 2021-04-01 MED ORDER — PREDNISONE 20 MG PO TABS: 40.0000 mg | ORAL_TABLET | Freq: Every day | ORAL | 0 refills | Status: DC

## 2021-04-01 MED ORDER — PROMETHAZINE-DM 6.25-15 MG/5ML PO SYRP: 5.0000 mL | ORAL_SOLUTION | Freq: Four times a day (QID) | ORAL | 0 refills | Status: DC | PRN

## 2021-04-01 MED ORDER — DOXYCYCLINE HYCLATE 100 MG PO CAPS: 100.0000 mg | ORAL_CAPSULE | Freq: Two times a day (BID) | ORAL | 0 refills | Status: DC

## 2021-04-01 MED ORDER — DEXAMETHASONE 1 MG/ML PO CONC
10.0000 mg | Freq: Once | ORAL | Status: AC
  Administered 2021-04-01: 10 mg via ORAL

## 2021-04-01 MED ORDER — ALBUTEROL SULFATE HFA 108 (90 BASE) MCG/ACT IN AERS
2.0000 | INHALATION_SPRAY | Freq: Once | RESPIRATORY_TRACT | Status: AC
  Administered 2021-04-01: 2 via RESPIRATORY_TRACT

## 2021-04-01 NOTE — ED Provider Notes (Signed)
[name]    CSN: [id number] Arrival date & time: 04/01/21  1755      History   Chief Complaint No chief complaint on file.   HPI Alexandra Hardy is a 30 y.o. female.   HPI  Patient with a history of recurrent asthma presents today with an asthma exacerbation, shortness of breath, wheezing, chest tightness x2 weeks.  She has Attempted use of her albuterol inhaler only once and did not achieve relief.  She has not taken any additional medicine.  She is scheduled to see her primary care doctor next week.  She is not followed by a specialist because she is uninsured.  She declines COVID testing today as she is cash pay due to the cost of testing.  She endorses some back and neck aching along with chest tightness with deep breathing.  She has been afebrile throughout the course of illness.  Past Medical History:  Diagnosis Date  . Gestational diabetes   . History of HPV infection     Patient Active Problem List   Diagnosis Date Noted  . Umbilical hernia without obstruction and without gangrene   . Normal labor 04/12/2018  . SVD (spontaneous vaginal delivery) 04/12/2018  . History of gestational diabetes in prior pregnancy, currently pregnant 11/02/2017  . Supervision of high risk pregnancy, antepartum, first trimester 10/05/2017  . Pap smear of cervix shows high risk HPV present 05/14/2015  . Language barrier affecting health care 03/12/2015    Past Surgical History:  Procedure Laterality Date  . UMBILICAL HERNIA REPAIR N/A 05/27/2020   Procedure: HERNIA REPAIR UMBILICAL ADULT, Open with mesh;  Surgeon: Henrene Dodge, MD;  Location: [facility] ORS;  Service: General;  Laterality: N/A;    OB History    Gravida  2   Para  2   Term  2   Preterm      AB      Living  2     SAB      IAB      Ectopic      Multiple  0   Live Births  2            Home Medications    Prior to Admission medications   Medication Sig Start Date End Date  Taking? Authorizing Provider  acetaminophen (TYLENOL) 325 MG tablet Take 650 mg by mouth every 6 (six) hours as needed for moderate pain or headache.     [provider]  albuterol (PROVENTIL HFA;VENTOLIN HFA) 108 (90 Base) MCG/ACT inhaler Inhale 2 puffs into the lungs every 6 (six) hours as needed for wheezing or shortness of breath. Or cough 09/05/18   Fulp, Cammie, MD  fexofenadine (ALLEGRA) 180 MG tablet Take 180 mg by mouth daily.     [provider]  cetirizine (ZYRTEC) 10 MG tablet Take 1 tablet (10 mg total) by mouth at bedtime. For nasal congestion/postnasal drainage Patient not taking: Reported on 12/28/2019 08/15/18 04/04/20  Cain Saupe, MD    Family History Family History  Problem Relation Age of Onset  . Diabetes Father     Social History Social History   Tobacco Use  . Smoking status: Never Smoker  . Smokeless tobacco: Never Used  Substance Use Topics  . Alcohol use: Yes    Comment: occasional   . Drug use: No     Allergies   Patient has no known allergies.   Review of Systems Review of Systems Pertinent negatives listed in HPI  Physical Exam Triage Vital Signs ED Triage Vitals  Enc Vitals Group     BP 04/01/21 1843 127/71     Pulse Rate 04/01/21 1843 77     Resp 04/01/21 1843 20     Temp 04/01/21 1843 98.1 F (36.7 C)     Temp Source 04/01/21 1843 Oral     SpO2 04/01/21 1843 95 %     Weight --      Height --      Head Circumference --      Peak Flow --      Pain Score 04/01/21 1848 8     Pain Loc --      Pain Edu? --      Excl. in GC? --    No data found.  Updated Vital Signs BP 127/71 (BP Location: Left Arm)   Pulse 77   Temp 98.1 F (36.7 C) (Oral)   Resp 20   LMP 02/26/2021   SpO2 95%   Visual Acuity Right Eye Distance:   Left Eye Distance:   Bilateral Distance:    Right Eye Near:   Left Eye Near:    Bilateral Near:     Physical Exam General appearance: alert, Ill-appearing, no distress Head:  Normocephalic, without obvious abnormality, atraumatic ENT: External ears normal, nares with mucosal edema, congestion, oropharynx w/o exudate or swelling Respiratory: Respirations even , unlabored, coarse lung sound, +wheeze, accessory muscle use Heart: rate and rhythm normal. No gallop or murmurs noted on exam  Abdomen: BS +, no distention, no rebound tenderness, or no mass Extremities: No gross deformities Skin: Skin color, texture, turgor normal. No rashes seen  Psych: Appropriate mood and affect. Neurologic: GCS 15, normal coordination, normal gait  UC Treatments / Results  Labs (all labs ordered are listed, but only abnormal results are displayed) Labs Reviewed - No data to display  EKG   Radiology No results found.  Procedures Procedures (including critical care time)  Medications Ordered in UC Medications - No data to display  Initial Impression / Assessment and Plan / UC Course  I have reviewed the triage vital signs and the nursing notes.  Pertinent labs & imaging results that were available during my care of the patient were reviewed by me and considered in my medical decision making (see chart for details).     Asthma exacerbation, with cough with secondary lower respiratory infection. Treating today with oral Decadron 10 mg here in clinic along with 2 puffs of an albuterol inhaler. Treatment per discharge instructions.  Ibuprofen and Tylenol as needed.  Encouraged use of albuterol inhaler if any shortness of breath or wheezing develops.  Follow-up with primary care doctor to discuss a referral to pulmonology given chronicity of symptoms.  Patient verbalized understanding agreement with plan   Final Clinical Impressions(s) / UC Diagnoses   Final diagnoses:  Exacerbation of asthma, unspecified asthma severity, unspecified whether persistent  Cough  Lower respiratory infection (e.g., bronchitis, pneumonia, pneumonitis, pulmonitis)     Discharge Instructions      Recommend taking a home COVID test.  Start oral prednisone on tomorrow.  Start your antibiotic doxycycline today.  For cough Promethazine DM.  If at any point your symptoms worsen or do not improve go immediately to the ER.    ED Prescriptions    Medication Sig Dispense Auth. Provider   doxycycline (VIBRAMYCIN) 100 MG capsule Take 1 capsule (100 mg total) by mouth 2 (two) times daily. 20 capsule Bing Neighbors, FNP  predniSONE (DELTASONE) 20 MG tablet Take 2 tablets (40 mg total) by mouth daily with breakfast. 10 tablet Bing Neighbors, FNP   promethazine-dextromethorphan (PROMETHAZINE-DM) 6.25-15 MG/5ML syrup Take 5 mLs by mouth 4 (four) times daily as needed for cough. 140 mL Bing Neighbors, FNP     PDMP not reviewed this encounter.   Bing Neighbors, FNP 04/01/21 1929

## 2021-04-01 NOTE — Discharge Instructions (Signed)
Recommend taking a home COVID test.  Start oral prednisone on tomorrow.  Start your antibiotic doxycycline today.  For cough Promethazine DM.  If at any point your symptoms worsen or do not improve go immediately to the ER.

## 2021-04-01 NOTE — ED Triage Notes (Signed)
Pt c/o cough x2wk. States now having wheezing on exertion with chest tightness. States cough is worse at night.

## 2021-04-09 ENCOUNTER — Ambulatory Visit: Payer: Self-pay | Admitting: Internal Medicine

## 2021-04-24 ENCOUNTER — Encounter: Payer: Self-pay | Admitting: Internal Medicine

## 2021-07-31 ENCOUNTER — Ambulatory Visit (LOCAL_COMMUNITY_HEALTH_CENTER): Payer: Self-pay

## 2021-07-31 ENCOUNTER — Other Ambulatory Visit: Payer: Self-pay

## 2021-07-31 DIAGNOSIS — Z111 Encounter for screening for respiratory tuberculosis: Secondary | ICD-10-CM

## 2021-08-03 ENCOUNTER — Other Ambulatory Visit: Payer: Self-pay

## 2021-08-03 ENCOUNTER — Ambulatory Visit (LOCAL_COMMUNITY_HEALTH_CENTER): Payer: Self-pay

## 2021-08-03 DIAGNOSIS — Z111 Encounter for screening for respiratory tuberculosis: Secondary | ICD-10-CM

## 2021-08-03 LAB — TB SKIN TEST
Induration: 0 mm
TB Skin Test: NEGATIVE

## 2021-08-04 ENCOUNTER — Ambulatory Visit: Payer: Self-pay | Admitting: Critical Care Medicine

## 2021-08-04 NOTE — Progress Notes (Deleted)
Established Patient Office Visit  Subjective:  Patient ID: Alexandra Hardy, female    DOB: December 31, 1990  Age: 30 y.o. MRN: [id number]  CC: No chief complaint on file.   HPI Alexandra Hardy presents for ***  Past Medical History:  Diagnosis Date   Gestational diabetes    History of HPV infection     Past Surgical History:  Procedure Laterality Date   UMBILICAL HERNIA REPAIR N/A 05/27/2020   Procedure: HERNIA REPAIR UMBILICAL ADULT, Open with mesh;  Surgeon: Henrene Dodge, MD;  Location: [facility] ORS;  Service: General;  Laterality: N/A;    Family History  Problem Relation Age of Onset   Diabetes Father     Social History   Socioeconomic History   Marital status: Single    Spouse name: Not on file   Number of children: Not on file   Years of education: Not on file   Highest education level: Not on file  Occupational History   Not on file  Tobacco Use   Smoking status: Never   Smokeless tobacco: Never  Substance and Sexual Activity   Alcohol use: Yes    Comment: occasional    Drug use: No   Sexual activity: Not Currently    Birth control/protection: Condom, None  Other Topics Concern   Not on file  Social History Narrative   Not on file   Social Determinants of Health   Financial Resource Strain: Not on file  Food Insecurity: Not on file  Transportation Needs: Not on file  Physical Activity: Not on file  Stress: Not on file  Social Connections: Not on file  Intimate Partner Violence: Not on file    Outpatient Medications Prior to Visit  Medication Sig Dispense Refill   acetaminophen (TYLENOL) 325 MG tablet Take 650 mg by mouth every 6 (six) hours as needed for moderate pain or headache.      albuterol (PROVENTIL HFA;VENTOLIN HFA) 108 (90 Base) MCG/ACT inhaler Inhale 2 puffs into the lungs every 6 (six) hours as needed for wheezing or shortness of breath. Or cough 1 Inhaler 6   doxycycline (VIBRAMYCIN) 100 MG capsule Take 1 capsule  (100 mg total) by mouth 2 (two) times daily. 20 capsule 0   fexofenadine (ALLEGRA) 180 MG tablet Take 180 mg by mouth daily.      predniSONE (DELTASONE) 20 MG tablet Take 2 tablets (40 mg total) by mouth daily with breakfast. 10 tablet 0   promethazine-dextromethorphan (PROMETHAZINE-DM) 6.25-15 MG/5ML syrup Take 5 mLs by mouth 4 (four) times daily as needed for cough. 140 mL 0   No facility-administered medications prior to visit.    No Known Allergies  ROS Review of Systems    Objective:    Physical Exam  There were no vitals taken for this visit. Wt Readings from Last 3 Encounters:  03/04/21 131 lb 12.8 oz (59.8 kg)  06/03/20 128 lb 6.4 oz (58.2 kg)  05/27/20 125 lb (56.7 kg)     Health Maintenance Due  Topic Date Due   COVID-19 Vaccine (1) Never done   PNEUMOCOCCAL POLYSACCHARIDE VACCINE AGE 65-64 HIGH RISK  Never done   OPHTHALMOLOGY EXAM  Never done   URINE MICROALBUMIN  Never done   Hepatitis C Screening  Never done   PAP SMEAR-Modifier  03/11/2018   HEMOGLOBIN A1C  06/26/2020   INFLUENZA VACCINE  06/22/2021    There are no preventive care reminders to display for this patient.  Lab Results  Component Value Date  TSH 1.180 12/28/2019   Lab Results  Component Value Date   WBC 5.8 06/01/2020   HGB 13.9 06/01/2020   HCT 41.2 06/01/2020   MCV 94.3 06/01/2020   PLT 224 06/01/2020   Lab Results  Component Value Date   NA 137 06/01/2020   K 4.2 06/01/2020   CO2 23 06/01/2020   GLUCOSE 157 (H) 06/01/2020   BUN 14 06/01/2020   CREATININE 0.64 06/01/2020   BILITOT 0.5 06/01/2020   ALKPHOS 62 06/01/2020   AST 18 06/01/2020   ALT 20 06/01/2020   PROT 7.5 06/01/2020   ALBUMIN 3.8 06/01/2020   CALCIUM 9.1 06/01/2020   ANIONGAP 10 06/01/2020   No results found for: CHOL No results found for: HDL No results found for: LDLCALC No results found for: TRIG No results found for: CHOLHDL Lab Results  Component Value Date   HGBA1C 5.3 12/28/2019       Assessment & Plan:   Problem List Items Addressed This Visit   None   No orders of the defined types were placed in this encounter.   Follow-up: No follow-ups on file.    Shan Levans, MD

## 2022-01-12 ENCOUNTER — Encounter: Payer: Self-pay | Admitting: Radiology

## 2022-01-20 ENCOUNTER — Other Ambulatory Visit: Payer: Self-pay

## 2022-01-20 ENCOUNTER — Ambulatory Visit (INDEPENDENT_AMBULATORY_CARE_PROVIDER_SITE_OTHER): Payer: Self-pay | Admitting: Radiology

## 2022-01-20 ENCOUNTER — Ambulatory Visit: Payer: Self-pay

## 2022-01-20 ENCOUNTER — Encounter: Payer: Self-pay | Admitting: Radiology

## 2022-01-20 VITALS — BP 110/64 | Ht 62.0 in | Wt 131.0 lb

## 2022-01-20 DIAGNOSIS — N63 Unspecified lump in unspecified breast: Secondary | ICD-10-CM

## 2022-01-20 NOTE — Progress Notes (Signed)
? ?  [facility] [birth year] [id number] ? ? ?History:  31 y.o. G2P2 (accompanied by [place] Interpreter, Dineen Kid) presents with complaints of right breast mass x 1 week. Area is the size of a grape per patient and is tender. ? ?Gynecologic History ?Patient's last menstrual period was 01/18/2022. ?Period Cycle (Days): 28 ?Period Duration (Days): 6 ?Period Pattern: Regular ?Dysmenorrhea: (!) Severe (varies mild-severe cramping) ?Contraception/Family planning: none ?Last mammogram: never.  ? ?Obstetric History ?OB History  ?Gravida Para Term Preterm AB Living  ?2 2 2     2   ?SAB IAB Ectopic Multiple Live Births  ?      0 2  ?  ?# Outcome Date GA Lbr Len/2nd Weight Sex Delivery Anes PTL Lv  ?2 Term 04/12/18 [redacted]w[redacted]d 04:51 / 00:36 8 lb 7.1 oz (3.83 kg) M Vag-Spont EPI  LIV  ?1 Term 09/29/15 [redacted]w[redacted]d 06:24 / 03:42 7 lb 10.8 oz (3.481 kg) F Vag-Spont EPI  LIV  ? ? ? ?The following portions of the patient's history were reviewed and updated as appropriate: allergies, current medications, past family history, past medical history, past social history, past surgical history, and problem list. ? ?Review of Systems ?Pertinent items noted in HPI and remainder of comprehensive ROS otherwise negative.  ? ?Past medical history, past surgical history, family history and social history were all reviewed and documented in the EPIC chart. ? ? ?Exam: ? ?Vitals:  ? 01/20/22 1554  ?BP: 110/64  ?Weight: 131 lb (59.4 kg)  ?Height: 5\' 2"  (1.575 m)  ? ?Body mass index is 23.96 kg/m?. ? ?General appearance:  Normal ?Thyroid:  Symmetrical, normal in size, without palpable masses or nodularity. ?Respiratory ? Auscultation:  Clear without wheezing or rhonchi ?Cardiovascular ? Auscultation:  Regular rate, without rubs, murmurs or gallops ? Edema/varicosities:  Not grossly evident ?Breasts: no skin or nipple changes or axillary nodes, left breast normal without mass, skin or nipple changes or axillary nodes, abnormal mass palpable  right breast 3 o'clock adjacent to nipple.  ? ?Patient informed chaperone available to be present for breast exam. Patient has requested no chaperone to be present.  ? ?Assessment/Plan:   ?Breast mass ? Will schedule mammogram with u/s ? ?Of note- pt had abnormal pap (HPV+) 2016 with no follow up. Will schedule AEX with pap. ? ? ?Arlie Solomons B WHNP-BC 4:16 PM 01/20/2022  ?

## 2022-01-21 ENCOUNTER — Telehealth: Payer: Self-pay | Admitting: *Deleted

## 2022-01-21 DIAGNOSIS — N63 Unspecified lump in unspecified breast: Secondary | ICD-10-CM

## 2022-01-21 NOTE — Telephone Encounter (Signed)
-----   Message from Tanda Rockers, NP sent at 01/20/2022  4:18 PM EST ----- ?Regarding: Mammogram ?Please schedule diagnostic mammogram and possible u/s. Right breast mass 3 oclock, 2cm, mobile, tender. ? ?Thanks ? ?

## 2022-01-21 NOTE — Telephone Encounter (Signed)
Orders placed at breast center, patient is self pay. Message sent to [facility] at the [facility]  clinic to call help assist with scheduling. ?

## 2022-01-27 ENCOUNTER — Other Ambulatory Visit: Payer: Self-pay

## 2022-01-27 DIAGNOSIS — N6315 Unspecified lump in the right breast, overlapping quadrants: Secondary | ICD-10-CM

## 2022-01-27 NOTE — Telephone Encounter (Signed)
Patient scheduled on 02/04/22 at the breast center. ?

## 2022-01-28 ENCOUNTER — Ambulatory Visit: Payer: Self-pay | Admitting: *Deleted

## 2022-01-28 ENCOUNTER — Other Ambulatory Visit: Payer: Self-pay

## 2022-01-28 VITALS — BP 116/78 | Wt 132.8 lb

## 2022-01-28 DIAGNOSIS — N6312 Unspecified lump in the right breast, upper inner quadrant: Secondary | ICD-10-CM

## 2022-01-28 DIAGNOSIS — N644 Mastodynia: Secondary | ICD-10-CM

## 2022-01-28 DIAGNOSIS — Z01419 Encounter for gynecological examination (general) (routine) without abnormal findings: Secondary | ICD-10-CM

## 2022-01-28 NOTE — Progress Notes (Signed)
[name] is a 31 y.o. [id number] female who presents to [facility] clinic today with complaint of right breast lump and pain x 2 week. Patient stated the lump and pain has decreased over the past week. Patient rates the pain at a 8 out of 10 initially and now a 1 out of 10.  ?  ?Pap Smear: Pap smear completed today. Last Pap smear was 03/12/2015 at [place] clinic and was normal with positive HPV. Per patient has no history of an abnormal Pap smear. Last Pap smear result is available in Epic. ?  ?Physical exam: ?Breasts ?Breasts symmetrical. No skin abnormalities bilateral breasts. No nipple retraction bilateral breasts. No nipple discharge bilateral breasts. No lymphadenopathy. No lumps palpated left breast. Palpated a right breast lump versus thickened skin at 2 o'clock next to the areola. Complaints of pain when palpated right breast lump versus thickened skin. ?  ?Pelvic/Bimanual ?Ext Genitalia ?No lesions, no swelling and no discharge observed on external genitalia.      ?  ?Vagina ?Vagina pink and normal texture. No lesions or discharge observed in vagina.      ?  ?Cervix ?Cervix is present. Cervix pink and of normal texture. No discharge observed.  ?  ?Uterus ?Uterus is present and palpable. Uterus in normal position and normal size.      ?  ?Adnexae ?Bilateral ovaries present and palpable. No tenderness on palpation.       ?  ?Rectovaginal ?No rectal exam completed today since patient had no rectal complaints. No skin abnormalities observed on exam.   ?  ?Smoking History: ?Patient has never smoked. ?  ?Patient Navigation: ?Patient education provided. Access to services provided for patient through [place] program. Spanish interpreter Alexandra Hardy from [facility] provided.  ?  ?Breast and Cervical Cancer Risk Assessment: ?Patient does not have family history of breast cancer, known genetic mutations, or radiation treatment to the chest before age 70. Patient does  not have history of cervical dysplasia, immunocompromised, or DES exposure in-utero. Breast cancer risk assessment completed. No breast cancer risk calculated due to patient is less than 22 years old. ? ?Risk Assessment   ? ? Risk Scores   ? ?   01/28/2022  ? Last edited by: Alexandra Hardy, CMA  ? 5-year risk:   ? Lifetime risk:   ? ?  ?  ? ?  ? ? ?A: ?B[facility]CP exam with pap smear ?Complaint of right breast lump and pain. ? ?P: ?[facility]. Appointment scheduled Thursday, February 04, 2022 at [place]. ? ?Alexandra Parish, RN ?01/28/2022 1:45 PM   ?

## 2022-01-28 NOTE — Patient Instructions (Signed)
Explained breast self awareness with [name]. Pap smear completed today. Let her know that her next Pap smear will be due in one year if normal with negative HPV due to her last Pap smear was HPV positive. Referred [facility] to [facility] for a diagnostic mammogram. Appointment scheduled Thursday, February 04, 2022 at 0940. Patient aware of appointment and will be there. Let patient know will follow up with her within the next couple weeks with results of her Pap smear by phone. [name] verbalized understanding. ? ?Vessie Olmsted, Kathaleen Maser, RN ?1:45 PM ? ? ? ? ?

## 2022-01-29 LAB — CYTOLOGY - PAP
Comment: NEGATIVE
Diagnosis: NEGATIVE
High risk HPV: NEGATIVE

## 2022-02-01 ENCOUNTER — Telehealth: Payer: Self-pay

## 2022-02-01 NOTE — Telephone Encounter (Signed)
Attempted to reach patient via Edwinna Areola, [employer] at both numbers to give pap results but unable to leave voicemail. Will try back at another time. ?

## 2022-02-01 NOTE — Telephone Encounter (Addendum)
Called patient via [employer] [phone]. Informed patient that pap smear was normal and HPV was negative. Based on this result and her previous hx her next pap smear will be due in 1 year. Patient voiced understanding.  ?

## 2022-02-04 ENCOUNTER — Ambulatory Visit
Admission: RE | Admit: 2022-02-04 | Discharge: 2022-02-04 | Disposition: A | Discharge status: Home / Self Care | Payer: Self-pay | Source: Ambulatory Visit | Attending: Obstetrics and Gynecology | Admitting: Obstetrics and Gynecology

## 2022-02-04 ENCOUNTER — Ambulatory Visit
Admission: RE | Admit: 2022-02-04 | Discharge: 2022-02-04 | Disposition: A | Discharge status: Home / Self Care | Payer: No Typology Code available for payment source | Source: Ambulatory Visit | Attending: Obstetrics and Gynecology | Admitting: Obstetrics and Gynecology

## 2022-02-04 ENCOUNTER — Other Ambulatory Visit: Payer: Self-pay

## 2022-02-04 NOTE — Progress Notes (Deleted)
? ? ? ? ?  Subjective: Alexandra Hardy is a 31 y.o. female who complains of ? ? ?- Sexually active with *** *** partner(s) ?- Last sexual encounter: *** ?-Contraception: *** ? ?Review of Systems  ? ?Objective: ? ?-Vulva: without lesions or discharge ?-Vagina: discharge present, aptima swab and wet prep obtained ?-Cervix: no lesion or discharge, no CMT ?-Perineum: no lesions ?-Uterus: Mobile, non tender ?-Adnexa: no masses or tenderness ? ? ?Microscopic wet-mount exam shows {test wet mount:315123}.  ? ?Chaperone offered and declined. ? ?Assessment:/Plan:  ? ?There are no diagnoses linked to this encounter.  ? ?Will contact patient with results of testing completed today. Avoid intercourse until symptoms are resolved. Safe sex encouraged. Avoid the use of soaps or perfumed products in the peri area. Avoid tub baths and sitting in sweaty or wet clothing for prolonged periods of time.  ? ?

## 2022-02-08 NOTE — Progress Notes (Deleted)
? ?New Patient Office Visit ? ?Subjective:  ?Patient ID: Alexandra Hardy, female    DOB: 04-14-1991  Age: 31 y.o. MRN: [id number] ? ?CC: No chief complaint on file. ? ? ?HPI ?Alexandra Hardy presents for new patient visit to establish care.  Introduced to Publishing rights manager role and practice setting.  All questions answered.  Discussed provider/patient relationship and expectations. ? ? ?Past Medical History:  ?Diagnosis Date  ? Gestational diabetes   ? History of HPV infection   ? ? ?Past Surgical History:  ?Procedure Laterality Date  ? UMBILICAL HERNIA REPAIR N/A 05/27/2020  ? Procedure: HERNIA REPAIR UMBILICAL ADULT, Open with mesh;  Surgeon: Henrene Dodge, MD;  Location: [facility] ORS;  Service: General;  Laterality: N/A;  ? ? ?Family History  ?Problem Relation Age of Onset  ? Diabetes Father   ? Breast cancer Neg Hx   ? ? ?Social History  ? ?Socioeconomic History  ? Marital status: Single  ?  Spouse name: Not on file  ? Number of children: Not on file  ? Years of education: Not on file  ? Highest education level: Not on file  ?Occupational History  ? Not on file  ?Tobacco Use  ? Smoking status: Never  ? Smokeless tobacco: Never  ?Substance and Sexual Activity  ? Alcohol use: Yes  ?  Comment: occasional   ? Drug use: No  ? Sexual activity: Yes  ?  Birth control/protection: Condom  ?Other Topics Concern  ? Not on file  ?Social History Narrative  ? Not on file  ? ?Social Determinants of Health  ? ?Financial Resource Strain: Not on file  ?Food Insecurity: No Food Insecurity  ? Worried About Programme researcher, broadcasting/film/video in the Last Year: Never true  ? Ran Out of Food in the Last Year: Never true  ?Transportation Needs: No Transportation Needs  ? Lack of Transportation (Medical): No  ? Lack of Transportation (Non-Medical): No  ?Physical Activity: Not on file  ?Stress: Not on file  ?Social Connections: Not on file  ?Intimate Partner Violence: Not on file  ? ? ?ROS ?Review of Systems ? ?Objective:   ? ?Today's Vitals: LMP 01/18/2022 (Exact Date)  ? ?Physical Exam ? ?Assessment & Plan:  ? ?Problem List Items Addressed This Visit   ?None ? ? ?Outpatient Encounter Medications as of 02/09/2022  ?Medication Sig  ? acetaminophen (TYLENOL) 325 MG tablet Take 650 mg by mouth every 6 (six) hours as needed for moderate pain or headache.  (Patient not taking: Reported on 01/20/2022)  ? albuterol (PROVENTIL HFA;VENTOLIN HFA) 108 (90 Base) MCG/ACT inhaler Inhale 2 puffs into the lungs every 6 (six) hours as needed for wheezing or shortness of breath. Or cough (Patient not taking: Reported on 01/20/2022)  ? BL EVENING PRIMROSE OIL PO Take by mouth.  ? Cyanocobalamin (B-12 PO) Take by mouth.  ? doxycycline (VIBRAMYCIN) 100 MG capsule Take 1 capsule (100 mg total) by mouth 2 (two) times daily. (Patient not taking: Reported on 01/20/2022)  ? fexofenadine (ALLEGRA) 180 MG tablet Take 180 mg by mouth daily.  (Patient not taking: Reported on 01/20/2022)  ? predniSONE (DELTASONE) 20 MG tablet Take 2 tablets (40 mg total) by mouth daily with breakfast. (Patient not taking: Reported on 01/20/2022)  ? promethazine-dextromethorphan (PROMETHAZINE-DM) 6.25-15 MG/5ML syrup Take 5 mLs by mouth 4 (four) times daily as needed for cough. (Patient not taking: Reported on 01/20/2022)  ? [DISCONTINUED] cetirizine (ZYRTEC) 10 MG tablet Take 1 tablet (10 mg total) by  mouth at bedtime. For nasal congestion/postnasal drainage (Patient not taking: Reported on 12/28/2019)  ? ?No facility-administered encounter medications on file as of 02/09/2022.  ? ? ?Follow-up: No follow-ups on file.  ? ?Gerre Scull, NP ? ?

## 2022-02-09 ENCOUNTER — Telehealth: Payer: Self-pay

## 2022-02-09 ENCOUNTER — Ambulatory Visit: Payer: Self-pay | Admitting: Radiology

## 2022-02-09 ENCOUNTER — Ambulatory Visit: Payer: Self-pay | Admitting: Nurse Practitioner

## 2022-02-09 DIAGNOSIS — Z0289 Encounter for other administrative examinations: Secondary | ICD-10-CM

## 2022-02-09 NOTE — Telephone Encounter (Signed)
Patient/Caregiver was notified of No Show/Late Cancellation Policy & possible $50 charge. ?Visit was cancelled with reason "No Show/Cancel within 24 hours" for tracking & charging. ? ?Caller Name: Alexandra Hardy [facility]  ?Caller Ph #: [phone] ?Date of APPT: 02/09/22 ?Reason given for no show/late cancellation: no show no reason  ?No Show Letter printed & put in outgoing mail (Yes/No): yes ? ?~~~Route message to admin supervisor and clinical team/CMA~~~ ? ?  ?

## 2022-02-10 ENCOUNTER — Ambulatory Visit: Payer: Self-pay | Admitting: Radiology

## 2022-02-18 NOTE — Telephone Encounter (Signed)
1st no show, waived fee, letter sent KO ?

## 2022-03-04 ENCOUNTER — Ambulatory Visit
Admission: RE | Admit: 2022-03-04 | Discharge: 2022-03-04 | Disposition: A | Discharge status: Home / Self Care | Payer: No Typology Code available for payment source | Source: Ambulatory Visit | Attending: Internal Medicine | Admitting: Internal Medicine

## 2022-03-04 ENCOUNTER — Other Ambulatory Visit: Payer: Self-pay

## 2022-03-04 VITALS — BP 97/74 | HR 90 | Temp 98.6°F | Resp 18 | Ht 62.0 in | Wt 132.7 lb

## 2022-03-04 DIAGNOSIS — J020 Streptococcal pharyngitis: Secondary | ICD-10-CM

## 2022-03-04 LAB — POCT RAPID STREP A (OFFICE): Rapid Strep A Screen: POSITIVE — AB

## 2022-03-04 MED ORDER — AMOXICILLIN 500 MG PO CAPS: 500.0000 mg | ORAL_CAPSULE | Freq: Two times a day (BID) | ORAL | 0 refills | Status: AC

## 2022-03-04 NOTE — ED Triage Notes (Signed)
Patient's niece explained patient began with fever and sore throat since yesterday.  Fever has gone but sore throat is still there.  Patient has been unable to swallow or eat anything.  Patient has taken medication similar to Tylenol. ?

## 2022-03-04 NOTE — ED Provider Notes (Signed)
?[name] ? ? ? ?CSN: [id number] ?Arrival date & time: 03/04/22  1646 ? ? ?  ? ?History   ?Chief Complaint ?Chief Complaint  ?Patient presents with  ? Fever  ?  Muscle pain - Entered by patient  ? Appointment  ? ? ?HPI ?Alexandra Hardy is a 31 y.o. female.  ? ?Patient presents with fever, body aches, sore throat that started yesterday.  Patient not sure of Tmax at home but states that she felt feverish.  Denies any known sick contacts.  Denies any associated upper respiratory symptoms or cough.  Denies chest pain, shortness of breath, nausea, vomiting, diarrhea, abdominal pain. patient has taken Tylenol for symptoms with minimal improvement. ? ? ?Fever ? ?Past Medical History:  ?Diagnosis Date  ? Gestational diabetes   ? History of HPV infection   ? ? ?Patient Active Problem List  ? Diagnosis Date Noted  ? Umbilical hernia without obstruction and without gangrene   ? Normal labor 04/12/2018  ? SVD (spontaneous vaginal delivery) 04/12/2018  ? History of gestational diabetes in prior pregnancy, currently pregnant 11/02/2017  ? Supervision of high risk pregnancy, antepartum, first trimester 10/05/2017  ? Pap smear of cervix shows high risk HPV present 05/14/2015  ? Language barrier affecting health care 03/12/2015  ? ? ?Past Surgical History:  ?Procedure Laterality Date  ? UMBILICAL HERNIA REPAIR N/A 05/27/2020  ? Procedure: HERNIA REPAIR UMBILICAL ADULT, Open with mesh;  Surgeon: Henrene DodgePiscoya, Jose, MD;  Location: [facility] ORS;  Service: General;  Laterality: N/A;  ? ? ?OB History   ? ? Gravida  ?2  ? Para  ?2  ? Term  ?2  ? Preterm  ?   ? AB  ?   ? Living  ?2  ?  ? ? SAB  ?   ? IAB  ?   ? Ectopic  ?   ? Multiple  ?0  ? Live Births  ?2  ?   ?  ?  ? ? ? ?Home Medications   ? ?Prior to Admission medications   ?Medication Sig Start Date End Date Taking? Authorizing Provider  ?amoxicillin (AMOXIL) 500 MG capsule Take 1 capsule (500 mg total) by mouth 2 (two) times daily for 10 days. 03/04/22 03/14/22 Yes  Gustavus BryantMound, Darlynn Ricco E, FNP  ?BL EVENING PRIMROSE OIL PO Take by mouth.   Yes [provider]  ?Cyanocobalamin (B-12 PO) Take by mouth.   Yes [provider]  ?acetaminophen (TYLENOL) 325 MG tablet Take 650 mg by mouth every 6 (six) hours as needed for moderate pain or headache.  ?Patient not taking: Reported on 01/20/2022    [provider]  ?albuterol (PROVENTIL HFA;VENTOLIN HFA) 108 (90 Base) MCG/ACT inhaler Inhale 2 puffs into the lungs every 6 (six) hours as needed for wheezing or shortness of breath. Or cough ?Patient not taking: Reported on 01/20/2022 09/05/18   Fulp, Hewitt Shortsammie, MD  ?fexofenadine (ALLEGRA) 180 MG tablet Take 180 mg by mouth daily.  ?Patient not taking: Reported on 01/20/2022    [provider]  ?predniSONE (DELTASONE) 20 MG tablet Take 2 tablets (40 mg total) by mouth daily with breakfast. ?Patient not taking: Reported on 01/20/2022 04/01/21   Bing NeighborsHarris, Kimberly S, FNP  ?promethazine-dextromethorphan (PROMETHAZINE-DM) 6.25-15 MG/5ML syrup Take 5 mLs by mouth 4 (four) times daily as needed for cough. ?Patient not taking: Reported on 01/20/2022 04/01/21   Bing NeighborsHarris, Kimberly S, FNP  ?cetirizine (ZYRTEC) 10 MG tablet Take 1 tablet (10 mg total) by mouth at  bedtime. For nasal congestion/postnasal drainage ?Patient not taking: Reported on 12/28/2019 08/15/18 04/04/20  Cain Saupe, MD  ? ? ?Family History ?Family History  ?Problem Relation Age of Onset  ? Diabetes Father   ? Breast cancer Neg Hx   ? ? ?Social History ?Social History  ? ?Tobacco Use  ? Smoking status: Never  ? Smokeless tobacco: Never  ?Substance Use Topics  ? Alcohol use: Yes  ?  Comment: occasional   ? Drug use: No  ? ? ? ?Allergies   ?Patient has no known allergies. ? ? ?Review of Systems ?Review of Systems ?Per HPI ? ?Physical Exam ?Triage Vital Signs ?ED Triage Vitals  ?Enc Vitals Group  ?   BP 03/04/22 1711 97/74  ?   Pulse Rate 03/04/22 1711 90  ?   Resp 03/04/22 1711 18  ?   Temp 03/04/22 1711 98.6 ?F (37 ?C)  ?   Temp  Source 03/04/22 1711 Oral  ?   SpO2 03/04/22 1711 98 %  ?   Weight 03/04/22 1713 132 lb 11.5 oz (60.2 kg)  ?   Height 03/04/22 1713 5\' 2"  (1.575 m)  ?   Head Circumference --   ?   Peak Flow --   ?   Pain Score 03/04/22 1713 8  ?   Pain Loc --   ?   Pain Edu? --   ?   Excl. in GC? --   ? ?No data found. ? ?Updated Vital Signs ?BP 97/74 (BP Location: Left Arm)   Pulse 90   Temp 98.6 ?F (37 ?C) (Oral)   Resp 18   Ht 5\' 2"  (1.575 m)   Wt 132 lb 11.5 oz (60.2 kg)   LMP 02/18/2022   SpO2 98%   BMI 24.27 kg/m?  ? ?Visual Acuity ?Right Eye Distance:   ?Left Eye Distance:   ?Bilateral Distance:   ? ?Right Eye Near:   ?Left Eye Near:    ?Bilateral Near:    ? ?Physical Exam ?Constitutional:   ?   General: She is not in acute distress. ?   Appearance: Normal appearance. She is not toxic-appearing or diaphoretic.  ?HENT:  ?   Head: Normocephalic and atraumatic.  ?   Right Ear: Tympanic membrane and ear canal normal.  ?   Left Ear: Tympanic membrane and ear canal normal.  ?   Nose: Nose normal.  ?   Mouth/Throat:  ?   Mouth: Mucous membranes are moist.  ?   Pharynx: Posterior oropharyngeal erythema present.  ?Eyes:  ?   Extraocular Movements: Extraocular movements intact.  ?   Conjunctiva/sclera: Conjunctivae normal.  ?   Pupils: Pupils are equal, round, and reactive to light.  ?Cardiovascular:  ?   Rate and Rhythm: Normal rate and regular rhythm.  ?   Pulses: Normal pulses.  ?   Heart sounds: Normal heart sounds.  ?Pulmonary:  ?   Effort: Pulmonary effort is normal. No respiratory distress.  ?   Breath sounds: Normal breath sounds. No stridor. No wheezing, rhonchi or rales.  ?Abdominal:  ?   General: Abdomen is flat. Bowel sounds are normal.  ?   Palpations: Abdomen is soft.  ?Musculoskeletal:     ?   General: Normal range of motion.  ?   Cervical back: Normal range of motion.  ?Skin: ?   General: Skin is warm and dry.  ?Neurological:  ?   General: No focal deficit present.  ?   Mental Status: She  is alert and oriented  to person, place, and time. Mental status is at baseline.  ?Psychiatric:     ?   Mood and Affect: Mood normal.     ?   Behavior: Behavior normal.  ? ? ? ?UC Treatments / Results  ?Labs ?(all labs ordered are listed, but only abnormal results are displayed) ?Labs Reviewed  ?POCT RAPID STREP A (OFFICE) - Abnormal; Notable for the following components:  ?    Result Value  ? Rapid Strep A Screen Positive (*)   ? All other components within normal limits  ? ? ?EKG ? ? ?Radiology ?No results found. ? ?Procedures ?Procedures (including critical care time) ? ?Medications Ordered in UC ?Medications - No data to display ? ?Initial Impression / Assessment and Plan / UC Course  ?I have reviewed the triage vital signs and the nursing notes. ? ?Pertinent labs & imaging results that were available during my care of the patient were reviewed by me and considered in my medical decision making (see chart for details). ? ?  ? ?Strep was positive.  Will treat with amoxicillin antibiotic.  No signs of peritonsillar abscess on exam.  Discussed supportive care.  Discussed return precautions.  Patient verbalized understanding and was agreeable with plan.  Patient declined interpreter throughout and wished for her family to interpret. ?Final Clinical Impressions(s) / UC Diagnoses  ? ?Final diagnoses:  ?Strep pharyngitis  ? ? ? ?Discharge Instructions   ? ?  ?You have strep throat which is being treated with an antibiotic.  Please follow-up if symptoms persist or worsen. ? ? ? ? ?ED Prescriptions   ? ? Medication Sig Dispense Auth. Provider  ? amoxicillin (AMOXIL) 500 MG capsule Take 1 capsule (500 mg total) by mouth 2 (two) times daily for 10 days. 20 capsule Ervin Knack E, [place]  ? ?  ? ?PDMP not reviewed this encounter. ?  ?Gustavus Bryant, [place] ?03/04/22 1744 ? ?

## 2022-03-04 NOTE — Discharge Instructions (Signed)
You have strep throat which is being treated with an antibiotic.  Please follow-up if symptoms persist or worsen. ?

## 2022-03-08 ENCOUNTER — Ambulatory Visit
Admission: EM | Admit: 2022-03-08 | Discharge: 2022-03-08 | Disposition: A | Discharge status: Home / Self Care | Payer: Self-pay | Attending: Emergency Medicine | Admitting: Emergency Medicine

## 2022-03-08 DIAGNOSIS — R051 Acute cough: Secondary | ICD-10-CM

## 2022-03-08 DIAGNOSIS — R062 Wheezing: Secondary | ICD-10-CM

## 2022-03-08 MED ORDER — BENZONATATE 100 MG PO CAPS: 100.0000 mg | ORAL_CAPSULE | Freq: Three times a day (TID) | ORAL | 0 refills | Status: DC

## 2022-03-08 MED ORDER — PROMETHAZINE-DM 6.25-15 MG/5ML PO SYRP: 5.0000 mL | ORAL_SOLUTION | Freq: Four times a day (QID) | ORAL | 0 refills | Status: DC | PRN

## 2022-03-08 MED ORDER — ALBUTEROL SULFATE (2.5 MG/3ML) 0.083% IN NEBU
2.5000 mg | INHALATION_SOLUTION | Freq: Once | RESPIRATORY_TRACT | Status: AC
  Administered 2022-03-08: 2.5 mg via RESPIRATORY_TRACT

## 2022-03-08 MED ORDER — PREDNISONE 20 MG PO TABS: 40.0000 mg | ORAL_TABLET | Freq: Every day | ORAL | 0 refills | Status: DC

## 2022-03-08 MED ORDER — ALBUTEROL SULFATE HFA 108 (90 BASE) MCG/ACT IN AERS: 2.0000 | INHALATION_SPRAY | RESPIRATORY_TRACT | 0 refills | Status: DC | PRN

## 2022-03-08 NOTE — ED Provider Notes (Signed)
?[facility] ? ? ? ?CSN: [id number] ?Arrival date & time: 03/08/22  0906 ? ? ?  ? ?History   ?Chief Complaint ?Chief Complaint  ?Patient presents with  ? Cough  ? ? ?HPI ?Alexandra Hardy is a 31 y.o. female.  ? ?She presents with a nonproductive persisting dry cough that began this morning approximately around 3 AM.  Endorses difficulty catching breath frequency of cough and intermittent wheezing.  Has not attempted treatment of symptoms.  Currently on amoxicillin for strep throat, took dosage this morning.  Denies chest pain or tightness, shortness of breath. ?Past Medical History:  ?Diagnosis Date  ? Gestational diabetes   ? History of HPV infection   ? ? ?Patient Active Problem List  ? Diagnosis Date Noted  ? Umbilical hernia without obstruction and without gangrene   ? Normal labor 04/12/2018  ? SVD (spontaneous vaginal delivery) 04/12/2018  ? History of gestational diabetes in prior pregnancy, currently pregnant 11/02/2017  ? Supervision of high risk pregnancy, antepartum, first trimester 10/05/2017  ? Pap smear of cervix shows high risk HPV present 05/14/2015  ? Language barrier affecting health care 03/12/2015  ? ? ?Past Surgical History:  ?Procedure Laterality Date  ? UMBILICAL HERNIA REPAIR N/A 05/27/2020  ? Procedure: HERNIA REPAIR UMBILICAL ADULT, Open with mesh;  Surgeon: Henrene Dodge, MD;  Location: [facility] ORS;  Service: General;  Laterality: N/A;  ? ? ?OB History   ? ? Gravida  ?2  ? Para  ?2  ? Term  ?2  ? Preterm  ?   ? AB  ?   ? Living  ?2  ?  ? ? SAB  ?   ? IAB  ?   ? Ectopic  ?   ? Multiple  ?0  ? Live Births  ?2  ?   ?  ?  ? ? ? ?Home Medications   ? ?Prior to Admission medications   ?Medication Sig Start Date End Date Taking? Authorizing Provider  ?amoxicillin (AMOXIL) 500 MG capsule Take 1 capsule (500 mg total) by mouth 2 (two) times daily for 10 days. 03/04/22 03/14/22 Yes Gustavus Bryant, FNP  ?acetaminophen (TYLENOL) 325 MG tablet Take 650 mg by mouth every 6 (six) hours  as needed for moderate pain or headache.  ?Patient not taking: Reported on 01/20/2022    [provider]  ?albuterol (PROVENTIL HFA;VENTOLIN HFA) 108 (90 Base) MCG/ACT inhaler Inhale 2 puffs into the lungs every 6 (six) hours as needed for wheezing or shortness of breath. Or cough ?Patient not taking: Reported on 01/20/2022 09/05/18   Fulp, Hewitt Shorts, MD  ?BL EVENING PRIMROSE OIL PO Take by mouth.    [provider]  ?Cyanocobalamin (B-12 PO) Take by mouth.    [provider]  ?fexofenadine (ALLEGRA) 180 MG tablet Take 180 mg by mouth daily.  ?Patient not taking: Reported on 01/20/2022    [provider]  ?predniSONE (DELTASONE) 20 MG tablet Take 2 tablets (40 mg total) by mouth daily with breakfast. ?Patient not taking: Reported on 01/20/2022 04/01/21   Bing Neighbors, FNP  ?promethazine-dextromethorphan (PROMETHAZINE-DM) 6.25-15 MG/5ML syrup Take 5 mLs by mouth 4 (four) times daily as needed for cough. ?Patient not taking: Reported on 01/20/2022 04/01/21   Bing Neighbors, FNP  ?cetirizine (ZYRTEC) 10 MG tablet Take 1 tablet (10 mg total) by mouth at bedtime. For nasal congestion/postnasal drainage ?Patient not taking: Reported on 12/28/2019 08/15/18 04/04/20  Cain Saupe, MD  ? ? ?Family History ?Family  History  ?Problem Relation Age of Onset  ? Diabetes Father   ? Breast cancer Neg Hx   ? ? ?Social History ?Social History  ? ?Tobacco Use  ? Smoking status: Never  ? Smokeless tobacco: Never  ?Vaping Use  ? Vaping Use: Never used  ?Substance Use Topics  ? Alcohol use: Yes  ?  Comment: occasional   ? Drug use: No  ? ? ? ?Allergies   ?Patient has no known allergies. ? ? ?Review of Systems ?Review of Systems  ?Constitutional: Negative.   ?HENT: Negative.    ?Respiratory:  Positive for cough and wheezing. Negative for apnea, choking, chest tightness, shortness of breath and stridor.   ?Cardiovascular: Negative.   ?Gastrointestinal: Negative.   ?Skin: Negative.   ? ? ?Physical Exam ?Triage  Vital Signs ?ED Triage Vitals  ?Enc Vitals Group  ?   BP 03/08/22 0931 102/64  ?   Pulse Rate 03/08/22 0931 77  ?   Resp 03/08/22 0931 20  ?   Temp 03/08/22 0931 98.4 ?F (36.9 ?C)  ?   Temp Source 03/08/22 0931 Oral  ?   SpO2 03/08/22 0931 97 %  ?   Weight 03/08/22 0928 144 lb (65.3 kg)  ?   Height 03/08/22 0928 5\' 3"  (1.6 m)  ?   Head Circumference --   ?   Peak Flow --   ?   Pain Score 03/08/22 0928 0  ?   Pain Loc --   ?   Pain Edu? --   ?   Excl. in [place]? --   ? ?No data found. ? ?Updated Vital Signs ?BP 102/64 (BP Location: Right Arm)   Pulse 77   Temp 98.4 ?F (36.9 ?C) (Oral)   Resp 20   Ht 5\' 3"  (1.6 m)   Wt 144 lb (65.3 kg)   LMP  (LMP Unknown)   SpO2 97%   BMI 25.51 kg/m?  ? ?Visual Acuity ?Right Eye Distance:   ?Left Eye Distance:   ?Bilateral Distance:   ? ?Right Eye Near:   ?Left Eye Near:    ?Bilateral Near:    ? ?Physical Exam ?Constitutional:   ?   Appearance: Normal appearance.  ?HENT:  ?   Head: Normocephalic.  ?Eyes:  ?   Extraocular Movements: Extraocular movements intact.  ?Cardiovascular:  ?   Rate and Rhythm: Normal rate and regular rhythm.  ?   Pulses: Normal pulses.  ?   Heart sounds: Normal heart sounds.  ?Pulmonary:  ?   Breath sounds: Wheezing present.  ?   Comments: Coughing persistently ?Skin: ?   General: Skin is warm and dry.  ?Neurological:  ?   Mental Status: She is alert and oriented to person, place, and time. Mental status is at baseline.  ?Psychiatric:     ?   Mood and Affect: Mood normal.     ?   Behavior: Behavior normal.  ? ? ? ?UC Treatments / Results  ?Labs ?(all labs ordered are listed, but only abnormal results are displayed) ?Labs Reviewed - No data to display ? ?EKG ? ? ?Radiology ?No results found. ? ?Procedures ?Procedures (including critical care time) ? ?Medications Ordered in UC ?Medications - No data to display ? ?Initial Impression / Assessment and Plan / UC Course  ?I have reviewed the triage vital signs and the nursing notes. ? ?Pertinent labs & imaging  results that were available during my care of the patient were reviewed by me and considered  in my medical decision making (see chart for details). ? ?Acute cough ?Wheezing ? ?Etiology of symptoms is most likely irritation to the upper airways caused by current viral illness, albuterol nebulizer treatment given in office today, symptoms have improved and patient endorses that she feels better after use, heard initially, lungs now clear, prescribed prednisone 40 mg burst, albuterol inhaler, Tessalon and Promethazine DM for management of symptoms, recommended use of humidifier or steam showers to help moisten airways, may follow-up with urgent care as needed for worsening signs of breathing ?Final Clinical Impressions(s) / UC Diagnoses  ? ?Final diagnoses:  ?None  ? ?Discharge Instructions   ?None ?  ? ?ED Prescriptions   ?None ?  ? ?PDMP not reviewed this encounter. ?  ?Valinda HoarWhite, [place] Cloe R, NP ?03/08/22 1034 ? ?

## 2022-03-08 NOTE — ED Triage Notes (Signed)
Patient is here with family member (interrupting) for "Cough, sob". Started this morning. Progressively worse and worse. Last week 'a week ago, had a throat infection, given Amox, still taking". No fever.  ?

## 2022-03-08 NOTE — Discharge Instructions (Signed)
Symptoms today are being caused by your current illness making your upper airways become irritated and inflamed ? ?Starting today take prednisone every morning with food for the next 5 days, this medicine is to reduce inflammation ? ?You may use albuterol inhaler taking 2 puffs every 4 hours as needed when you are unable to catch her breath, this is the same medicine that you have been given in office ? ?You may use Tessalon pill every 8 hours as needed to help calm your coughing ? ?You may use cough syrup every 6 hours for additional comfort, be mindful this medication may make you drowsy ? ?You may follow-up with urgent care as needed for worsening signs of breathing ? ?At nighttime symptoms may worsen if you have a humidifier you may use it, if you do not have a humidifier you may steam up your bathroom and sit for 5 to 10 minutes to help moisten your airways prior to sleeping ? ? ?

## 2022-03-29 ENCOUNTER — Ambulatory Visit
Admission: RE | Admit: 2022-03-29 | Discharge: 2022-03-29 | Disposition: A | Discharge status: Home / Self Care | Payer: Self-pay | Source: Ambulatory Visit | Attending: Physician Assistant | Admitting: Physician Assistant

## 2022-03-29 VITALS — BP 132/75 | HR 113 | Temp 97.9°F | Resp 18

## 2022-03-29 DIAGNOSIS — J45901 Unspecified asthma with (acute) exacerbation: Secondary | ICD-10-CM

## 2022-03-29 MED ORDER — ALBUTEROL SULFATE HFA 108 (90 BASE) MCG/ACT IN AERS: 2.0000 | INHALATION_SPRAY | Freq: Four times a day (QID) | RESPIRATORY_TRACT | 2 refills | Status: DC | PRN

## 2022-03-29 MED ORDER — PREDNISONE 50 MG PO TABS: ORAL_TABLET | ORAL | 0 refills | Status: DC

## 2022-03-29 NOTE — ED Triage Notes (Signed)
Patient presents to Urgent Care with complaints of strep throat recently with persistent cough. Patient reports she finished antibiotics and contuines to cough.  ? ?

## 2022-03-31 ENCOUNTER — Ambulatory Visit: Payer: Self-pay | Attending: Family Medicine | Admitting: Family Medicine

## 2022-03-31 ENCOUNTER — Encounter: Payer: Self-pay | Admitting: Family Medicine

## 2022-03-31 VITALS — BP 103/76 | HR 88 | Ht 63.0 in | Wt 127.8 lb

## 2022-03-31 DIAGNOSIS — R051 Acute cough: Secondary | ICD-10-CM

## 2022-03-31 DIAGNOSIS — Z1159 Encounter for screening for other viral diseases: Secondary | ICD-10-CM

## 2022-03-31 DIAGNOSIS — Z0001 Encounter for general adult medical examination with abnormal findings: Secondary | ICD-10-CM

## 2022-03-31 DIAGNOSIS — Z131 Encounter for screening for diabetes mellitus: Secondary | ICD-10-CM

## 2022-03-31 DIAGNOSIS — J302 Other seasonal allergic rhinitis: Secondary | ICD-10-CM

## 2022-03-31 DIAGNOSIS — Z0000 Encounter for general adult medical examination without abnormal findings: Secondary | ICD-10-CM

## 2022-03-31 LAB — POCT GLYCOSYLATED HEMOGLOBIN (HGB A1C): Hemoglobin A1C: 5.4 % (ref 4.0–5.6)

## 2022-03-31 MED ORDER — BENZONATATE 100 MG PO CAPS: 100.0000 mg | ORAL_CAPSULE | Freq: Three times a day (TID) | ORAL | 0 refills | Status: DC

## 2022-03-31 MED ORDER — FLUTICASONE PROPIONATE 50 MCG/ACT NA SUSP: 2.0000 | Freq: Every day | NASAL | 1 refills | Status: AC

## 2022-03-31 MED ORDER — FEXOFENADINE HCL 180 MG PO TABS: 180.0000 mg | ORAL_TABLET | Freq: Every day | ORAL | 3 refills | Status: DC

## 2022-03-31 NOTE — Progress Notes (Signed)
? ?Subjective:  ?Patient ID: Alexandra Hardy, female    DOB: July 25, 1991  Age: 31 y.o. MRN: [id number] ? ?CC: Cough ? ? ?HPI ?Alexandra Hardy is a 31 y.o. year old female who was scheduled for physical today. ?She had her Pap smear performed by [facility] in 01/2022, NILM, HPV - ve ? ?Interval History: ? ?She complains of a cough for 2 weeks which is now becoming productive. She sometimes has sneezing and has a tickle in her throat.  Symptoms occur all day long but she has no wheeze or dyspnea. ?She complains that she has similar symptoms every year. ?2 days ago she had an urgent care visit with a diagnosis of moderate asthma (of note patient denies a previous history of asthma).  She was placed on prednisone, albuterol MDI. ?3 weeks ago she had an urgent care visit for cough for which she received albuterol inhaler, Tessalon Perles, promethazine and prednisone. ?She endorses taking the prednisone that was prescribed. ? ?Past Medical History:  ?Diagnosis Date  ? Gestational diabetes   ? History of HPV infection   ? ? ?Past Surgical History:  ?Procedure Laterality Date  ? UMBILICAL HERNIA REPAIR N/A 05/27/2020  ? Procedure: HERNIA REPAIR UMBILICAL ADULT, Open with mesh;  Surgeon: Henrene Dodge, MD;  Location: [facility] ORS;  Service: General;  Laterality: N/A;  ? ? ?Family History  ?Problem Relation Age of Onset  ? Diabetes Father   ? Breast cancer Neg Hx   ? ? ?Social History  ? ?Socioeconomic History  ? Marital status: Single  ?  Spouse name: Not on file  ? Number of children: Not on file  ? Years of education: Not on file  ? Highest education level: Not on file  ?Occupational History  ? Not on file  ?Tobacco Use  ? Smoking status: Never  ? Smokeless tobacco: Never  ?Vaping Use  ? Vaping Use: Never used  ?Substance and Sexual Activity  ? Alcohol use: Yes  ?  Comment: occasional   ? Drug use: No  ? Sexual activity: Yes  ?  Birth control/protection: Condom  ?Other Topics Concern   ? Not on file  ?Social History Narrative  ? Not on file  ? ?Social Determinants of Health  ? ?Financial Resource Strain: Not on file  ?Food Insecurity: No Food Insecurity  ? Worried About Programme researcher, broadcasting/film/video in the Last Year: Never true  ? Ran Out of Food in the Last Year: Never true  ?Transportation Needs: No Transportation Needs  ? Lack of Transportation (Medical): No  ? Lack of Transportation (Non-Medical): No  ?Physical Activity: Not on file  ?Stress: Not on file  ?Social Connections: Not on file  ? ? ?No Known Allergies ? ?Outpatient Medications Prior to Visit  ?Medication Sig Dispense Refill  ? predniSONE (DELTASONE) 50 MG tablet One tablet a day 6 tablet 0  ? promethazine-dextromethorphan (PROMETHAZINE-DM) 6.25-15 MG/5ML syrup Take 5 mLs by mouth 4 (four) times daily as needed for cough. 140 mL 0  ? albuterol (VENTOLIN HFA) 108 (90 Base) MCG/ACT inhaler Inhale 2 puffs into the lungs every 6 (six) hours as needed for wheezing or shortness of breath. (Patient not taking: Reported on 03/31/2022) 8 g 2  ? acetaminophen (TYLENOL) 325 MG tablet Take 650 mg by mouth every 6 (six) hours as needed for moderate pain or headache.  (Patient not taking: Reported on 01/20/2022)    ? benzonatate (TESSALON) 100 MG capsule Take 1 capsule (100 mg  total) by mouth every 8 (eight) hours. (Patient not taking: Reported on 03/31/2022) 21 capsule 0  ? BL EVENING PRIMROSE OIL PO Take by mouth. (Patient not taking: Reported on 03/31/2022)    ? Cyanocobalamin (B-12 PO) Take by mouth. (Patient not taking: Reported on 03/31/2022)    ? fexofenadine (ALLEGRA) 180 MG tablet Take 180 mg by mouth daily.  (Patient not taking: Reported on 01/20/2022)    ? ?No facility-administered medications prior to visit.  ? ? ? ?ROS ?Review of Systems  ?Constitutional:  Negative for activity change, appetite change and fatigue.  ?HENT:  Negative for congestion, sinus pressure and sore throat.   ?Eyes:  Negative for visual disturbance.  ?Respiratory:  Positive for  cough. Negative for chest tightness, shortness of breath and wheezing.   ?Cardiovascular:  Negative for chest pain and palpitations.  ?Gastrointestinal:  Negative for abdominal distention, abdominal pain and constipation.  ?Endocrine: Negative for polydipsia.  ?Genitourinary:  Negative for dysuria and frequency.  ?Musculoskeletal:  Negative for arthralgias and back pain.  ?Skin:  Negative for rash.  ?Neurological:  Negative for tremors, light-headedness and numbness.  ?Hematological:  Does not bruise/bleed easily.  ?Psychiatric/Behavioral:  Negative for agitation and behavioral problems.   ? ?Objective:  ?BP 103/76   Pulse 88   Ht 5\' 3"  (1.6 m)   Wt 127 lb 12.8 oz (58 kg)   LMP  (LMP Unknown)   SpO2 98%   BMI 22.64 kg/m?  ? ? ?  03/31/2022  ?  2:49 PM 03/29/2022  ?  6:18 PM 03/08/2022  ?  9:31 AM  ?BP/Weight  ?Systolic BP 103 132 102  ?Diastolic BP 76 75 64  ?Wt. (Lbs) 127.8    ?BMI 22.64 kg/m2    ? ? ? ? ?Physical Exam ?Constitutional:   ?   General: She is not in acute distress. ?   Appearance: She is well-developed. She is not diaphoretic.  ?HENT:  ?   Head: Normocephalic.  ?   Right Ear: External ear normal.  ?   Left Ear: External ear normal.  ?   Nose: Nose normal.  ?   Mouth/Throat:  ?   Mouth: Mucous membranes are moist.  ?Eyes:  ?   Extraocular Movements: Extraocular movements intact.  ?   Conjunctiva/sclera: Conjunctivae normal.  ?   Pupils: Pupils are equal, round, and reactive to light.  ?Neck:  ?   Vascular: No JVD.  ?Cardiovascular:  ?   Rate and Rhythm: Normal rate and regular rhythm.  ?   Pulses: Normal pulses.  ?   Heart sounds: Normal heart sounds. No murmur heard. ?  No gallop.  ?Pulmonary:  ?   Effort: Pulmonary effort is normal. No respiratory distress.  ?   Breath sounds: Normal breath sounds. No wheezing or rales.  ?Chest:  ?   Chest wall: No tenderness.  ?Abdominal:  ?   General: Bowel sounds are normal. There is no distension.  ?   Palpations: Abdomen is soft. There is no mass.  ?    Tenderness: There is no abdominal tenderness.  ?Musculoskeletal:     ?   General: No tenderness. Normal range of motion.  ?   Cervical back: Normal range of motion.  ?   Right lower leg: No edema.  ?   Left lower leg: No edema.  ?Skin: ?   General: Skin is warm and dry.  ?Neurological:  ?   Mental Status: She is alert and oriented to person, place,  and time.  ?   Deep Tendon Reflexes: Reflexes are normal and symmetric.  ?Psychiatric:     ?   Mood and Affect: Mood normal.  ? ? ? ?  Latest Ref Rng & Units 06/01/2020  ?  2:30 PM 04/20/2017  ?  6:46 PM 04/20/2017  ? 12:33 AM  ?CMP  ?Glucose 70 - 99 mg/dL 595   87   638    ?BUN 6 - 20 mg/dL 14   9   14     ?Creatinine 0.44 - 1.00 mg/dL 7.56   4.33   2.95    ?Sodium 135 - 145 mmol/L 137   137   136    ?Potassium 3.5 - 5.1 mmol/L 4.2   4.1   4.0    ?Chloride 98 - 111 mmol/L 104   105   106    ?CO2 22 - 32 mmol/L 23   25   24     ?Calcium 8.9 - 10.3 mg/dL 9.1   8.8   9.0    ?Total Protein 6.5 - 8.1 g/dL 7.5   8.2     ?Total Bilirubin 0.3 - 1.2 mg/dL 0.5   0.6     ?Alkaline Phos 38 - 126 U/L 62   90     ?AST 15 - 41 U/L 18   32     ?ALT 0 - 44 U/L 20   22     ? ? ?Lipid Panel  ?No results found for: CHOL, TRIG, HDL, CHOLHDL, VLDL, LDLCALC, LDLDIRECT ? ?CBC ?   ?Component Value Date/Time  ? WBC 5.8 06/01/2020 1430  ? RBC 4.37 06/01/2020 1430  ? HGB 13.9 06/01/2020 1430  ? HGB 13.7 08/15/2018 1537  ? HCT 41.2 06/01/2020 1430  ? HCT 40.4 08/15/2018 1537  ? PLT 224 06/01/2020 1430  ? PLT 243 08/15/2018 1537  ? MCV 94.3 06/01/2020 1430  ? MCV 95 08/15/2018 1537  ? MCH 31.8 06/01/2020 1430  ? MCHC 33.7 06/01/2020 1430  ? RDW 12.0 06/01/2020 1430  ? RDW 13.5 08/15/2018 1537  ? LYMPHSABS 1.4 08/15/2018 1537  ? MONOABS 0.2 09/02/2017 2024  ? EOSABS 0.3 08/15/2018 1537  ? BASOSABS 0.0 08/15/2018 1537  ? ? ?Lab Results  ?Component Value Date  ? HGBA1C 5.4 03/31/2022  ? ? ?Assessment & Plan:  ?1. Annual physical exam ?Counseled on 150 minutes of exercise per week, healthy eating (including  decreased daily intake of saturated fats, cholesterol, added sugars, sodium), STI prevention, routine healthcare maintenance. ? ?- LP+Non-HDL Cholesterol; Future ?- CMP14+EGFR; Future ?- CBC with Differential/Plat

## 2022-04-02 ENCOUNTER — Ambulatory Visit: Payer: Self-pay | Attending: Family Medicine

## 2022-04-02 DIAGNOSIS — Z0000 Encounter for general adult medical examination without abnormal findings: Secondary | ICD-10-CM

## 2022-04-03 LAB — CBC WITH DIFFERENTIAL/PLATELET
Basophils Absolute: 0 10*3/uL (ref 0.0–0.2)
Basos: 0 %
EOS (ABSOLUTE): 0 10*3/uL (ref 0.0–0.4)
Eos: 0 %
Hematocrit: 38.4 % (ref 34.0–46.6)
Hemoglobin: 12.8 g/dL (ref 11.1–15.9)
Immature Grans (Abs): 0 10*3/uL (ref 0.0–0.1)
Immature Granulocytes: 0 %
Lymphocytes Absolute: 1.4 10*3/uL (ref 0.7–3.1)
Lymphs: 20 %
MCH: 31.9 pg (ref 26.6–33.0)
MCHC: 33.3 g/dL (ref 31.5–35.7)
MCV: 96 fL (ref 79–97)
Monocytes Absolute: 0.3 10*3/uL (ref 0.1–0.9)
Monocytes: 4 %
Neutrophils Absolute: 5.3 10*3/uL (ref 1.4–7.0)
Neutrophils: 76 %
Platelets: 203 10*3/uL (ref 150–450)
RBC: 4.01 x10E6/uL (ref 3.77–5.28)
RDW: 12.7 % (ref 11.7–15.4)
WBC: 7 10*3/uL (ref 3.4–10.8)

## 2022-04-03 LAB — LP+NON-HDL CHOLESTEROL
Cholesterol, Total: 185 mg/dL (ref 100–199)
HDL: 53 mg/dL (ref >39)
LDL Chol Calc (NIH): 115 mg/dL — ABNORMAL HIGH (ref 0–99)
Total Non-HDL-Chol (LDL+VLDL): 132 mg/dL — ABNORMAL HIGH (ref 0–129)
Triglycerides: 95 mg/dL (ref 0–149)
VLDL Cholesterol Cal: 17 mg/dL (ref 5–40)

## 2022-04-03 LAB — CMP14+EGFR
ALT: 14 IU/L (ref 0–32)
AST: 16 IU/L (ref 0–40)
Albumin/Globulin Ratio: 1.6 (ref 1.2–2.2)
Albumin: 4.3 g/dL (ref 3.8–4.8)
Alkaline Phosphatase: 92 IU/L (ref 44–121)
BUN/Creatinine Ratio: 21 (ref 9–23)
BUN: 12 mg/dL (ref 6–20)
Bilirubin Total: <0.2 mg/dL (ref 0.0–1.2)
CO2: 22 mmol/L (ref 20–29)
Calcium: 9 mg/dL (ref 8.7–10.2)
Chloride: 103 mmol/L (ref 96–106)
Creatinine, Ser: 0.57 mg/dL (ref 0.57–1.00)
Globulin, Total: 2.7 g/dL (ref 1.5–4.5)
Glucose: 95 mg/dL (ref 70–99)
Potassium: 4.5 mmol/L (ref 3.5–5.2)
Sodium: 137 mmol/L (ref 134–144)
Total Protein: 7 g/dL (ref 6.0–8.5)
eGFR: 123 mL/min/{1.73_m2} (ref >59)

## 2022-04-03 LAB — TSH: TSH: 0.469 u[IU]/mL (ref 0.450–4.500)

## 2022-04-03 LAB — T4, FREE: Free T4: 1.2 ng/dL (ref 0.82–1.77)

## 2022-04-03 LAB — VITAMIN D 25 HYDROXY (VIT D DEFICIENCY, FRACTURES): Vit D, 25-Hydroxy: 9.1 ng/mL — ABNORMAL LOW (ref 30.0–100.0)

## 2022-04-03 NOTE — ED Provider Notes (Signed)
?[facility] ? ? ? ?CSN: [id number] ?Arrival date & time: 03/29/22  1755 ? ? ?  ? ?History   ?Chief Complaint ?Chief Complaint  ?Patient presents with  ? Cough  ?  Entered by patient  ? ? ?HPI ?Alexandra Hardy is a 31 y.o. female.  ? ?Pt recently treated for strep.  Pt complains of shortness of breath and persistent cough.  Pt is out of albuterol   ? ?The history is provided by the patient and a relative. No language interpreter was used.  ?Cough ?Cough characteristics:  Non-productive ?Sputum characteristics:  Nondescript ?Severity:  Moderate ?Onset quality:  Gradual ?Duration:  2 weeks ?Timing:  Constant ?Progression:  Worsening ?Chronicity:  New ? ?Past Medical History:  ?Diagnosis Date  ? Gestational diabetes   ? History of HPV infection   ? ? ?Patient Active Problem List  ? Diagnosis Date Noted  ? Umbilical hernia without obstruction and without gangrene   ? Normal labor 04/12/2018  ? SVD (spontaneous vaginal delivery) 04/12/2018  ? History of gestational diabetes in prior pregnancy, currently pregnant 11/02/2017  ? Supervision of high risk pregnancy, antepartum, first trimester 10/05/2017  ? Pap smear of cervix shows high risk HPV present 05/14/2015  ? Language barrier affecting health care 03/12/2015  ? ? ?Past Surgical History:  ?Procedure Laterality Date  ? UMBILICAL HERNIA REPAIR N/A 05/27/2020  ? Procedure: HERNIA REPAIR UMBILICAL ADULT, Open with mesh;  Surgeon: Olean Ree, MD;  Location: [facility] ORS;  Service: General;  Laterality: N/A;  ? ? ?OB History   ? ? Gravida  ?2  ? Para  ?2  ? Term  ?2  ? Preterm  ?   ? AB  ?   ? Living  ?2  ?  ? ? SAB  ?   ? IAB  ?   ? Ectopic  ?   ? Multiple  ?0  ? Live Births  ?2  ?   ?  ?  ? ? ? ?Home Medications   ? ?Prior to Admission medications   ?Medication Sig Start Date End Date Taking? Authorizing Provider  ?albuterol (VENTOLIN HFA) 108 (90 Base) MCG/ACT inhaler Inhale 2 puffs into the lungs every 6 (six) hours as needed for wheezing or  shortness of breath. ?Patient not taking: Reported on 03/31/2022 03/29/22  Yes Caryl Ada K, PA-C  ?predniSONE (DELTASONE) 50 MG tablet One tablet a day 03/29/22  Yes Caryl Ada K, PA-C  ?benzonatate (TESSALON) 100 MG capsule Take 1 capsule (100 mg total) by mouth every 8 (eight) hours. 03/31/22   Charlott Rakes, MD  ?fexofenadine (ALLEGRA) 180 MG tablet Take 1 tablet (180 mg total) by mouth daily. 03/31/22   Charlott Rakes, MD  ?fluticasone (FLONASE) 50 MCG/ACT nasal spray Place 2 sprays into both nostrils daily. 03/31/22   Charlott Rakes, MD  ?promethazine-dextromethorphan (PROMETHAZINE-DM) 6.25-15 MG/5ML syrup Take 5 mLs by mouth 4 (four) times daily as needed for cough. 03/08/22   Hans Eden, NP  ?cetirizine (ZYRTEC) 10 MG tablet Take 1 tablet (10 mg total) by mouth at bedtime. For nasal congestion/postnasal drainage ?Patient not taking: Reported on 12/28/2019 08/15/18 04/04/20  Antony Blackbird, MD  ? ? ?Family History ?Family History  ?Problem Relation Age of Onset  ? Diabetes Father   ? Breast cancer Neg Hx   ? ? ?Social History ?Social History  ? ?Tobacco Use  ? Smoking status: Never  ? Smokeless tobacco: Never  ?Vaping Use  ? Vaping Use: Never used  ?  Substance Use Topics  ? Alcohol use: Yes  ?  Comment: occasional   ? Drug use: No  ? ? ? ?Allergies   ?Patient has no known allergies. ? ? ?Review of Systems ?Review of Systems  ?Respiratory:  Positive for cough.   ?All other systems reviewed and are negative. ? ? ?Physical Exam ?Triage Vital Signs ?ED Triage Vitals  ?Enc Vitals Group  ?   BP 03/29/22 1818 132/75  ?   Pulse Rate 03/29/22 1818 (!) 113  ?   Resp 03/29/22 1818 18  ?   Temp 03/29/22 1818 97.9 ?F (36.6 ?C)  ?   Temp Source 03/29/22 1818 Oral  ?   SpO2 03/29/22 1818 99 %  ?   Weight --   ?   Height --   ?   Head Circumference --   ?   Peak Flow --   ?   Pain Score 03/29/22 1817 6  ?   Pain Loc --   ?   Pain Edu? --   ?   Excl. in [place]? --   ? ?No data found. ? ?Updated Vital Signs ?BP 132/75 (BP  Location: Right Arm)   Pulse (!) 113   Temp 97.9 ?F (36.6 ?C) (Oral)   Resp 18   LMP  (LMP Unknown)   SpO2 99%  ? ?Visual Acuity ?Right Eye Distance:   ?Left Eye Distance:   ?Bilateral Distance:   ? ?Right Eye Near:   ?Left Eye Near:    ?Bilateral Near:    ? ?Physical Exam ?Vitals and nursing note reviewed.  ?Constitutional:   ?   Appearance: She is well-developed.  ?HENT:  ?   Head: Normocephalic.  ?Cardiovascular:  ?   Rate and Rhythm: Normal rate.  ?Pulmonary:  ?   Effort: Pulmonary effort is normal.  ?   Breath sounds: Wheezing and rhonchi present.  ?Abdominal:  ?   General: There is no distension.  ?Musculoskeletal:     ?   General: Normal range of motion.  ?   Cervical back: Normal range of motion.  ?Neurological:  ?   Mental Status: She is alert and oriented to person, place, and time.  ?Psychiatric:     ?   Mood and Affect: Mood normal.  ? ? ? ?UC Treatments / Results  ?Labs ?(all labs ordered are listed, but only abnormal results are displayed) ?Labs Reviewed - No data to display ? ?EKG ? ? ?Radiology ?No results found. ? ?Procedures ?Procedures (including critical care time) ? ?Medications Ordered in UC ?Medications - No data to display ? ?Initial Impression / Assessment and Plan / UC Course  ?I have reviewed the triage vital signs and the nursing notes. ? ?Pertinent labs & imaging results that were available during my care of the patient were reviewed by me and considered in my medical decision making (see chart for details). ? ?  ? ?MDM:  Pt has slight wheezing.  02 sats 99%.  Pt given rx for prednisone and refill of albuterol.Pt advised to recheck with primary care in 2-3 days  ?Final Clinical Impressions(s) / UC Diagnoses  ? ?Final diagnoses:  ?Moderate asthma with acute exacerbation, unspecified whether persistent  ? ?Discharge Instructions   ?None ?  ? ?ED Prescriptions   ? ? Medication Sig Dispense Auth. Provider  ? albuterol (VENTOLIN HFA) 108 (90 Base) MCG/ACT inhaler Inhale 2 puffs into the  lungs every 6 (six) hours as needed for wheezing or shortness of breath.  Patient not taking:  Reported on 03/31/2022 8 g Caryl Ada K, PA-C  ? predniSONE (DELTASONE) 50 MG tablet One tablet a day 6 tablet Fransico Meadow, [place]  ? ?  ? ?PDMP not reviewed this encounter. ?An After Visit Summary was printed and given to the patient. ? ?  ?Fransico Meadow, [place] ?04/03/22 1316 ? ?

## 2022-04-05 ENCOUNTER — Other Ambulatory Visit: Payer: Self-pay | Admitting: Family Medicine

## 2022-04-05 MED ORDER — ERGOCALCIFEROL 1.25 MG (50000 UT) PO CAPS: 50000.0000 [IU] | ORAL_CAPSULE | ORAL | 1 refills | Status: AC

## 2022-04-12 ENCOUNTER — Ambulatory Visit: Payer: Self-pay | Admitting: Radiology

## 2022-04-12 ENCOUNTER — Ambulatory Visit: Payer: Self-pay | Admitting: Obstetrics and Gynecology

## 2022-04-12 NOTE — Progress Notes (Deleted)
GYNECOLOGY  VISIT   HPI: 31 y.o.   Single  {Race/ethnicity:17218}  female   [id number] with No LMP recorded.   here for     GYNECOLOGIC HISTORY: No LMP recorded. Contraception:  none Menopausal hormone therapy: n/a Last mammogram:   02-04-22 diag.Bil.w/Rt.US--Neg/BiRads1 Last pap smear:  01-28-22 Neg:Neg HR HPV, 03-12-15 Neg:Neg HR HPV        OB History     Gravida  2   Para  2   Term  2   Preterm      AB      Living  2      SAB      IAB      Ectopic      Multiple  0   Live Births  2              Patient Active Problem List   Diagnosis Date Noted   Umbilical hernia without obstruction and without gangrene    Normal labor 04/12/2018   SVD (spontaneous vaginal delivery) 04/12/2018   History of gestational diabetes in prior pregnancy, currently pregnant 11/02/2017   Supervision of high risk pregnancy, antepartum, first trimester 10/05/2017   Pap smear of cervix shows high risk HPV present 05/14/2015   Language barrier affecting health care 03/12/2015    Past Medical History:  Diagnosis Date   Gestational diabetes    History of HPV infection     Past Surgical History:  Procedure Laterality Date   UMBILICAL HERNIA REPAIR N/A 05/27/2020   Procedure: HERNIA REPAIR UMBILICAL ADULT, Open with mesh;  Surgeon: Henrene Dodge, MD;  Location: [facility] ORS;  Service: General;  Laterality: N/A;    Current Outpatient Medications  Medication Sig Dispense Refill   ergocalciferol (DRISDOL) 1.25 MG (50000 UT) capsule Take 1 capsule (50,000 Units total) by mouth once a week. 12 capsule 1   albuterol (VENTOLIN HFA) 108 (90 Base) MCG/ACT inhaler Inhale 2 puffs into the lungs every 6 (six) hours as needed for wheezing or shortness of breath. (Patient not taking: Reported on 03/31/2022) 8 g 2   benzonatate (TESSALON) 100 MG capsule Take 1 capsule (100 mg total) by mouth every 8 (eight) hours. 21 capsule 0   fexofenadine (ALLEGRA) 180 MG tablet Take 1 tablet (180 mg total) by mouth  daily. 30 tablet 3   fluticasone (FLONASE) 50 MCG/ACT nasal spray Place 2 sprays into both nostrils daily. 16 g 1   predniSONE (DELTASONE) 50 MG tablet One tablet a day 6 tablet 0   promethazine-dextromethorphan (PROMETHAZINE-DM) 6.25-15 MG/5ML syrup Take 5 mLs by mouth 4 (four) times daily as needed for cough. 140 mL 0   No current facility-administered medications for this visit.     ALLERGIES: Patient has no known allergies.  Family History  Problem Relation Age of Onset   Diabetes Father    Breast cancer Neg Hx     Social History   Socioeconomic History   Marital status: Single    Spouse name: Not on file   Number of children: Not on file   Years of education: Not on file   Highest education level: Not on file  Occupational History   Not on file  Tobacco Use   Smoking status: Never   Smokeless tobacco: Never  Vaping Use   Vaping Use: Never used  Substance and Sexual Activity   Alcohol use: Yes    Comment: occasional    Drug use: No   Sexual activity: Yes    Birth control/protection:  Condom  Other Topics Concern   Not on file  Social History Narrative   Not on file   Social Determinants of Health   Financial Resource Strain: Not on file  Food Insecurity: No Food Insecurity   Worried About Running Out of Food in the Last Year: Never true   Ran Out of Food in the Last Year: Never true  Transportation Needs: No Transportation Needs   Lack of Transportation (Medical): No   Lack of Transportation (Non-Medical): No  Physical Activity: Not on file  Stress: Not on file  Social Connections: Not on file  Intimate Partner Violence: Not on file    Review of Systems  PHYSICAL EXAMINATION:    There were no vitals taken for this visit.    General appearance: alert, cooperative and appears stated age Head: Normocephalic, without obvious abnormality, atraumatic Neck: no adenopathy, supple, symmetrical, trachea midline and thyroid normal to inspection and  palpation Lungs: clear to auscultation bilaterally Breasts: normal appearance, no masses or tenderness, No nipple retraction or dimpling, No nipple discharge or bleeding, No axillary or supraclavicular adenopathy Heart: regular rate and rhythm Abdomen: soft, non-tender, no masses,  no organomegaly Extremities: extremities normal, atraumatic, no cyanosis or edema Skin: Skin color, texture, turgor normal. No rashes or lesions Lymph nodes: Cervical, supraclavicular, and axillary nodes normal. No abnormal inguinal nodes palpated Neurologic: Grossly normal  Pelvic: External genitalia:  no lesions              Urethra:  normal appearing urethra with no masses, tenderness or lesions              Bartholins and Skenes: normal                 Vagina: normal appearing vagina with normal color and discharge, no lesions              Cervix: no lesions                Bimanual Exam:  Uterus:  normal size, contour, position, consistency, mobility, non-tender              Adnexa: no mass, fullness, tenderness              Rectal exam: {yes no:[id number]}.  Confirms.              Anus:  normal sphincter tone, no lesions  Chaperone was present for exam:  ***  ASSESSMENT     PLAN     An After Visit Summary was printed and given to the patient.  ______ minutes face to face time of which over 50% was spent in counseling.

## 2022-04-16 ENCOUNTER — Ambulatory Visit: Payer: Self-pay | Admitting: Obstetrics & Gynecology

## 2022-04-16 ENCOUNTER — Encounter: Payer: Self-pay | Admitting: Obstetrics & Gynecology

## 2022-04-16 VITALS — BP 114/78

## 2022-04-16 DIAGNOSIS — N898 Other specified noninflammatory disorders of vagina: Secondary | ICD-10-CM

## 2022-04-16 DIAGNOSIS — Z3009 Encounter for other general counseling and advice on contraception: Secondary | ICD-10-CM

## 2022-04-16 DIAGNOSIS — N9411 Superficial (introital) dyspareunia: Secondary | ICD-10-CM

## 2022-04-16 LAB — WET PREP FOR TRICH, YEAST, CLUE

## 2022-04-16 NOTE — Progress Notes (Unsigned)
Alexandra Hardy [facility] 1990-12-15 [id number]        31 y.o.  G2P2002   RP: Vaginal discharge and pain with intercourse  HPI: Patient c/o increased vaginal discharge and pain at entrance and in the vagina during IC.  No postcoital bleeding.  No pelvic pain.  No fever.  Not currently using contraception.  Interested in an IUD. Urine/BMs normal.   OB History  Gravida Para Term Preterm AB Living  2 2 2     2   SAB IAB Ectopic Multiple Live Births          2    # Outcome Date GA Lbr Len/2nd Weight Sex Delivery Anes PTL Lv  2 Term 04/12/18 [redacted]w[redacted]d 04:51 / 00:36 8 lb 7.1 oz (3.83 kg) M Vag-Spont EPI  LIV  1 Term 09/29/15 [redacted]w[redacted]d 06:24 / 03:42 7 lb 10.8 oz (3.481 kg) F Vag-Spont EPI  LIV    Past medical history,surgical history, problem list, medications, allergies, family history and social history were all reviewed and documented in the EPIC chart.   Directed ROS with pertinent positives and negatives documented in the history of present illness/assessment and plan.  Exam:  Vitals:   04/16/22 1504  BP: 114/78   General appearance:  Normal  Abdomen: Normal  Gynecologic exam: Vulva normal.  Speculum:  Cervix/Vagina normal.  Gono-Chlam done.  Mild increase in vaginal discharge.  Wet prep done.  Wet prep:  Negative   Assessment/Plan:  31 y.o. G2P2002   1. Vaginal discharge Mild vaginal discharge with negative wet prep.  R/O Gono-Chlam. - WET PREP FOR TRICH, YEAST, CLUE - C. trachomatis/N. gonorrhoeae RNA  2. Superficial dyspareunia Superficial dyspareunia probably d/t friction.  R/O STI with Gono-Chlam done, pending.  Counseling done on superficial dyspareunia.  Recommend using Coconut oil and improving natural lubrication by increasing the foreplay time and stimulation.   - C. trachomatis/N. gonorrhoeae RNA  3. Encounter for other general counseling or advice on contraception Considering IUD for contraception.  Will decide and schedule with Jami C. when  ready.  Other orders - cetirizine (ZYRTEC) 10 MG tablet; Take 10 mg by mouth daily. - Multiple Vitamin (MULTIVITAMIN PO); Take by mouth.   Counseling and management of superficial dyspareunia, vaginal discharge, STI and contraception x 30 minutes.  Genia Del MD, 3:12 PM 04/16/2022

## 2022-04-17 ENCOUNTER — Encounter: Payer: Self-pay | Admitting: Obstetrics & Gynecology

## 2022-04-17 LAB — C. TRACHOMATIS/N. GONORRHOEAE RNA
C. trachomatis RNA, TMA: NOT DETECTED
N. gonorrhoeae RNA, TMA: NOT DETECTED

## 2022-05-19 ENCOUNTER — Ambulatory Visit: Payer: Self-pay | Admitting: Family Medicine

## 2022-10-20 ENCOUNTER — Ambulatory Visit
Admission: EM | Admit: 2022-10-20 | Discharge: 2022-10-20 | Disposition: A | Discharge status: Home / Self Care | Payer: Self-pay | Attending: Physician Assistant | Admitting: Physician Assistant

## 2022-10-20 DIAGNOSIS — M545 Low back pain, unspecified: Secondary | ICD-10-CM | POA: Insufficient documentation

## 2022-10-20 LAB — URINALYSIS, ROUTINE W REFLEX MICROSCOPIC
Bilirubin Urine: NEGATIVE
Glucose, UA: NEGATIVE mg/dL
Hgb urine dipstick: NEGATIVE
Ketones, ur: NEGATIVE mg/dL
Leukocytes,Ua: NEGATIVE
Nitrite: NEGATIVE
Protein, ur: NEGATIVE mg/dL
Specific Gravity, Urine: 1.02 (ref 1.005–1.030)
pH: 6 (ref 5.0–8.0)

## 2022-10-20 LAB — WET PREP, GENITAL
Clue Cells Wet Prep HPF POC: NONE SEEN
Sperm: NONE SEEN
Trich, Wet Prep: NONE SEEN
WBC, Wet Prep HPF POC: <10 (ref <10)
Yeast Wet Prep HPF POC: NONE SEEN

## 2022-10-20 MED ORDER — NAPROXEN 500 MG PO TABS: 500.0000 mg | ORAL_TABLET | Freq: Two times a day (BID) | ORAL | 0 refills | Status: DC

## 2022-10-20 NOTE — ED Triage Notes (Addendum)
Pt present bilateral pain in her back, symptom started a week ago. Pt states the area is not pain its burning sensation.

## 2022-10-20 NOTE — Discharge Instructions (Signed)
-  Urine test and swab were negative for any signs of infection -Can take the naproxen, 1 tablet twice a day.  Would take that for 5 days to see if you have any improvement. -Can also use Tylenol over-the-counter -Attached information has some simple stretching to help with your pain -Follow with primary care provider if no improvement.

## 2022-10-20 NOTE — ED Provider Notes (Signed)
[facility]    CSN: [id number] Arrival date & time: 10/20/22  0848      History   Chief Complaint Chief Complaint  Patient presents with   Back Pain    HPI Alexandra Hardy is a 31 y.o. female.   Patient is a 31 year old female who presents with bilateral lower back pain that started last Thursday (today is Tuesday).  Patient ports pain is more of a burning sensation on both sides that increases with movement.  States she is having some trouble sleeping due to her pain.  Patient denies any urinary symptoms but reports some white vaginal discharge.  Patient denies any numbness or tingling in her legs.  Patient ports last menstrual period of October 28.  Patient reports similar symptoms in the past with no symptoms went away after a day or so on their own.    Past Medical History:  Diagnosis Date   Gestational diabetes    History of HPV infection     Patient Active Problem List   Diagnosis Date Noted   Umbilical hernia without obstruction and without gangrene    Normal labor 04/12/2018   SVD (spontaneous vaginal delivery) 04/12/2018   History of gestational diabetes in prior pregnancy, currently pregnant 11/02/2017   Supervision of high risk pregnancy, antepartum, first trimester 10/05/2017   Pap smear of cervix shows high risk HPV present 05/14/2015   Language barrier affecting health care 03/12/2015    Past Surgical History:  Procedure Laterality Date   UMBILICAL HERNIA REPAIR N/A 05/27/2020   Procedure: HERNIA REPAIR UMBILICAL ADULT, Open with mesh;  Surgeon: Henrene Dodge, MD;  Location: [facility] ORS;  Service: General;  Laterality: N/A;    OB History     Gravida  2   Para  2   Term  2   Preterm      AB      Living  2      SAB      IAB      Ectopic      Multiple      Live Births  2            Home Medications    Prior to Admission medications   Medication Sig Start Date End Date Taking? Authorizing Provider   naproxen (NAPROSYN) 500 MG tablet Take 1 tablet (500 mg total) by mouth 2 (two) times daily. 10/20/22  Yes Candis Schatz, PA-C  benzonatate (TESSALON) 100 MG capsule Take 1 capsule (100 mg total) by mouth every 8 (eight) hours. 03/31/22   Hoy Register, MD  cetirizine (ZYRTEC) 10 MG tablet Take 10 mg by mouth daily.    [provider]  ergocalciferol (DRISDOL) 1.25 MG (50000 UT) capsule Take 1 capsule (50,000 Units total) by mouth once a week. 04/05/22   Hoy Register, MD  fluticasone (FLONASE) 50 MCG/ACT nasal spray Place 2 sprays into both nostrils daily. 03/31/22   Hoy Register, MD  Multiple Vitamin (MULTIVITAMIN PO) Take by mouth.    [provider]    Family History Family History  Problem Relation Age of Onset   Diabetes Father     Social History Social History   Tobacco Use   Smoking status: Never   Smokeless tobacco: Never  Vaping Use   Vaping Use: Never used  Substance Use Topics   Alcohol use: Not Currently   Drug use: No     Allergies   Patient has no known allergies.   Review of Systems Review  of Systems As noted above.  Other systems reviewed and found to be negative.   Physical Exam Triage Vital Signs ED Triage Vitals  Enc Vitals Group     BP 10/20/22 0856 93/63     Pulse Rate 10/20/22 0856 (!) 58     Resp 10/20/22 0856 16     Temp 10/20/22 0856 98.9 F (37.2 C)     Temp Source 10/20/22 0856 Oral     SpO2 10/20/22 0856 100 %     Weight --      Height --      Head Circumference --      Peak Flow --      Pain Score 10/20/22 0857 8     Pain Loc --      Pain Edu? --      Excl. in [place]? --    No data found.  Updated Vital Signs BP 93/63 (BP Location: Right Arm)   Pulse (!) 58   Temp 98.9 F (37.2 C) (Oral)   Resp 16   LMP 09/10/2022   SpO2 100%    Physical Exam Constitutional:      Comments: Appears uncomfortable, moving around while sitting on stretcher  Cardiovascular:     Rate and Rhythm: Normal rate and  regular rhythm.  Pulmonary:     Effort: Pulmonary effort is normal.  Abdominal:     General: Abdomen is flat. There is no distension.     Tenderness: There is no abdominal tenderness. There is no right CVA tenderness, left CVA tenderness, guarding or rebound.  Musculoskeletal:        General: No tenderness. Normal range of motion.  Neurological:     General: No focal deficit present.     Mental Status: She is alert and oriented to person, place, and time.      UC Treatments / Results  Labs (all labs ordered are listed, but only abnormal results are displayed) Labs Reviewed  WET PREP, GENITAL  URINALYSIS, ROUTINE W REFLEX MICROSCOPIC    EKG   Radiology No results found.  Procedures Procedures (including critical care time)  Medications Ordered in UC Medications - No data to display  Initial Impression / Assessment and Plan / UC Course  I have reviewed the triage vital signs and the nursing notes.  Pertinent labs & imaging results that were available during my care of the patient were reviewed by me and considered in my medical decision making (see chart for details).    Urinalysis and wet prep were negative.  Patient most of the burning sensation bilateral lower back.  No pain with palpation.  Patient reports increased pain with movement.  Will give her prescription for naproxen.  If no improvement, have her follow-up with primary care. Final Clinical Impressions(s) / UC Diagnoses   Final diagnoses:  Acute bilateral low back pain without sciatica     Discharge Instructions      -Urine test and swab were negative for any signs of infection -Can take the naproxen, 1 tablet twice a day.  Would take that for 5 days to see if you have any improvement. -Can also use Tylenol over-the-counter -Attached information has some simple stretching to help with your pain -Follow with primary care provider if no improvement.     ED Prescriptions     Medication Sig  Dispense Auth. Provider   naproxen (NAPROSYN) 500 MG tablet Take 1 tablet (500 mg total) by mouth 2 (two) times daily. 30 tablet Sherrlyn Hock  D, PA-C      PDMP not reviewed this encounter.   Candis Schatz, PA-C 10/20/22 [phone]

## 2022-12-14 ENCOUNTER — Ambulatory Visit
Admission: EM | Admit: 2022-12-14 | Discharge: 2022-12-14 | Disposition: A | Discharge status: Home / Self Care | Payer: Self-pay | Attending: Emergency Medicine | Admitting: Emergency Medicine

## 2022-12-14 DIAGNOSIS — J020 Streptococcal pharyngitis: Secondary | ICD-10-CM | POA: Insufficient documentation

## 2022-12-14 LAB — GROUP A STREP BY PCR: Group A Strep by PCR: DETECTED — AB

## 2022-12-14 MED ORDER — IBUPROFEN 600 MG PO TABS
600.0000 mg | ORAL_TABLET | Freq: Once | ORAL | Status: AC
  Administered 2022-12-14: 600 mg via ORAL

## 2022-12-14 MED ORDER — AMOXICILLIN-POT CLAVULANATE 875-125 MG PO TABS: 1.0000 | ORAL_TABLET | Freq: Two times a day (BID) | ORAL | 0 refills | Status: AC

## 2022-12-14 MED ORDER — IBUPROFEN 600 MG PO TABS: 600.0000 mg | ORAL_TABLET | Freq: Four times a day (QID) | ORAL | 0 refills | Status: AC | PRN

## 2022-12-14 NOTE — ED Triage Notes (Signed)
Pt c/o sore throat x3 days, denies any fevers,chills or bodyaches.

## 2022-12-14 NOTE — ED Provider Notes (Signed)
[facility]    CSN: [id number] Arrival date & time: 12/14/22  0804      History   Chief Complaint Chief Complaint  Patient presents with   Sore Throat    HPI Alexandra Hardy is a 32 y.o. female.   HPI  32 year old female here for evaluation of sore throat.  The patient is here with family for evaluation of a sore throat this been present for last 3 days.  This morning she had a fever with a temperature of 101.  She is afebrile in clinic now at 98.2.  She has taken NyQuil but no Tylenol or ibuprofen.  She is complaining of significant pain in her throat as well as pain in her left ear.  She denies any runny nose or nasal congestion and she denies cough.  Past Medical History:  Diagnosis Date   Gestational diabetes    History of HPV infection     Patient Active Problem List   Diagnosis Date Noted   Umbilical hernia without obstruction and without gangrene    Normal labor 04/12/2018   SVD (spontaneous vaginal delivery) 04/12/2018   History of gestational diabetes in prior pregnancy, currently pregnant 11/02/2017   Supervision of high risk pregnancy, antepartum, first trimester 10/05/2017   Pap smear of cervix shows high risk HPV present 05/14/2015   Language barrier affecting health care 03/12/2015    Past Surgical History:  Procedure Laterality Date   UMBILICAL HERNIA REPAIR N/A 05/27/2020   Procedure: HERNIA REPAIR UMBILICAL ADULT, Open with mesh;  Surgeon: Olean Ree, MD;  Location: [facility] ORS;  Service: General;  Laterality: N/A;    OB History     Gravida  2   Para  2   Term  2   Preterm      AB      Living  2      SAB      IAB      Ectopic      Multiple      Live Births  2            Home Medications    Prior to Admission medications   Medication Sig Start Date End Date Taking? Authorizing Provider  amoxicillin-clavulanate (AUGMENTIN) 875-125 MG tablet Take 1 tablet by mouth every 12 (twelve) hours for 10  days. 12/14/22 12/24/22 Yes Margarette Canada, NP  cetirizine (ZYRTEC) 10 MG tablet Take 10 mg by mouth daily.   Yes [provider]  ergocalciferol (DRISDOL) 1.25 MG (50000 UT) capsule Take 1 capsule (50,000 Units total) by mouth once a week. 04/05/22  Yes Newlin, Charlane Ferretti, MD  fluticasone (FLONASE) 50 MCG/ACT nasal spray Place 2 sprays into both nostrils daily. 03/31/22  Yes Charlott Rakes, MD  ibuprofen (ADVIL) 600 MG tablet Take 1 tablet (600 mg total) by mouth every 6 (six) hours as needed. 12/14/22  Yes Margarette Canada, NP  Multiple Vitamin (MULTIVITAMIN PO) Take by mouth.   Yes [provider]    Family History Family History  Problem Relation Age of Onset   Diabetes Father     Social History Social History   Tobacco Use   Smoking status: Never   Smokeless tobacco: Never  Vaping Use   Vaping Use: Never used  Substance Use Topics   Alcohol use: Not Currently   Drug use: No     Allergies   Patient has no known allergies.   Review of Systems Review of Systems  Constitutional:  Positive for fever.  HENT:  Positive for ear pain and sore throat. Negative for congestion, ear discharge and rhinorrhea.   Respiratory:  Negative for cough.      Physical Exam Triage Vital Signs ED Triage Vitals  Enc Vitals Group     BP 12/14/22 0832 102/76     Pulse Rate 12/14/22 0832 70     Resp 12/14/22 0832 16     Temp 12/14/22 0832 98.2 F (36.8 C)     Temp Source 12/14/22 0832 Oral     SpO2 12/14/22 0832 100 %     Weight 12/14/22 0830 135 lb (61.2 kg)     Height 12/14/22 0830 5\' 2"  (1.575 m)     Head Circumference --      Peak Flow --      Pain Score 12/14/22 0830 8     Pain Loc --      Pain Edu? --      Excl. in [place]? --    No data found.  Updated Vital Signs BP 102/76 (BP Location: Left Arm)   Pulse 70   Temp 98.2 F (36.8 C) (Oral)   Resp 16   Ht 5\' 2"  (1.575 m)   Wt 135 lb (61.2 kg)   LMP 12/10/2022   SpO2 100%   BMI 24.69 kg/m   Visual Acuity Right  Eye Distance:   Left Eye Distance:   Bilateral Distance:    Right Eye Near:   Left Eye Near:    Bilateral Near:     Physical Exam Vitals and nursing note reviewed.  Constitutional:      Appearance: Normal appearance. She is not ill-appearing.  HENT:     Head: Normocephalic and atraumatic.     Right Ear: Tympanic membrane, ear canal and external ear normal. There is no impacted cerumen.     Left Ear: Tympanic membrane, ear canal and external ear normal. There is no impacted cerumen.     Nose: Nose normal.     Mouth/Throat:     Mouth: Mucous membranes are moist.     Pharynx: Oropharyngeal exudate and posterior oropharyngeal erythema present.  Cardiovascular:     Rate and Rhythm: Normal rate and regular rhythm.     Pulses: Normal pulses.     Heart sounds: Normal heart sounds. No murmur heard.    No friction rub. No gallop.  Pulmonary:     Effort: Pulmonary effort is normal.     Breath sounds: Normal breath sounds. No wheezing, rhonchi or rales.  Musculoskeletal:     Cervical back: Normal range of motion and neck supple.  Lymphadenopathy:     Cervical: Cervical adenopathy present.  Skin:    General: Skin is warm and dry.     Capillary Refill: Capillary refill takes less than 2 seconds.     Findings: No erythema or rash.  Neurological:     General: No focal deficit present.     Mental Status: She is alert and oriented to person, place, and time.  Psychiatric:        Mood and Affect: Mood normal.        Behavior: Behavior normal.        Thought Content: Thought content normal.        Judgment: Judgment normal.      UC Treatments / Results  Labs (all labs ordered are listed, but only abnormal results are displayed) Labs Reviewed  GROUP A STREP BY PCR - Abnormal; Notable for the following components:  Result Value   Group A Strep by PCR DETECTED (*)    All other components within normal limits    EKG   Radiology No results found.  Procedures Procedures  (including critical care time)  Medications Ordered in UC Medications  ibuprofen (ADVIL) tablet 600 mg (600 mg Oral Given 12/14/22 0906)    Initial Impression / Assessment and Plan / UC Course  I have reviewed the triage vital signs and the nursing notes.  Pertinent labs & imaging results that were available during my care of the patient were reviewed by me and considered in my medical decision making (see chart for details).   Patient is a pleasant, nontoxic-appearing 32 year old female here for evaluation of 3 days with a sore throat pain as well as left ear pain and a fever of 101.  The fever started today.  She has not been using any over-the-counter analgesia to help with her sore throat and ear pain but she did take NyQuil last night.  On exam patient does have erythema and edema bilateral tonsillar pillars with white exudate.  She also has anterior cervical of adenopathy on exam.  Her ears are benign.  Cardiopulmonary exam is also benign.  Strep PCR collected at triage and is positive.  I will treat patient with Augmentin 875 mg twice daily for 10 days for treatment of strep pharyngitis.  I will also have staff administer a dose of ibuprofen 600 mg now and I will prescribe 600 mg that she can take every 6 hours with food as needed for pain.  We also discussed salt water gargles and over-the-counter Chloraseptic and Sucrets lozenges for pain control.  Return precautions reviewed.  No need for work note per patient and family.   Final Clinical Impressions(s) / UC Diagnoses   Final diagnoses:  Strep throat     Discharge Instructions      Take the Augmentin twice daily for 10 days for treatment of your strep throat.  Gargle with warm salt water 2-3 times a day to soothe your throat, aid in pain relief, and aid in healing.  Take Ibuprofen 600 mg every 6 hours with food for pain and fever.  You can also use Chloraseptic or Sucrets lozenges, 1 lozenge every 2 hours as needed for throat  pain.  If you develop any new or worsening symptoms return for reevaluation.     ED Prescriptions     Medication Sig Dispense Auth. Provider   amoxicillin-clavulanate (AUGMENTIN) 875-125 MG tablet Take 1 tablet by mouth every 12 (twelve) hours for 10 days. 20 tablet Becky Augusta, NP   ibuprofen (ADVIL) 600 MG tablet Take 1 tablet (600 mg total) by mouth every 6 (six) hours as needed. 30 tablet Becky Augusta, NP      PDMP not reviewed this encounter.   Becky Augusta, NP 12/14/22 [phone]

## 2022-12-14 NOTE — Discharge Instructions (Signed)
Take the Augmentin twice daily for 10 days for treatment of your strep throat.  Gargle with warm salt water 2-3 times a day to soothe your throat, aid in pain relief, and aid in healing.  Take Ibuprofen 600 mg every 6 hours with food for pain and fever.  You can also use Chloraseptic or Sucrets lozenges, 1 lozenge every 2 hours as needed for throat pain.  If you develop any new or worsening symptoms return for reevaluation.

## 2023-02-08 ENCOUNTER — Ambulatory Visit: Payer: Self-pay

## 2023-02-16 ENCOUNTER — Other Ambulatory Visit: Payer: Self-pay | Admitting: Hematology and Oncology

## 2023-02-16 DIAGNOSIS — Z124 Encounter for screening for malignant neoplasm of cervix: Secondary | ICD-10-CM

## 2023-02-16 NOTE — Progress Notes (Signed)
Patient: Alexandra Hardy           Date of Birth: Apr 19, 1991           MRN: [id number] Visit Date: 02/16/2023 PCP: Antony Blackbird, MD     Cervical Exam Pap smear completed: Pap test Abnormal Observations: Normal exam Recommendations: Patient has history of abnormal Pap smear. Normal exam in 2023. If normal today, we will still repeat Pap next year to ensure 3 normals in a row.       Patient's History Patient Active Problem List   Diagnosis Date Noted   Umbilical hernia without obstruction and without gangrene    Normal labor 04/12/2018   SVD (spontaneous vaginal delivery) 04/12/2018   History of gestational diabetes in prior pregnancy, currently pregnant 11/02/2017   Supervision of high risk pregnancy, antepartum, first trimester 10/05/2017   Pap smear of cervix shows high risk HPV present 05/14/2015   Language barrier affecting health care 03/12/2015   Past Medical History:  Diagnosis Date   Gestational diabetes    History of HPV infection     Family History  Problem Relation Age of Onset   Diabetes Father     Social History   Occupational History   Not on file  Tobacco Use   Smoking status: Never   Smokeless tobacco: Never  Vaping Use   Vaping Use: Never used  Substance and Sexual Activity   Alcohol use: Not Currently   Drug use: No   Sexual activity: Yes    Partners: Male    Birth control/protection: None

## 2023-02-18 LAB — CYTOLOGY - PAP
Adequacy: ABSENT
Comment: NEGATIVE
Diagnosis: NEGATIVE
High risk HPV: NEGATIVE

## 2023-03-08 ENCOUNTER — Telehealth: Payer: Self-pay

## 2023-03-08 NOTE — Telephone Encounter (Signed)
Patient called to receive her pap results. Informed patient via [employer] [phone] that pap smear was normal and HPV was negative. Based on this result and her previous hx her next pap smear will be due in 1 year. Patient voiced understanding.

## 2023-05-30 IMAGING — MG DIGITAL DIAGNOSTIC BILAT W/ TOMO W/ CAD
6 of 10 series · 6 of 30 positions shown · non-contrast
Comparison: None.

CLINICAL DATA: 30-year-old female presenting for evaluation of a
palpable tender lump in the periareolar right breast.

EXAM:
DIGITAL DIAGNOSTIC BILATERAL MAMMOGRAM WITH TOMOSYNTHESIS AND CAD;
ULTRASOUND RIGHT BREAST LIMITED
TECHNIQUE: Bilateral digital diagnostic mammography and breast tomosynthesis
was performed. The images were evaluated with computer-aided
detection.; Targeted ultrasound examination of the right breast was
performed

[L MLO synth-2D]
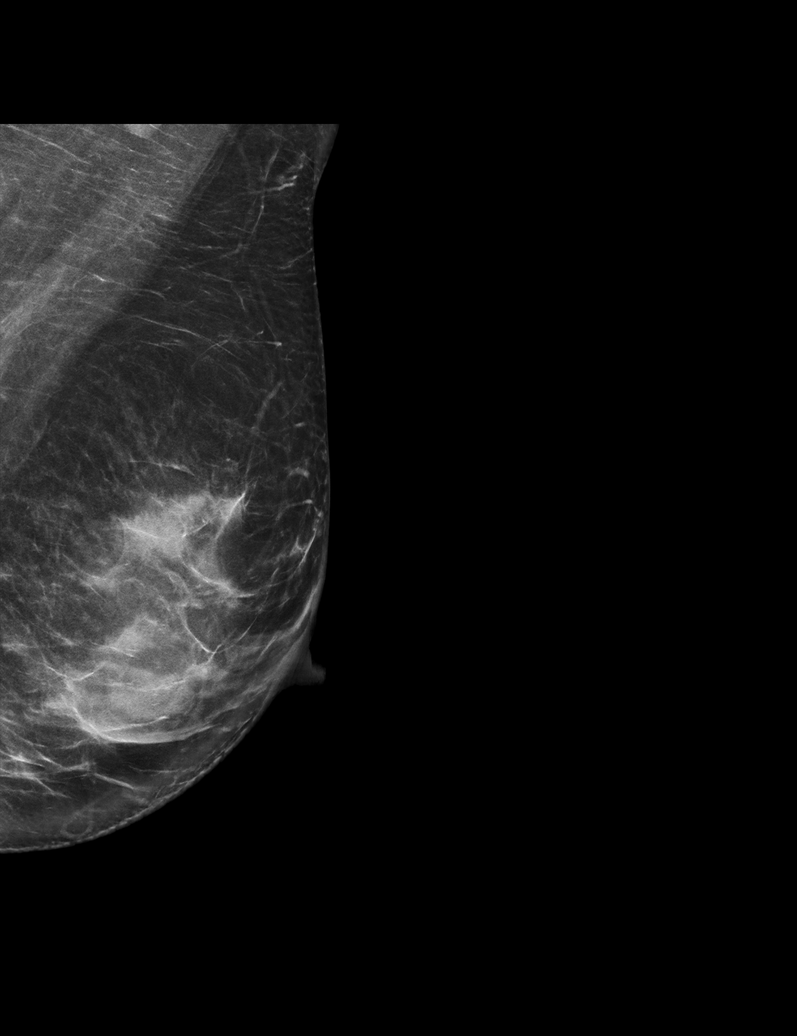

[R MLO synth-2D]
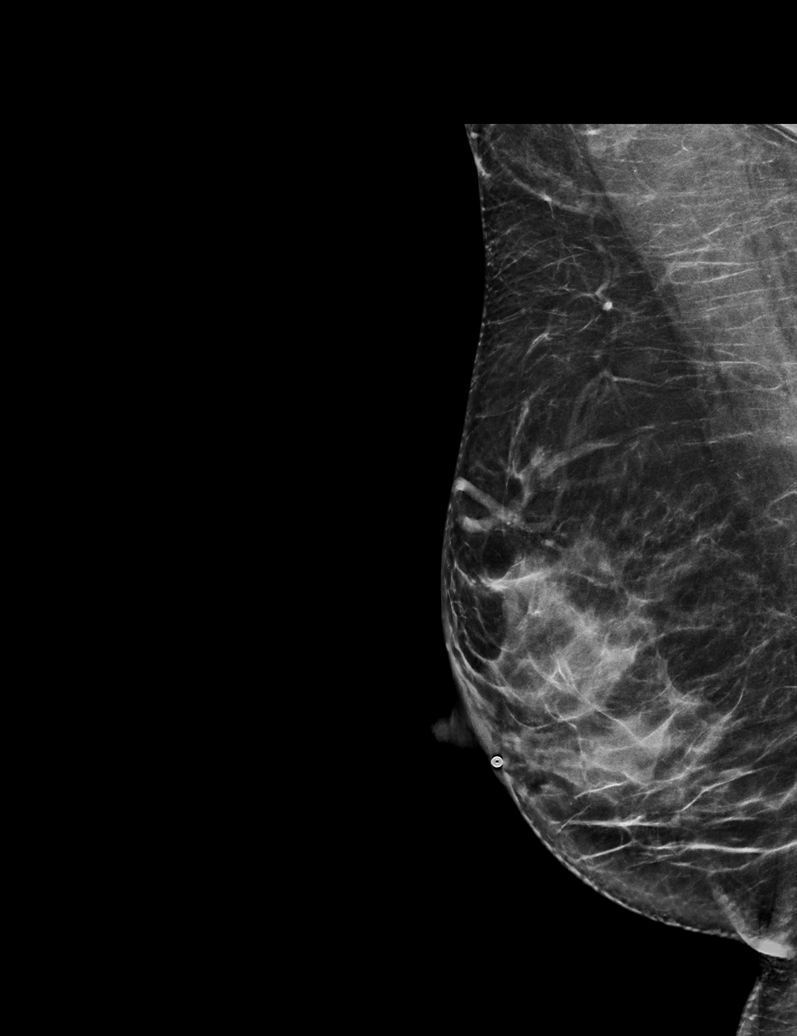

[L CC synth-2D]
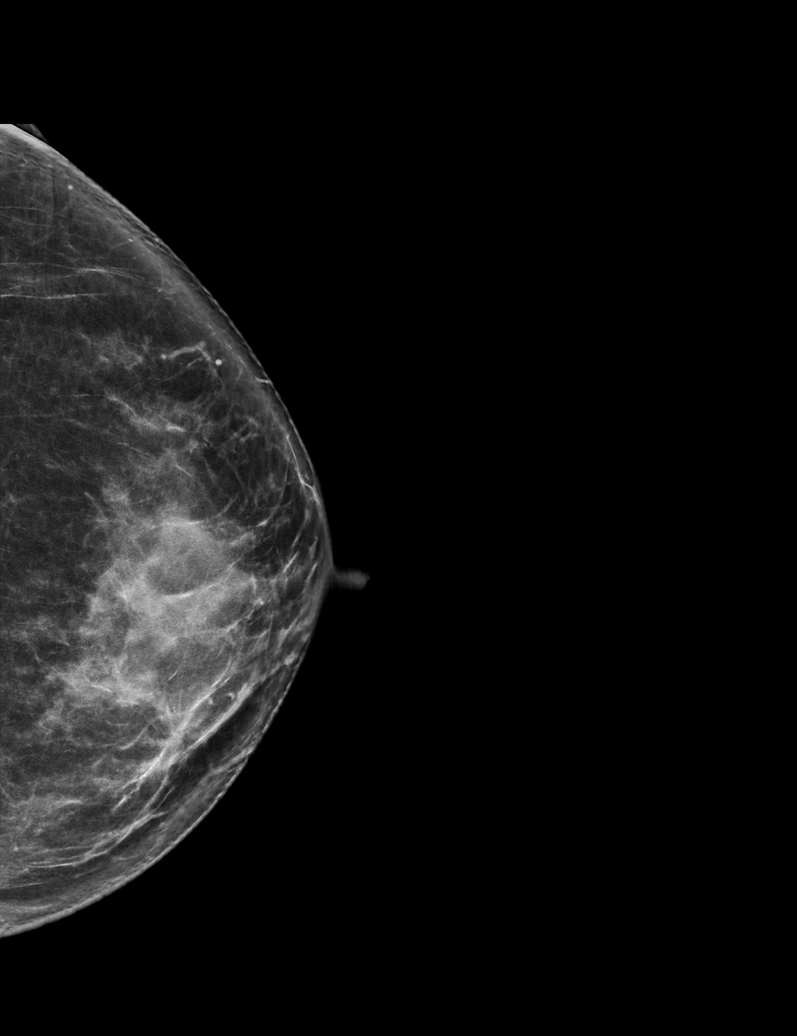

[R CC synth-2D]
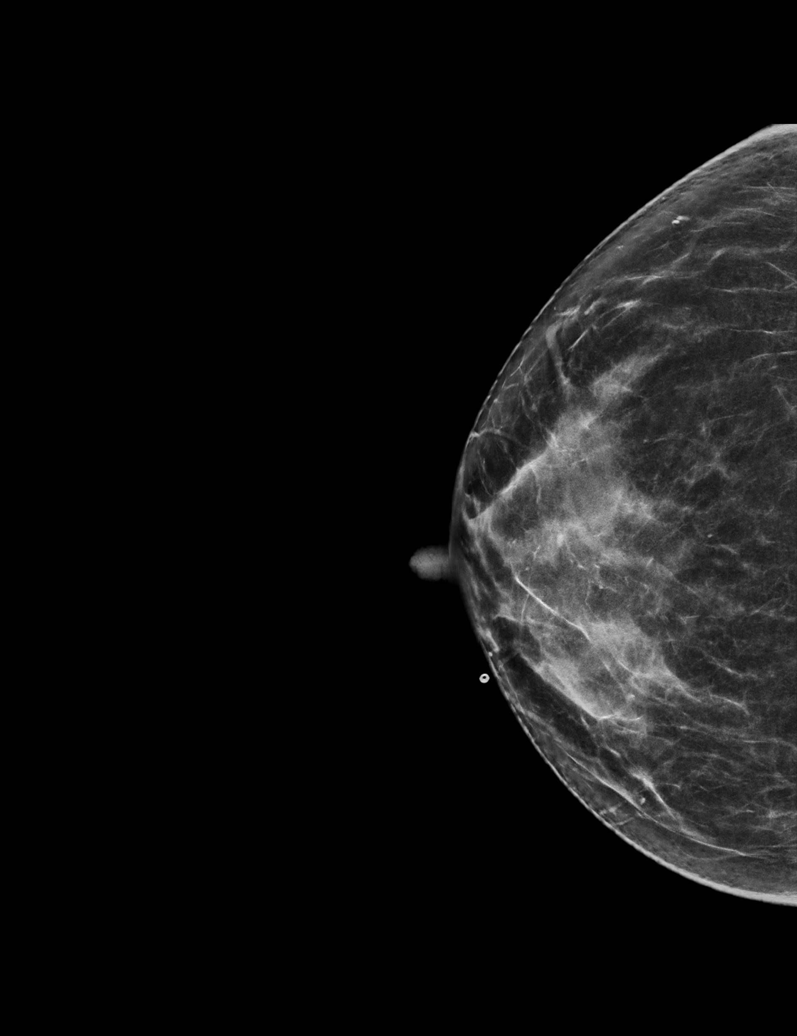

[R TAN synth-2D]
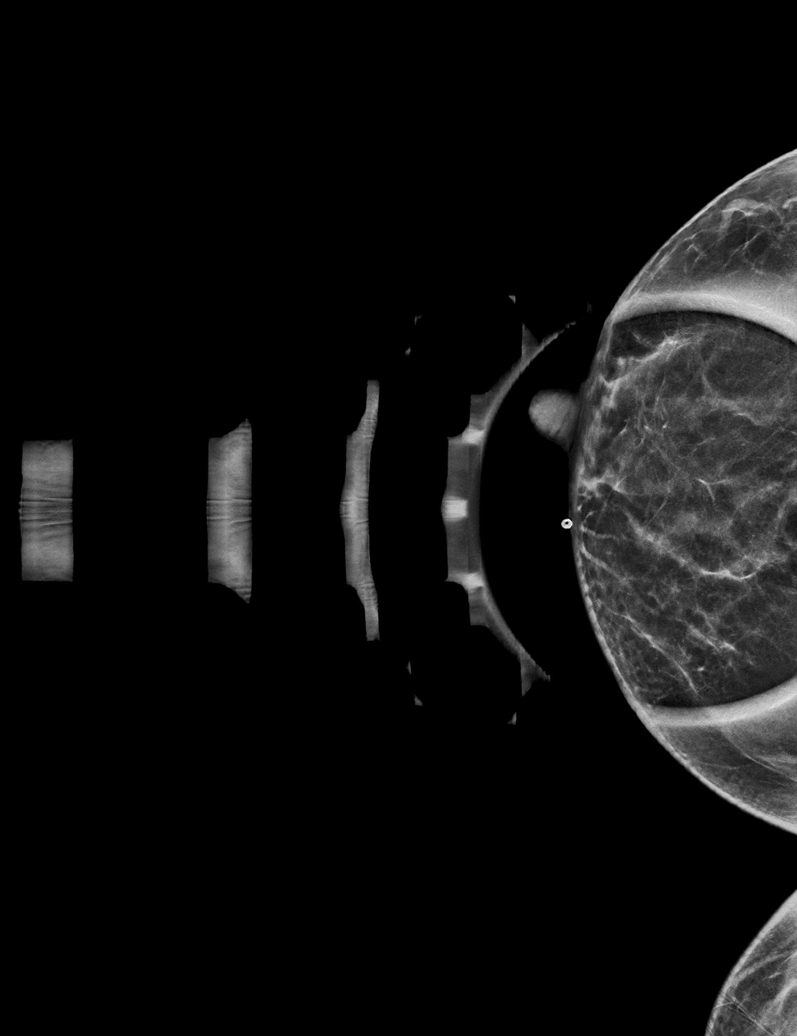

[R MLO tomo · tomo slice 33/65.0]
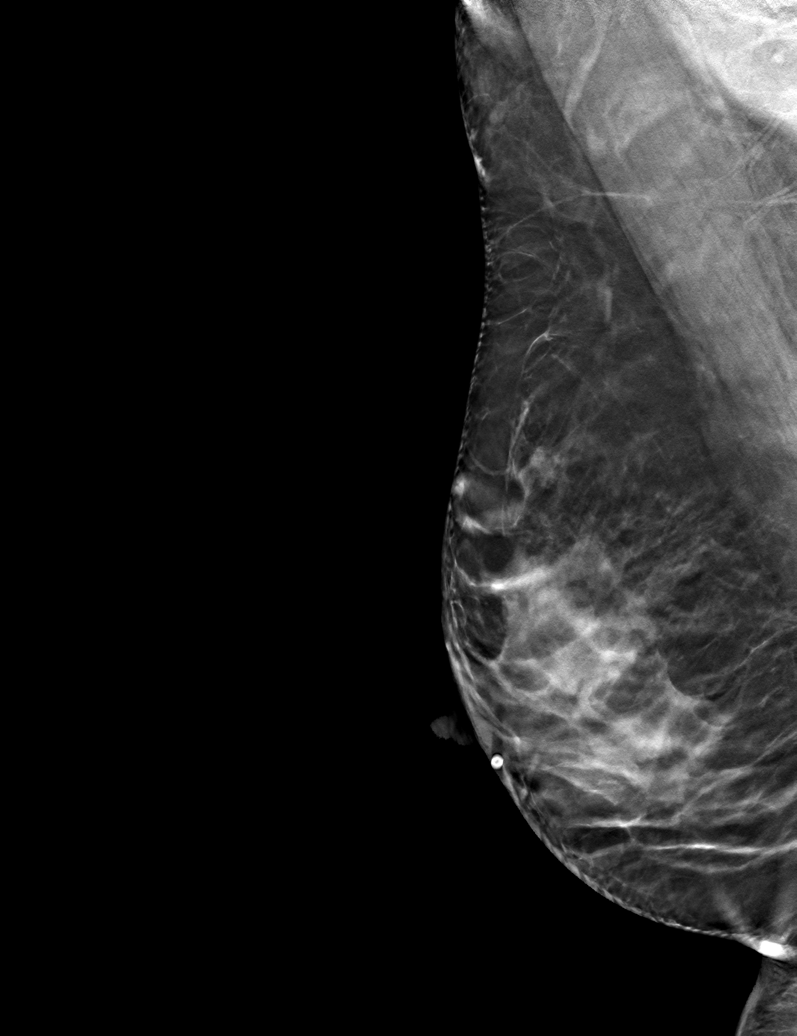

[6 of 30 positions shown; findings below may reference images not displayed]

ACR Breast Density Category c: The breast tissue is heterogeneously
dense, which may obscure small masses.
FINDINGS: A BB has been placed along the medial periareolar aspect of the
right breast indicating the palpable site of concern. There are no
suspicious mammographic findings deep to the marker. No suspicious
calcifications, masses or areas of distortion are seen in the
bilateral breasts.

Mammographic images were processed with CAD.

Physical exam of the palpable site in the right breast demonstrates
no discrete palpable masses.

Ultrasound targeted to the medial right breast demonstrates normal
fibroglandular tissue. No masses or suspicious areas of shadowing
are identified.
IMPRESSION: 1. There are no suspicious mammographic or targeted sonographic
abnormalities at the palpable site of concern in the medial right
breast.

2.  No mammographic evidence of malignancy in the bilateral breasts.

RECOMMENDATION:
1. Clinical follow-up recommended for the palpable area of concern
in the right breast. Any further workup should be based on clinical
grounds.

2. Screening mammogram at age 38 unless there are persistent or
intervening clinical concerns. (Code:01-B-OLA)

I have discussed the findings and recommendations with the patient.
If applicable, a reminder letter will be sent to the patient
regarding the next appointment.

BI-RADS CATEGORY  1: Negative.

## 2023-08-30 DIAGNOSIS — Z603 Acculturation difficulty: Secondary | ICD-10-CM | POA: Diagnosis present

## 2023-08-30 DIAGNOSIS — Z1152 Encounter for screening for COVID-19: Secondary | ICD-10-CM

## 2023-08-30 DIAGNOSIS — Z79899 Other long term (current) drug therapy: Secondary | ICD-10-CM

## 2023-08-30 DIAGNOSIS — K353 Acute appendicitis with localized peritonitis, without perforation or gangrene: Principal | ICD-10-CM | POA: Diagnosis present

## 2023-08-30 DIAGNOSIS — Z8632 Personal history of gestational diabetes: Secondary | ICD-10-CM

## 2023-08-30 DIAGNOSIS — Z833 Family history of diabetes mellitus: Secondary | ICD-10-CM

## 2023-08-30 DIAGNOSIS — Z8619 Personal history of other infectious and parasitic diseases: Secondary | ICD-10-CM

## 2023-08-30 NOTE — ED Triage Notes (Signed)
Pt in with generalized body aches and fever since this am. Pt is also reporting LLQ pain. Denies any other symptoms other than fever and "achy bones". Denies any sick contacts

## 2023-08-31 ENCOUNTER — Emergency Department: Payer: Self-pay

## 2023-08-31 ENCOUNTER — Inpatient Hospital Stay
Admission: EM | Admit: 2023-08-31 | Discharge: 2023-09-02 | DRG: 399 | Disposition: A | Discharge status: Home / Self Care | Payer: Self-pay | Attending: General Surgery | Admitting: General Surgery

## 2023-08-31 ENCOUNTER — Other Ambulatory Visit: Payer: Self-pay

## 2023-08-31 ENCOUNTER — Observation Stay: Payer: Self-pay | Admitting: Anesthesiology

## 2023-08-31 ENCOUNTER — Encounter: Payer: Self-pay | Admitting: Emergency Medicine

## 2023-08-31 ENCOUNTER — Encounter
Admission: EM | Disposition: A | Discharge status: Home / Self Care | Payer: Self-pay | Source: Home / Self Care | Attending: General Surgery

## 2023-08-31 DIAGNOSIS — K353 Acute appendicitis with localized peritonitis, without perforation or gangrene: Principal | ICD-10-CM | POA: Diagnosis present

## 2023-08-31 HISTORY — PX: XI ROBOTIC LAPAROSCOPIC ASSISTED APPENDECTOMY: SHX6877

## 2023-08-31 LAB — COMPREHENSIVE METABOLIC PANEL
ALT: 19 U/L (ref 0–44)
AST: 24 U/L (ref 15–41)
Albumin: 3.9 g/dL (ref 3.5–5.0)
Alkaline Phosphatase: 87 U/L (ref 38–126)
Anion gap: 9 (ref 5–15)
BUN: 12 mg/dL (ref 6–20)
CO2: 23 mmol/L (ref 22–32)
Calcium: 8.2 mg/dL — ABNORMAL LOW (ref 8.9–10.3)
Chloride: 101 mmol/L (ref 98–111)
Creatinine, Ser: 0.69 mg/dL (ref 0.44–1.00)
GFR, Estimated: >60 mL/min (ref >60)
Glucose, Bld: 132 mg/dL — ABNORMAL HIGH (ref 70–99)
Potassium: 3.5 mmol/L (ref 3.5–5.1)
Sodium: 133 mmol/L — ABNORMAL LOW (ref 135–145)
Total Bilirubin: 0.8 mg/dL (ref 0.3–1.2)
Total Protein: 7.5 g/dL (ref 6.5–8.1)

## 2023-08-31 LAB — CBC WITH DIFFERENTIAL/PLATELET
Abs Immature Granulocytes: 0.04 10*3/uL (ref 0.00–0.07)
Basophils Absolute: 0 10*3/uL (ref 0.0–0.1)
Basophils Relative: 0 %
Eosinophils Absolute: 0 10*3/uL (ref 0.0–0.5)
Eosinophils Relative: 0 %
HCT: 37.3 % (ref 36.0–46.0)
Hemoglobin: 12.7 g/dL (ref 12.0–15.0)
Immature Granulocytes: 0 %
Lymphocytes Relative: 5 %
Lymphs Abs: 0.6 10*3/uL — ABNORMAL LOW (ref 0.7–4.0)
MCH: 31.9 pg (ref 26.0–34.0)
MCHC: 34 g/dL (ref 30.0–36.0)
MCV: 93.7 fL (ref 80.0–100.0)
Monocytes Absolute: 0.5 10*3/uL (ref 0.1–1.0)
Monocytes Relative: 4 %
Neutro Abs: 10.1 10*3/uL — ABNORMAL HIGH (ref 1.7–7.7)
Neutrophils Relative %: 91 %
Platelets: 184 10*3/uL (ref 150–400)
RBC: 3.98 MIL/uL (ref 3.87–5.11)
RDW: 12.1 % (ref 11.5–15.5)
WBC: 11.2 10*3/uL — ABNORMAL HIGH (ref 4.0–10.5)
nRBC: 0 % (ref 0.0–0.2)

## 2023-08-31 LAB — URINALYSIS, W/ REFLEX TO CULTURE (INFECTION SUSPECTED)
Bacteria, UA: NONE SEEN
Bilirubin Urine: NEGATIVE
Glucose, UA: NEGATIVE mg/dL
Hgb urine dipstick: NEGATIVE
Ketones, ur: NEGATIVE mg/dL
Nitrite: NEGATIVE
Protein, ur: NEGATIVE mg/dL
Specific Gravity, Urine: 1.019 (ref 1.005–1.030)
pH: 6 (ref 5.0–8.0)

## 2023-08-31 LAB — TYPE AND SCREEN
ABO/RH(D): O POS
Antibody Screen: NEGATIVE

## 2023-08-31 LAB — POC URINE PREG, ED: Preg Test, Ur: NEGATIVE

## 2023-08-31 LAB — HIV ANTIBODY (ROUTINE TESTING W REFLEX): HIV Screen 4th Generation wRfx: NONREACTIVE

## 2023-08-31 LAB — SARS CORONAVIRUS 2 BY RT PCR: SARS Coronavirus 2 by RT PCR: NEGATIVE

## 2023-08-31 LAB — LACTIC ACID, PLASMA: Lactic Acid, Venous: 1.4 mmol/L (ref 0.5–1.9)

## 2023-08-31 LAB — CBG MONITORING, ED: Glucose-Capillary: 149 mg/dL — ABNORMAL HIGH (ref 70–99)

## 2023-08-31 SURGERY — APPENDECTOMY, ROBOT-ASSISTED, LAPAROSCOPIC
Anesthesia: General | Site: Abdomen

## 2023-08-31 MED ORDER — PHENYLEPHRINE 80 MCG/ML (10ML) SYRINGE FOR IV PUSH (FOR BLOOD PRESSURE SUPPORT)
PREFILLED_SYRINGE | INTRAVENOUS | Status: AC
  Filled 2023-08-31: qty 10

## 2023-08-31 MED ORDER — ROCURONIUM BROMIDE 100 MG/10ML IV SOLN
INTRAVENOUS | Status: DC | PRN
  Administered 2023-08-31: 20 mg via INTRAVENOUS

## 2023-08-31 MED ORDER — FENTANYL CITRATE (PF) 100 MCG/2ML IJ SOLN
INTRAMUSCULAR | Status: DC | PRN
  Administered 2023-08-31: 100 ug via INTRAVENOUS

## 2023-08-31 MED ORDER — DEXAMETHASONE SODIUM PHOSPHATE 10 MG/ML IJ SOLN
INTRAMUSCULAR | Status: AC
  Filled 2023-08-31: qty 1

## 2023-08-31 MED ORDER — OXYCODONE HCL 5 MG PO TABS
5.0000 mg | ORAL_TABLET | Freq: Once | ORAL | Status: AC | PRN
  Administered 2023-08-31: 5 mg via ORAL

## 2023-08-31 MED ORDER — MORPHINE SULFATE (PF) 4 MG/ML IV SOLN
4.0000 mg | INTRAVENOUS | Status: DC | PRN
  Administered 2023-09-02: 4 mg via INTRAVENOUS

## 2023-08-31 MED ORDER — ONDANSETRON 4 MG PO TBDP: 4.0000 mg | ORAL_TABLET | Freq: Four times a day (QID) | ORAL | Status: DC | PRN

## 2023-08-31 MED ORDER — EPINEPHRINE PF 1 MG/ML IJ SOLN
INTRAMUSCULAR | Status: AC
  Filled 2023-08-31: qty 1

## 2023-08-31 MED ORDER — SODIUM CHLORIDE 0.9 % IV SOLN: INTRAVENOUS | Status: AC

## 2023-08-31 MED ORDER — HYDROCODONE-ACETAMINOPHEN 5-325 MG PO TABS
ORAL_TABLET | ORAL | Status: AC
  Filled 2023-08-31: qty 1

## 2023-08-31 MED ORDER — HYDROCODONE-ACETAMINOPHEN 5-325 MG PO TABS
ORAL_TABLET | ORAL | Status: AC
  Filled 2023-08-31: qty 2

## 2023-08-31 MED ORDER — LIDOCAINE HCL (CARDIAC) PF 100 MG/5ML IV SOSY
PREFILLED_SYRINGE | INTRAVENOUS | Status: DC | PRN
  Administered 2023-08-31: 60 mg via INTRAVENOUS

## 2023-08-31 MED ORDER — FENTANYL CITRATE (PF) 100 MCG/2ML IJ SOLN
INTRAMUSCULAR | Status: AC
  Filled 2023-08-31: qty 2

## 2023-08-31 MED ORDER — LIDOCAINE HCL (PF) 2 % IJ SOLN
INTRAMUSCULAR | Status: AC
  Filled 2023-08-31: qty 5

## 2023-08-31 MED ORDER — METHOCARBAMOL 500 MG PO TABS
500.0000 mg | ORAL_TABLET | Freq: Three times a day (TID) | ORAL | Status: DC
  Administered 2023-08-31 – 2023-09-02 (×5): 500 mg via ORAL

## 2023-08-31 MED ORDER — PIPERACILLIN-TAZOBACTAM 3.375 G IVPB
INTRAVENOUS | Status: AC
  Filled 2023-08-31: qty 50

## 2023-08-31 MED ORDER — ACETAMINOPHEN 10 MG/ML IV SOLN
INTRAVENOUS | Status: AC
  Filled 2023-08-31: qty 100

## 2023-08-31 MED ORDER — FENTANYL CITRATE (PF) 100 MCG/2ML IJ SOLN
25.0000 ug | INTRAMUSCULAR | Status: DC | PRN
  Administered 2023-08-31 (×4): 25 ug via INTRAVENOUS

## 2023-08-31 MED ORDER — BUPIVACAINE-EPINEPHRINE (PF) 0.5% -1:200000 IJ SOLN
INTRAMUSCULAR | Status: DC | PRN
  Administered 2023-08-31: 30 mL

## 2023-08-31 MED ORDER — MIDAZOLAM HCL 2 MG/2ML IJ SOLN
INTRAMUSCULAR | Status: DC | PRN
  Administered 2023-08-31: 2 mg via INTRAVENOUS

## 2023-08-31 MED ORDER — OXYCODONE HCL 5 MG/5ML PO SOLN: 5.0000 mg | Freq: Once | ORAL | Status: AC | PRN

## 2023-08-31 MED ORDER — FENTANYL CITRATE PF 50 MCG/ML IJ SOSY
50.0000 ug | PREFILLED_SYRINGE | Freq: Once | INTRAMUSCULAR | Status: AC
  Administered 2023-08-31: 50 ug via INTRAVENOUS
  Filled 2023-08-31: qty 1

## 2023-08-31 MED ORDER — ACETAMINOPHEN 650 MG RE SUPP: 650.0000 mg | Freq: Four times a day (QID) | RECTAL | Status: DC | PRN

## 2023-08-31 MED ORDER — PIPERACILLIN-TAZOBACTAM 3.375 G IVPB
3.3750 g | Freq: Three times a day (TID) | INTRAVENOUS | Status: DC
  Administered 2023-08-31 – 2023-09-02 (×7): 3.375 g via INTRAVENOUS
  Filled 2023-08-31 (×3): qty 50

## 2023-08-31 MED ORDER — ONDANSETRON HCL 4 MG/2ML IJ SOLN: 4.0000 mg | Freq: Once | INTRAMUSCULAR | Status: DC | PRN

## 2023-08-31 MED ORDER — SUCCINYLCHOLINE CHLORIDE 200 MG/10ML IV SOSY
PREFILLED_SYRINGE | INTRAVENOUS | Status: AC
  Filled 2023-08-31: qty 10

## 2023-08-31 MED ORDER — LACTATED RINGERS IV BOLUS
1000.0000 mL | Freq: Once | INTRAVENOUS | Status: AC
  Administered 2023-08-31: 1000 mL via INTRAVENOUS

## 2023-08-31 MED ORDER — SUGAMMADEX SODIUM 200 MG/2ML IV SOLN
INTRAVENOUS | Status: DC | PRN
  Administered 2023-08-31: 200 mg via INTRAVENOUS

## 2023-08-31 MED ORDER — ROCURONIUM BROMIDE 10 MG/ML (PF) SYRINGE
PREFILLED_SYRINGE | INTRAVENOUS | Status: AC
  Filled 2023-08-31: qty 10

## 2023-08-31 MED ORDER — ENOXAPARIN SODIUM 40 MG/0.4ML IJ SOSY
40.0000 mg | PREFILLED_SYRINGE | INTRAMUSCULAR | Status: DC
  Administered 2023-09-01 – 2023-09-02 (×2): 40 mg via SUBCUTANEOUS

## 2023-08-31 MED ORDER — PHENYLEPHRINE 80 MCG/ML (10ML) SYRINGE FOR IV PUSH (FOR BLOOD PRESSURE SUPPORT)
PREFILLED_SYRINGE | INTRAVENOUS | Status: DC | PRN
  Administered 2023-08-31: 80 ug via INTRAVENOUS
  Administered 2023-08-31 (×3): 160 ug via INTRAVENOUS
  Administered 2023-08-31: 80 ug via INTRAVENOUS
  Administered 2023-08-31 (×2): 160 ug via INTRAVENOUS

## 2023-08-31 MED ORDER — MIDAZOLAM HCL 2 MG/2ML IJ SOLN
INTRAMUSCULAR | Status: AC
  Filled 2023-08-31: qty 2

## 2023-08-31 MED ORDER — ONDANSETRON HCL 4 MG/2ML IJ SOLN
INTRAMUSCULAR | Status: AC
  Filled 2023-08-31: qty 2

## 2023-08-31 MED ORDER — LACTATED RINGERS IV SOLN: INTRAVENOUS | Status: DC | PRN

## 2023-08-31 MED ORDER — KETOROLAC TROMETHAMINE 30 MG/ML IJ SOLN
30.0000 mg | Freq: Three times a day (TID) | INTRAMUSCULAR | Status: DC
  Administered 2023-08-31 – 2023-09-02 (×5): 30 mg via INTRAVENOUS

## 2023-08-31 MED ORDER — HYDROCODONE-ACETAMINOPHEN 5-325 MG PO TABS
1.0000 | ORAL_TABLET | ORAL | Status: DC | PRN
  Administered 2023-08-31 (×2): 2 via ORAL
  Administered 2023-08-31 – 2023-09-01 (×3): 1 via ORAL

## 2023-08-31 MED ORDER — SUCCINYLCHOLINE CHLORIDE 200 MG/10ML IV SOSY
PREFILLED_SYRINGE | INTRAVENOUS | Status: DC | PRN
  Administered 2023-08-31: 80 mg via INTRAVENOUS

## 2023-08-31 MED ORDER — ACETAMINOPHEN 325 MG PO TABS: 650.0000 mg | ORAL_TABLET | Freq: Four times a day (QID) | ORAL | Status: DC | PRN

## 2023-08-31 MED ORDER — METHOCARBAMOL 500 MG PO TABS
ORAL_TABLET | ORAL | Status: AC
  Filled 2023-08-31: qty 1

## 2023-08-31 MED ORDER — ONDANSETRON HCL 4 MG/2ML IJ SOLN
INTRAMUSCULAR | Status: DC | PRN
  Administered 2023-08-31: 4 mg via INTRAVENOUS

## 2023-08-31 MED ORDER — KETOROLAC TROMETHAMINE 30 MG/ML IJ SOLN
INTRAMUSCULAR | Status: AC
  Filled 2023-08-31: qty 1

## 2023-08-31 MED ORDER — OXYCODONE HCL 5 MG PO TABS
ORAL_TABLET | ORAL | Status: AC
  Filled 2023-08-31: qty 1

## 2023-08-31 MED ORDER — ACETAMINOPHEN 10 MG/ML IV SOLN
INTRAVENOUS | Status: DC | PRN
Start: 2023-08-31 — End: 2023-08-31
  Administered 2023-08-31: 1000 mg via INTRAVENOUS

## 2023-08-31 MED ORDER — PROPOFOL 10 MG/ML IV BOLUS
INTRAVENOUS | Status: AC
  Filled 2023-08-31: qty 20

## 2023-08-31 MED ORDER — PIPERACILLIN-TAZOBACTAM 3.375 G IVPB 30 MIN: 3.3750 g | Freq: Once | INTRAVENOUS | Status: DC

## 2023-08-31 MED ORDER — BUPIVACAINE HCL (PF) 0.5 % IJ SOLN
INTRAMUSCULAR | Status: AC
  Filled 2023-08-31: qty 30

## 2023-08-31 MED ORDER — DEXAMETHASONE SODIUM PHOSPHATE 10 MG/ML IJ SOLN
INTRAMUSCULAR | Status: DC | PRN
  Administered 2023-08-31: 10 mg via INTRAVENOUS

## 2023-08-31 MED ORDER — KETOROLAC TROMETHAMINE 30 MG/ML IJ SOLN
15.0000 mg | Freq: Once | INTRAMUSCULAR | Status: AC
  Administered 2023-08-31: 15 mg via INTRAVENOUS
  Filled 2023-08-31: qty 1

## 2023-08-31 MED ORDER — ACETAMINOPHEN 10 MG/ML IV SOLN: 1000.0000 mg | Freq: Once | INTRAVENOUS | Status: DC | PRN

## 2023-08-31 MED ORDER — IOHEXOL 300 MG/ML  SOLN
100.0000 mL | Freq: Once | INTRAMUSCULAR | Status: AC | PRN
  Administered 2023-08-31: 100 mL via INTRAVENOUS

## 2023-08-31 MED ORDER — PROPOFOL 10 MG/ML IV BOLUS
INTRAVENOUS | Status: DC | PRN
  Administered 2023-08-31: 120 mg via INTRAVENOUS

## 2023-08-31 MED ORDER — ONDANSETRON HCL 4 MG/2ML IJ SOLN
4.0000 mg | Freq: Four times a day (QID) | INTRAMUSCULAR | Status: DC | PRN
  Administered 2023-08-31: 4 mg via INTRAVENOUS

## 2023-08-31 SURGICAL SUPPLY — 59 items
ADH SKN CLS APL DERMABOND .7 (GAUZE/BANDAGES/DRESSINGS) ×1
BAG PRESSURE INF REUSE 1000 (BAG) IMPLANT
BLADE SURG SZ11 CARB STEEL (BLADE) ×1 IMPLANT
CANNULA REDUCER 12-8 DVNC XI (CANNULA) ×1 IMPLANT
COVER TIP SHEARS 8 DVNC (MISCELLANEOUS) ×1 IMPLANT
DERMABOND ADVANCED .7 DNX12 (GAUZE/BANDAGES/DRESSINGS) ×1 IMPLANT
DRAPE ARM DVNC X/XI (DISPOSABLE) ×4 IMPLANT
DRAPE COLUMN DVNC XI (DISPOSABLE) ×1 IMPLANT
ELECT REM PT RETURN 9FT ADLT (ELECTROSURGICAL) ×1
ELECTRODE REM PT RTRN 9FT ADLT (ELECTROSURGICAL) ×1 IMPLANT
FORCEPS BPLR FENES DVNC XI (FORCEP) ×1 IMPLANT
GLOVE BIOGEL PI IND STRL 6.5 (GLOVE) ×2 IMPLANT
GLOVE SURG SYN 6.5 ES PF (GLOVE) ×3 IMPLANT
GLOVE SURG SYN 6.5 PF PI (GLOVE) ×2 IMPLANT
GOWN STRL REUS W/ TWL LRG LVL3 (GOWN DISPOSABLE) ×3 IMPLANT
GOWN STRL REUS W/TWL LRG LVL3 (GOWN DISPOSABLE) ×3
GRASPER SUT TROCAR 14GX15 (MISCELLANEOUS) IMPLANT
GRASPER TIP-UP FEN DVNC XI (INSTRUMENTS) ×1 IMPLANT
IRRIGATOR SUCT 8 DISP DVNC XI (IRRIGATION / IRRIGATOR) IMPLANT
IV NS 1000ML (IV SOLUTION)
IV NS 1000ML BAXH (IV SOLUTION) IMPLANT
KIT PINK PAD W/HEAD ARE REST (MISCELLANEOUS) ×1 IMPLANT
KIT PINK PAD W/HEAD ARM REST (MISCELLANEOUS) ×1 IMPLANT
LABEL OR SOLS (LABEL) IMPLANT
MANIFOLD NEPTUNE II (INSTRUMENTS) ×1 IMPLANT
NDL DRIVE SUT CUT DVNC (INSTRUMENTS) ×1 IMPLANT
NDL HYPO 22X1.5 SAFETY MO (MISCELLANEOUS) ×1 IMPLANT
NDL INSUFFLATION 14GA 120MM (NEEDLE) ×1 IMPLANT
NEEDLE DRIVE SUT CUT DVNC (INSTRUMENTS) ×1 IMPLANT
NEEDLE HYPO 22X1.5 SAFETY MO (MISCELLANEOUS) ×1 IMPLANT
NEEDLE INSUFFLATION 14GA 120MM (NEEDLE) ×1 IMPLANT
OBTURATOR OPTICAL STND 8 DVNC (TROCAR) ×1
OBTURATOR OPTICALSTD 8 DVNC (TROCAR) ×1 IMPLANT
PACK LAP CHOLECYSTECTOMY (MISCELLANEOUS) ×1 IMPLANT
RELOAD STAPLE 45 2.5 WHT DVNC (STAPLE) IMPLANT
RELOAD STAPLE 45 3.5 BLU DVNC (STAPLE) IMPLANT
RELOAD STAPLER 2.5X45 WHT DVNC (STAPLE) IMPLANT
RELOAD STAPLER 3.5X45 BLU DVNC (STAPLE) IMPLANT
SCISSORS MNPLR CVD DVNC XI (INSTRUMENTS) ×1 IMPLANT
SEAL UNIV 5-12 XI (MISCELLANEOUS) ×4 IMPLANT
SEALER VESSEL EXT DVNC XI (MISCELLANEOUS) IMPLANT
SET TUBE SMOKE EVAC HIGH FLOW (TUBING) ×1 IMPLANT
SOL ELECTROSURG ANTI STICK (MISCELLANEOUS) ×1
SOLUTION ELECTROSURG ANTI STCK (MISCELLANEOUS) ×1 IMPLANT
SPONGE T-LAP 4X18 ~~LOC~~+RFID (SPONGE) ×1 IMPLANT
STAPLER 45 SUREFORM DVNC (STAPLE) IMPLANT
STAPLER RELOAD 2.5X45 WHT DVNC (STAPLE)
STAPLER RELOAD 3.5X45 BLU DVNC (STAPLE)
SUT MNCRL AB 4-0 PS2 18 (SUTURE) ×1 IMPLANT
SUT VIC AB 2-0 SH 27 (SUTURE)
SUT VIC AB 2-0 SH 27XBRD (SUTURE) ×1 IMPLANT
SUT VICRYL 0 UR6 27IN ABS (SUTURE) ×1 IMPLANT
SUT VLOC 90 6 CV-15 VIOLET (SUTURE) ×1 IMPLANT
SYR 30ML LL (SYRINGE) ×1 IMPLANT
SYS BAG RETRIEVAL 10MM (BASKET) ×1
SYSTEM BAG RETRIEVAL 10MM (BASKET) ×1 IMPLANT
TRAP FLUID SMOKE EVACUATOR (MISCELLANEOUS) ×1 IMPLANT
TRAY FOLEY MTR SLVR 16FR STAT (SET/KITS/TRAYS/PACK) IMPLANT
WATER STERILE IRR 500ML POUR (IV SOLUTION) ×1 IMPLANT

## 2023-08-31 NOTE — Anesthesia Procedure Notes (Addendum)
Procedure Name: Intubation Date/Time: 08/31/2023 5:06 AM  Performed by: Derenda Mis, CRNAPre-anesthesia Checklist: Patient identified, Emergency Drugs available, Suction available and Patient being monitored Patient Re-evaluated:Patient Re-evaluated prior to induction Oxygen Delivery Method: Circle system utilized Preoxygenation: Pre-oxygenation with 100% oxygen Induction Type: IV induction and Rapid sequence Laryngoscope Size: Mac and 3 Grade View: Grade II Tube type: Oral Tube size: 6.5 mm Number of attempts: 1 Placement Confirmation: ETT inserted through vocal cords under direct vision, positive ETCO2 and breath sounds checked- equal and bilateral Tube secured with: Tape Dental Injury: Teeth and Oropharynx as per pre-operative assessment

## 2023-08-31 NOTE — Anesthesia Postprocedure Evaluation (Signed)
Anesthesia Post Note  Patient: Alexandra Hardy  Procedure(s) Performed: XI ROBOTIC LAPAROSCOPIC ASSISTED APPENDECTOMY (Abdomen)  Patient location during evaluation: PACU Anesthesia Type: General Level of consciousness: awake and alert Pain management: pain level controlled Vital Signs Assessment: post-procedure vital signs reviewed and stable Respiratory status: spontaneous breathing, nonlabored ventilation, respiratory function stable and patient connected to nasal cannula oxygen Cardiovascular status: blood pressure returned to baseline and stable Postop Assessment: no apparent nausea or vomiting Anesthetic complications: no   No notable events documented.   Last Vitals:  Vitals:   08/31/23 0611 08/31/23 0616  BP: (!) 98/56 (!) 105/56  Pulse: 89 89  Resp: 14 19  Temp: 36.8 C   SpO2: 100% 96%    Last Pain:  Vitals:   08/31/23 0616  TempSrc:   PainSc: Asleep                 Corinda Gubler

## 2023-08-31 NOTE — Transfer of Care (Signed)
Immediate Anesthesia Transfer of Care Note  Patient: Alexandra Hardy  Procedure(s) Performed: XI ROBOTIC LAPAROSCOPIC ASSISTED APPENDECTOMY (Abdomen)  Patient Location: PACU  Anesthesia Type:General  Level of Consciousness: drowsy  Airway & Oxygen Therapy: Patient connected to face mask oxygen  Post-op Assessment: Report given to RN  Post vital signs: stable  Last Vitals:  Vitals Value Taken Time  BP 98/56 08/31/23 0611  Temp 36.8 C 08/31/23 0611  Pulse 86 08/31/23 0611  Resp 15 08/31/23 0611  SpO2 100 % 08/31/23 0611  Vitals shown include unfiled device data.  Last Pain:  Vitals:   08/31/23 0420  TempSrc: Oral         Complications: No notable events documented.

## 2023-08-31 NOTE — H&P (Signed)
SURGICAL CONSULTATION NOTE   HISTORY OF PRESENT ILLNESS (HPI):  32 y.o. female presented to [facility] ED for evaluation of . Patient reports she has been having abdominal pain since last night.  She endorses that the pain is in the lower abdomen and radiates to the left side of the abdomen.  Pain also goes all the way down to the right side.  Pain also radiates to the back.  Patient cannot identify any alleviating or aggravating factors.  At the ED she was found with my leukocytosis.  She had fever of 100.6.  She is tachycardic.  She has tenderness to palpation in the left side of the abdomen.  She had a CT scan of the abdomen and pelvis that shows fat stranding around the appendix.  No sign of perforation.  I personally evaluated the images.  Surgery is consulted by Dr. Katrinka Blazing in this context for evaluation and management of acute appendicitis.  PAST MEDICAL HISTORY (PMH):  Past Medical History:  Diagnosis Date   Gestational diabetes    History of HPV infection      PAST SURGICAL HISTORY (PSH):  Past Surgical History:  Procedure Laterality Date   UMBILICAL HERNIA REPAIR N/A 05/27/2020   Procedure: HERNIA REPAIR UMBILICAL ADULT, Open with mesh;  Surgeon: Henrene Dodge, MD;  Location: [facility] ORS;  Service: General;  Laterality: N/A;     MEDICATIONS:  Prior to Admission medications   Medication Sig Start Date End Date Taking? Authorizing Provider  cetirizine (ZYRTEC) 10 MG tablet Take 10 mg by mouth daily.    [provider]  ergocalciferol (DRISDOL) 1.25 MG (50000 UT) capsule Take 1 capsule (50,000 Units total) by mouth once a week. 04/05/22   Hoy Register, MD  fluticasone (FLONASE) 50 MCG/ACT nasal spray Place 2 sprays into both nostrils daily. 03/31/22   Hoy Register, MD  ibuprofen (ADVIL) 600 MG tablet Take 1 tablet (600 mg total) by mouth every 6 (six) hours as needed. 12/14/22   Becky Augusta, NP  Multiple Vitamin (MULTIVITAMIN PO) Take by mouth.    [provider]      ALLERGIES:  No Known Allergies   SOCIAL HISTORY:  Social History   Socioeconomic History   Marital status: Single    Spouse name: Not on file   Number of children: Not on file   Years of education: Not on file   Highest education level: Not on file  Occupational History   Not on file  Tobacco Use   Smoking status: Never   Smokeless tobacco: Never  Vaping Use   Vaping status: Never Used  Substance and Sexual Activity   Alcohol use: Not Currently   Drug use: No   Sexual activity: Yes    Partners: Male    Birth control/protection: None  Other Topics Concern   Not on file  Social History Narrative   Not on file   Social Determinants of Health   Financial Resource Strain: Not on file  Food Insecurity: No Food Insecurity (01/28/2022)   Hunger Vital Sign    Worried About Running Out of Food in the Last Year: Never true    Ran Out of Food in the Last Year: Never true  Transportation Needs: No Transportation Needs (01/28/2022)   PRAPARE - Administrator, Civil Service (Medical): No    Lack of Transportation (Non-Medical): No  Physical Activity: Not on file  Stress: Not on file  Social Connections: Not on file  Intimate Partner Violence: Not on file  FAMILY HISTORY:  Family History  Problem Relation Age of Onset   Diabetes Father      REVIEW OF SYSTEMS:  Constitutional: denies weight loss, fever, chills, or sweats  Eyes: denies any other vision changes, history of eye injury  ENT: denies sore throat, hearing problems  Respiratory: denies shortness of breath, wheezing  Cardiovascular: denies chest pain, palpitations  Gastrointestinal: positive abdominal pain, nausea and vomiting Genitourinary: denies burning with urination or urinary frequency Musculoskeletal: denies any other joint pains or cramps  Skin: denies any other rashes or skin discolorations  Neurological: denies any other headache, dizziness, weakness  Psychiatric: denies any other  depression, anxiety   All other review of systems were negative   VITAL SIGNS:  Temp:  [100.6 F (38.1 C)] 100.6 F (38.1 C) (10/09 0000) Pulse Rate:  [101-109] 101 (10/09 0140) Resp:  [22] 22 (10/09 0135) BP: (114-122)/(76-79) 122/76 (10/09 0135) SpO2:  [98 %-99 %] 99 % (10/09 0140) Weight:  [61.2 kg] 61.2 kg (10/09 0001)       Weight: 61.2 kg     INTAKE/OUTPUT:  This shift: No intake/output data recorded.  Last 2 shifts: @IOLAST2SHIFTS @   PHYSICAL EXAM:  Constitutional:  -- Normal body habitus  -- Awake, alert, and oriented x3  Eyes:  -- Pupils equally round and reactive to light  -- No scleral icterus  Ear, nose, and throat:  -- No jugular venous distension  Pulmonary:  -- No crackles  -- Equal breath sounds bilaterally -- Breathing non-labored at rest Cardiovascular:  -- S1, S2 present  -- No pericardial rubs Gastrointestinal:  -- Abdomen soft, tender, non-distended, no guarding or rebound tenderness -- No abdominal masses appreciated, pulsatile or otherwise  Musculoskeletal and Integumentary:  -- Wounds or skin discoloration: None appreciated -- Extremities: B/L UE and LE FROM, hands and feet warm, no edema  Neurologic:  -- Motor function: intact and symmetric -- Sensation: intact and symmetric   Labs:     Latest Ref Rng & Units 08/31/2023   12:05 AM 04/02/2022    9:12 AM 06/01/2020    2:30 PM  CBC  WBC 4.0 - 10.5 K/uL 11.2  7.0  5.8   Hemoglobin 12.0 - 15.0 g/dL 40.9  81.1  91.4   Hematocrit 36.0 - 46.0 % 37.3  38.4  41.2   Platelets 150 - 400 K/uL 184  203  224       Latest Ref Rng & Units 08/31/2023   12:05 AM 04/02/2022    9:12 AM 06/01/2020    2:30 PM  CMP  Glucose 70 - 99 mg/dL 782  95  956   BUN 6 - 20 mg/dL 12  12  14    Creatinine 0.44 - 1.00 mg/dL 2.13  0.86  5.78   Sodium 135 - 145 mmol/L 133  137  137   Potassium 3.5 - 5.1 mmol/L 3.5  4.5  4.2   Chloride 98 - 111 mmol/L 101  103  104   CO2 22 - 32 mmol/L 23  22  23    Calcium 8.9 - 10.3  mg/dL 8.2  9.0  9.1   Total Protein 6.5 - 8.1 g/dL 7.5  7.0  7.5   Total Bilirubin 0.3 - 1.2 mg/dL 0.8  <4.6  0.5   Alkaline Phos 38 - 126 U/L 87  92  62   AST 15 - 41 U/L 24  16  18    ALT 0 - 44 U/L 19  14  20  Imaging studies:  EXAM: CT ABDOMEN AND PELVIS WITH CONTRAST   TECHNIQUE: Multidetector CT imaging of the abdomen and pelvis was performed using the standard protocol following bolus administration of intravenous contrast.   RADIATION DOSE REDUCTION: This exam was performed according to the departmental dose-optimization program which includes automated exposure control, adjustment of the mA and/or kV according to patient size and/or use of iterative reconstruction technique.   CONTRAST:  OMNIPAQUE IOHEXOL 300 MG/ML  SOLN   COMPARISON:  None Available.   FINDINGS: Lower chest: No acute abnormality.   Hepatobiliary: No focal liver abnormality. No gallstones, gallbladder wall thickening, or pericholecystic fluid. No biliary dilatation.   Pancreas: No focal lesion. Normal pancreatic contour. No surrounding inflammatory changes. No main pancreatic ductal dilatation.   Spleen: Normal in size without focal abnormality.  Splenule noted.   Adrenals/Urinary Tract:   No adrenal nodule bilaterally.   Bilateral kidneys enhance symmetrically.   No hydronephrosis. No hydroureter.   The urinary bladder is unremarkable.   Stomach/Bowel: Stomach is within normal limits. No evidence of bowel wall thickening or dilatation. Colonic diverticulosis. The appendix measures at the upper limits of normal with trace periappendiceal fat stranding. No appendicolith.   Vascular/Lymphatic: No abdominal aorta or iliac aneurysm. No abdominal, pelvic, or inguinal lymphadenopathy.   Reproductive: Uterus and bilateral adnexa are unremarkable.   Other: No intraperitoneal free fluid. No intraperitoneal free gas. No organized fluid collection.   Musculoskeletal:   No abdominal  wall hernia or abnormality.   No suspicious lytic or blastic osseous lesions. No acute displaced fracture.   IMPRESSION: 1. Upper limits of normal caliber of the appendix with trace periappendiceal fat stranding. Finding could represent early/developing appendicitis. No appendicolith. 2. Colonic diverticulosis with no acute diverticulitis.     Electronically Signed   By: Tish Frederickson M.D.   On: 08/31/2023 02:22  Assessment/Plan:  32 y.o. female with acute appendicitises including acute appendicitis.  Patient with history, physical exam and images consistent with acute appendicitis. Patient oriented about diagnosis and surgical management as treatment. Patient oriented about goals of surgery and its risk including: bowel injury, infection, abscess, bleeding, leak from cecum, intestinal adhesions, bowel obstruction, fistula, injury to the ureter among others.  Patient understood and agreed to proceed with surgery. Will admit patient, already started on antibiotic therapy, will give IV hydration since patient is NPO and schedule to OR.   Gae Gallop, MD

## 2023-08-31 NOTE — Op Note (Signed)
Pre-op Diagnosis: Acute appendicitis   Post op Diagnosis: Acute appenditicis  Procedure: Robotic assisted laparoscopic appendectomy.  Anesthesia: GETA  Surgeon: Carolan Shiver, MD, FACS  Wound Classification: clean contaminated  Specimen: Appendix  Complications: None  Estimated Blood Loss: 3 mL   Indications: Patient is a 33 y.o. female  presented with abdominal pain, fever. CT scan shows acute appendicitis.     FIndings: 1.  Irritated appendix 2. No peri-appendiceal abscess or phlegmon 3. Normal anatomy 4. Adequate hemostasis.   Description of procedure: The patient was placed on the operating table in the supine position. General anesthesia was induced. A time-out was completed verifying correct patient, procedure, site, positioning, and implant(s) and/or special equipment prior to beginning this procedure. The abdomen was prepped and draped in the usual sterile fashion.   Palmer's point located and Veress needle was inserted.  After confirming 2 clicks and a positive saline drop test, gas insufflation was initiated until the abdominal pressure was measured at 15 mmHg.  Afterwards, the Veress needle was removed and a 8 mm port was placed in left upper quadrant area using Optiview technique.  After local was infused, 3 additional incision on the left abdominal wall were made 5 cm apart.  An 12 mm port and two other 8 mm ports were placed under direct visualization.  No injuries from trocar placements were noted.  The table was placed in the Trendelenburg position with the right side elevated.  With the use of Tip up grasper, fenestrated bipolar and monopolar scissors, an inflamed appendix was identified and elevated.  Window created at base of appendix in the mesentery.    The mesoappendix was divided with combination of bipolar energy and monopolar scissors.  The base of the appendix was ligated with 3-0 V-Loc.  The appendix was divided with monopolar scissors.  A second layer  of the 3 oh V-Loc was done over the appendiceal stump.   The appendiceal stump was examined and hemostasis noted. No other pathology was identified within pelvis. The 12 mm trocar removed and port site closed with PMI using 0 vicryl under direct vision. Remaining trocars were removed under direct vision. No bleeding was noted.The abdomen was allowed to collapse.  All skin incisions then closed with subcuticular sutures Monocryl 4-0.  Wounds then dressed with dermabond.  The patient tolerated the procedure well, awakened from anesthesia and was taken to the postanesthesia care unit in satisfactory condition.  Sponge count and instrument count correct at the end of the procedure.

## 2023-08-31 NOTE — ED Provider Notes (Signed)
[facility] Provider Note    Event Date/Time   First MD Initiated Contact with Patient 08/31/23 0122     (approximate)   History   Generalized Body Aches, Fever, and Abdominal Pain   HPI  Alexandra Hardy is a 32 y.o. female who presents to the ED for evaluation of Generalized Body Aches, Fever, and Abdominal Pain   Patient presents for evaluation of about 12 hours of fever and left lower quadrant abdominal pain.  Reports myalgias alongside the fever but has no other complaints.  Denies any emesis, stool changes, urinary changes, vaginal discharge or bleeding  Spanish interpreter utilized for history and physical   Physical Exam   Triage Vital Signs: ED Triage Vitals  Encounter Vitals Group     BP 08/31/23 0000 114/79     Systolic BP Percentile --      Diastolic BP Percentile --      Pulse Rate 08/31/23 0000 (!) 109     Resp 08/31/23 0000 (!) 22     Temp 08/31/23 0000 (!) 100.6 F (38.1 C)     Temp Source 08/31/23 0000 Oral     SpO2 08/31/23 0000 98 %     Weight 08/31/23 0001 134 lb 14.7 oz (61.2 kg)     Height --      Head Circumference --      Peak Flow --      Pain Score --      Pain Loc --      Pain Education --      Exclude from Growth Chart --     Most recent vital signs: Vitals:   08/31/23 0135 08/31/23 0140  BP: 122/76   Pulse: (!) 101 (!) 101  Resp: (!) 22   Temp:    SpO2: 98% 99%    General: Awake, no distress.  Seems uncomfortable, lying on her side CV:  Good peripheral perfusion.  Resp:  Normal effort.  Abd:  No distention.  Lower abdominal discomfort and tenderness throughout, benign upper abdomen. MSK:  No deformity noted.  Neuro:  No focal deficits appreciated. Other:     ED Results / Procedures / Treatments   Labs (all labs ordered are listed, but only abnormal results are displayed) Labs Reviewed  COMPREHENSIVE METABOLIC PANEL - Abnormal; Notable for the following components:      Result  Value   Sodium 133 (*)    Glucose, Bld 132 (*)    Calcium 8.2 (*)    All other components within normal limits  CBC WITH DIFFERENTIAL/PLATELET - Abnormal; Notable for the following components:   WBC 11.2 (*)    Neutro Abs 10.1 (*)    Lymphs Abs 0.6 (*)    All other components within normal limits  URINALYSIS, W/ REFLEX TO CULTURE (INFECTION SUSPECTED) - Abnormal; Notable for the following components:   Color, Urine YELLOW (*)    APPearance HAZY (*)    Leukocytes,Ua TRACE (*)    All other components within normal limits  SARS CORONAVIRUS 2 BY RT PCR  CULTURE, BLOOD (SINGLE)  LACTIC ACID, PLASMA  HIV ANTIBODY (ROUTINE TESTING W REFLEX)  POC URINE PREG, ED  TYPE AND SCREEN    EKG   RADIOLOGY CXR interpreted by me without evidence of acute cardiopulmonary pathology.  Official radiology report(s): CT HEAD WO CONTRAST ( )  Result Date: 08/31/2023 CLINICAL DATA:  fever, lower abd pain EXAM: CT HEAD WITHOUT CONTRAST TECHNIQUE: Contiguous axial images were obtained from the base  of the skull through the vertex without intravenous contrast. RADIATION DOSE REDUCTION: This exam was performed according to the departmental dose-optimization program which includes automated exposure control, adjustment of the mA and/or kV according to patient size and/or use of iterative reconstruction technique. COMPARISON:  None Available. FINDINGS: Brain: No evidence of large-territorial acute infarction. No parenchymal hemorrhage. No mass lesion. No extra-axial collection. No mass effect or midline shift. No hydrocephalus. Basilar cisterns are patent. Vascular: No hyperdense vessel. Skull: No acute fracture or focal lesion. Sinuses/Orbits: Paranasal sinuses and mastoid air cells are clear. The orbits are unremarkable. Other: None. IMPRESSION: No acute intracranial abnormality. Electronically Signed   By: Tish Frederickson M.D.   On: 08/31/2023 02:25   CT ABDOMEN PELVIS W CONTRAST  Result Date:  08/31/2023 CLINICAL DATA:  fever, lower abd pain EXAM: CT ABDOMEN AND PELVIS WITH CONTRAST TECHNIQUE: Multidetector CT imaging of the abdomen and pelvis was performed using the standard protocol following bolus administration of intravenous contrast. RADIATION DOSE REDUCTION: This exam was performed according to the departmental dose-optimization program which includes automated exposure control, adjustment of the mA and/or kV according to patient size and/or use of iterative reconstruction technique. CONTRAST:  OMNIPAQUE IOHEXOL 300 MG/ML  SOLN COMPARISON:  None Available. FINDINGS: Lower chest: No acute abnormality. Hepatobiliary: No focal liver abnormality. No gallstones, gallbladder wall thickening, or pericholecystic fluid. No biliary dilatation. Pancreas: No focal lesion. Normal pancreatic contour. No surrounding inflammatory changes. No main pancreatic ductal dilatation. Spleen: Normal in size without focal abnormality.  Splenule noted. Adrenals/Urinary Tract: No adrenal nodule bilaterally. Bilateral kidneys enhance symmetrically. No hydronephrosis. No hydroureter. The urinary bladder is unremarkable. Stomach/Bowel: Stomach is within normal limits. No evidence of bowel wall thickening or dilatation. Colonic diverticulosis. The appendix measures at the upper limits of normal with trace periappendiceal fat stranding. No appendicolith. Vascular/Lymphatic: No abdominal aorta or iliac aneurysm. No abdominal, pelvic, or inguinal lymphadenopathy. Reproductive: Uterus and bilateral adnexa are unremarkable. Other: No intraperitoneal free fluid. No intraperitoneal free gas. No organized fluid collection. Musculoskeletal: No abdominal wall hernia or abnormality. No suspicious lytic or blastic osseous lesions. No acute displaced fracture. IMPRESSION: 1. Upper limits of normal caliber of the appendix with trace periappendiceal fat stranding. Finding could represent early/developing appendicitis. No appendicolith. 2.  Colonic diverticulosis with no acute diverticulitis. Electronically Signed   By: Tish Frederickson M.D.   On: 08/31/2023 02:22   DG Chest 1 View  Result Date: 08/31/2023 CLINICAL DATA:  [id number] Fever and chills [id number] Fever [id number] generalized body aches and fever since this am. Pt is also reporting LLQ pain onset at 6pm tonight. Denies any other symptoms other than fever and "achy bones". Denies any sick contacts EXAM: CHEST  1 VIEW COMPARISON:  Chest x-ray 04/20/2017 FINDINGS: The heart and mediastinal contours are within normal limits. No focal consolidation. No pulmonary edema. No pleural effusion. No pneumothorax. No acute osseous abnormality. IMPRESSION: No active disease. Electronically Signed   By: Tish Frederickson M.D.   On: 08/31/2023 00:51    PROCEDURES and INTERVENTIONS:  .1-3 Lead EKG Interpretation  Performed by: Delton Prairie, MD Authorized by: Delton Prairie, MD     Interpretation: abnormal     ECG rate:  102   ECG rate assessment: tachycardic     Rhythm: sinus tachycardia     Ectopy: none     Conduction: normal   .Critical Care  Performed by: Delton Prairie, MD Authorized by: Delton Prairie, MD   Critical care provider statement:  Critical care time (minutes):  30   Critical care time was exclusive of:  Separately billable procedures and treating other patients   Critical care was necessary to treat or prevent imminent or life-threatening deterioration of the following conditions:  Sepsis   Critical care was time spent personally by me on the following activities:  Development of treatment plan with patient or surrogate, discussions with consultants, evaluation of patient's response to treatment, examination of patient, ordering and review of laboratory studies, ordering and review of radiographic studies, ordering and performing treatments and interventions, pulse oximetry, re-evaluation of patient's condition and review of old charts   Medications  fentaNYL (SUBLIMAZE)  injection 50 mcg (has no administration in time range)  piperacillin-tazobactam (ZOSYN) IVPB 3.375 g (has no administration in time range)  enoxaparin (LOVENOX) injection 40 mg (has no administration in time range)  0.9 %  sodium chloride infusion (has no administration in time range)  acetaminophen (TYLENOL) tablet 650 mg (has no administration in time range)    Or  acetaminophen (TYLENOL) suppository 650 mg (has no administration in time range)  HYDROcodone-acetaminophen (NORCO/VICODIN) 5-325 MG per tablet 1-2 tablet (has no administration in time range)  morphine (PF) 4 MG/ML injection 4 mg (has no administration in time range)  ondansetron (ZOFRAN-ODT) disintegrating tablet 4 mg (has no administration in time range)    Or  ondansetron (ZOFRAN) injection 4 mg (has no administration in time range)  ketorolac (TORADOL) 30 MG/ML injection 15 mg (15 mg Intravenous Given 08/31/23 0143)  lactated ringers bolus 1,000 mL (1,000 mLs Intravenous New Bag/Given 08/31/23 0146)  iohexol (OMNIPAQUE) 300 MG/ML solution 100 mL (100 mLs Intravenous Contrast Given 08/31/23 0201)     IMPRESSION / MDM / ASSESSMENT AND PLAN / ED COURSE  I reviewed the triage vital signs and the nursing notes.  Differential diagnosis includes, but is not limited to, appendicitis, diverticulitis, acute cystitis, PID, gastroenteritis, viral syndrome or COVID  {Patient presents with symptoms of an acute illness or injury that is potentially life-threatening.  Young woman presents with 12 hours of fever and lower abdominal pain with evidence of acute appendicitis requiring surgical admission.  Low-grade fever, tachycardic, but stable.  Localized tenderness without peritoneal features.  Minimal leukocytosis otherwise reassuring blood work.  Urine without infectious features, normal lactic acid and negative COVID swab.  CT with stigmata of acute appendicitis.  Provided antibiotics, I consult surgery who agrees to admit.  Clinical  Course as of 08/31/23 0306  Wed Aug 31, 2023  0203 Patient over in CT, I got a call from CT technicians saying that she is not reporting a headache.  We will add on a CT head while she is over there. [DS]  5621 I consult with Dr. Maia Plan, will see in the AM for possible appendectomy  [DS]    Clinical Course User Index [DS] Delton Prairie, MD     FINAL CLINICAL IMPRESSION(S) / ED DIAGNOSES   Final diagnoses:  Acute appendicitis with localized peritonitis, without perforation, abscess, or gangrene     Rx / DC Orders   ED Discharge Orders     None        Note:  This document was prepared using Dragon voice recognition software and may include unintentional dictation errors.   Delton Prairie, MD 08/31/23 [phone]

## 2023-08-31 NOTE — Anesthesia Preprocedure Evaluation (Signed)
Anesthesia Evaluation  Patient identified by MRN, date of birth, ID band Patient awake    Reviewed: Allergy & Precautions, NPO status , Patient's Chart, lab work & pertinent test results  History of Anesthesia Complications Negative for: history of anesthetic complications  Airway Mallampati: III  TM Distance: >3 FB Neck ROM: Full    Dental no notable dental hx. (+) Teeth Intact   Pulmonary neg pulmonary ROS, neg sleep apnea, neg COPD, Patient abstained from smoking.Not current smoker   Pulmonary exam normal breath sounds clear to auscultation       Cardiovascular Exercise Tolerance: Good METS(-) hypertension(-) CAD and (-) Past MI negative cardio ROS (-) dysrhythmias  Rhythm:Regular Rate:Normal - Systolic murmurs    Neuro/Psych negative neurological ROS  negative psych ROS   GI/Hepatic ,neg GERD  ,,(+)     (-) substance abuse  Acute appendicitis. + pain and nausea, but denies vomiting   Endo/Other  neg diabetes    Renal/GU negative Renal ROS     Musculoskeletal   Abdominal  (+)  Abdomen: tender.   Peds  Hematology   Anesthesia Other Findings Past Medical History: No date: Gestational diabetes No date: History of HPV infection  Reproductive/Obstetrics                             Anesthesia Physical Anesthesia Plan  ASA: 2  Anesthesia Plan: General   Post-op Pain Management: Ofirmev IV (intra-op)*   Induction: Intravenous and Rapid sequence  PONV Risk Score and Plan: 4 or greater and Ondansetron, Dexamethasone and Midazolam  Airway Management Planned: Oral ETT  Additional Equipment: None  Intra-op Plan:   Post-operative Plan: Extubation in OR  Informed Consent: I have reviewed the patients History and Physical, chart, labs and discussed the procedure including the risks, benefits and alternatives for the proposed anesthesia with the patient or authorized representative  who has indicated his/her understanding and acceptance.     Dental advisory given  Plan Discussed with: CRNA and Surgeon  Anesthesia Plan Comments: (Discussed risks of anesthesia with patient, including PONV, sore throat, lip/dental/eye damage. Rare risks discussed as well, such as cardiorespiratory and neurological sequelae, and allergic reactions. Discussed the role of CRNA in patient's perioperative care. Patient understands.)       Anesthesia Quick Evaluation

## 2023-09-01 LAB — SURGICAL PATHOLOGY

## 2023-09-01 MED ORDER — PIPERACILLIN-TAZOBACTAM 3.375 G IVPB
INTRAVENOUS | Status: AC
  Filled 2023-09-01: qty 50

## 2023-09-01 MED ORDER — METHOCARBAMOL 500 MG PO TABS
ORAL_TABLET | ORAL | Status: AC
  Filled 2023-09-01: qty 1

## 2023-09-01 MED ORDER — KETOROLAC TROMETHAMINE 30 MG/ML IJ SOLN
INTRAMUSCULAR | Status: AC
  Filled 2023-09-01: qty 1

## 2023-09-01 MED ORDER — HYDROCODONE-ACETAMINOPHEN 5-325 MG PO TABS
ORAL_TABLET | ORAL | Status: AC
  Filled 2023-09-01: qty 1

## 2023-09-01 MED ORDER — ENOXAPARIN SODIUM 40 MG/0.4ML IJ SOSY
PREFILLED_SYRINGE | INTRAMUSCULAR | Status: AC
  Filled 2023-09-01: qty 0.4

## 2023-09-01 MED ORDER — ORAL CARE MOUTH RINSE: 15.0000 mL | OROMUCOSAL | Status: DC | PRN

## 2023-09-01 NOTE — Progress Notes (Signed)
Patient ID: Alexandra Hardy, female   DOB: June 13, 1991, 32 y.o.   MRN: [id number]     SURGICAL PROGRESS NOTE   Hospital Day(s): 0.   Interval History: Patient seen and examined, no acute events or new complaints overnight. Patient reports slowly improving lower back pain.  Still with significant pain need of IV pain medications.  She endorses that she has not ambulated.   Vital signs in last 24 hours: [min-max] current  Temp:  [97.4 F (36.3 C)-98.1 F (36.7 C)] 97.4 F (36.3 C) (10/10 1159) Pulse Rate:  [63-72] 66 (10/10 1159) Resp:  [14-16] 16 (10/10 1159) BP: (95-103)/(59-75) 96/59 (10/10 1159) SpO2:  [96 %-99 %] 98 % (10/10 1159)       Weight: 61.2 kg     Physical Exam:  Constitutional: alert, cooperative and no distress  Respiratory: breathing non-labored at rest  Cardiovascular: regular rate and sinus rhythm  Gastrointestinal: soft, non-tender, and non-distended  Labs:     Latest Ref Rng & Units 08/31/2023   12:05 AM 04/02/2022    9:12 AM 06/01/2020    2:30 PM  CBC  WBC 4.0 - 10.5 K/uL 11.2  7.0  5.8   Hemoglobin 12.0 - 15.0 g/dL 28.4  13.2  44.0   Hematocrit 36.0 - 46.0 % 37.3  38.4  41.2   Platelets 150 - 400 K/uL 184  203  224       Latest Ref Rng & Units 08/31/2023   12:05 AM 04/02/2022    9:12 AM 06/01/2020    2:30 PM  CMP  Glucose 70 - 99 mg/dL 102  95  725   BUN 6 - 20 mg/dL 12  12  14    Creatinine 0.44 - 1.00 mg/dL 3.66  4.40  3.47   Sodium 135 - 145 mmol/L 133  137  137   Potassium 3.5 - 5.1 mmol/L 3.5  4.5  4.2   Chloride 98 - 111 mmol/L 101  103  104   CO2 22 - 32 mmol/L 23  22  23    Calcium 8.9 - 10.3 mg/dL 8.2  9.0  9.1   Total Protein 6.5 - 8.1 g/dL 7.5  7.0  7.5   Total Bilirubin 0.3 - 1.2 mg/dL 0.8  <4.2  0.5   Alkaline Phos 38 - 126 U/L 87  92  62   AST 15 - 41 U/L 24  16  18    ALT 0 - 44 U/L 19  14  20      Imaging studies: No new pertinent imaging studies   Assessment/Plan:  32 y.o. female with Ascites 1 Day Post-Op s/p  appendectomy, complicated by pertinent comorbidities including back pain.  -Patient recovering slowly -Still with significant back pain.  This has been slowly improving with NSAIDs and muscle relaxer -Advance diet to regular diet -Encouraged the patient to ambulate -Continue pain management, hopefully will be controlled enough to discharge home tomorrow.  Gae Gallop, MD

## 2023-09-01 NOTE — Plan of Care (Signed)
  Problem: Clinical Measurements: Goal: Respiratory complications will improve Outcome: Progressing   Problem: Elimination: Goal: Will not experience complications related to urinary retention Outcome: Progressing   Problem: Pain Managment: Goal: General experience of comfort will improve Outcome: Progressing

## 2023-09-01 NOTE — Plan of Care (Signed)
  Problem: Activity: Goal: Risk for activity intolerance will decrease Outcome: Progressing   Problem: Nutrition: Goal: Adequate nutrition will be maintained Outcome: Progressing   

## 2023-09-02 MED ORDER — METHOCARBAMOL 500 MG PO TABS: 500.0000 mg | ORAL_TABLET | Freq: Three times a day (TID) | ORAL | 0 refills | Status: AC

## 2023-09-02 MED ORDER — METHOCARBAMOL 500 MG PO TABS
ORAL_TABLET | ORAL | Status: AC
  Filled 2023-09-02: qty 1

## 2023-09-02 MED ORDER — MORPHINE SULFATE (PF) 4 MG/ML IV SOLN
INTRAVENOUS | Status: AC
  Filled 2023-09-02: qty 1

## 2023-09-02 MED ORDER — PIPERACILLIN-TAZOBACTAM 3.375 G IVPB
INTRAVENOUS | Status: AC
  Filled 2023-09-02: qty 50

## 2023-09-02 MED ORDER — KETOROLAC TROMETHAMINE 30 MG/ML IJ SOLN
INTRAMUSCULAR | Status: AC
  Filled 2023-09-02: qty 1

## 2023-09-02 MED ORDER — ENOXAPARIN SODIUM 40 MG/0.4ML IJ SOSY
PREFILLED_SYRINGE | INTRAMUSCULAR | Status: AC
  Filled 2023-09-02: qty 0.4

## 2023-09-02 MED ORDER — NABUMETONE 500 MG PO TABS: 500.0000 mg | ORAL_TABLET | Freq: Two times a day (BID) | ORAL | 0 refills | Status: AC

## 2023-09-02 NOTE — Discharge Instructions (Signed)

## 2023-09-02 NOTE — Discharge Summary (Signed)
Patient ID: Alexandra Hardy MRN: [id number] DOB/AGE: 03/23/1991 32 y.o.  Admit date: 08/31/2023 Discharge date: 09/02/2023   Discharge Diagnoses:  Principal Problem:   Acute appendicitis with localized peritonitis   Procedures: Robotic assisted laparoscopic appendectomy  Hospital Course: Patient admitted with diagnosis of acute appendicitis.  She underwent robotic appendectomy.  She has been recovering slowly.  Main issue postoperative was severe lower back pain.  She was treated with narcotics, muscle relaxers and NSAIDs.  Today the patient endorses that the back pain has significantly improved with minimal pain on the lower back.  There is no abdominal pain.  Patient tolerating solid diet.  Incisions are healing well.  Physical Exam Vitals reviewed.  HENT:     Head: Normocephalic.  Cardiovascular:     Rate and Rhythm: Normal rate and regular rhythm.  Pulmonary:     Effort: Pulmonary effort is normal.     Breath sounds: Normal breath sounds.  Abdominal:     General: Abdomen is flat. Bowel sounds are normal. There is no distension.     Palpations: Abdomen is soft.     Tenderness: There is no abdominal tenderness.  Skin:    General: Skin is warm.     Capillary Refill: Capillary refill takes less than 2 seconds.  Neurological:     Mental Status: She is alert and oriented to person, place, and time.      Consults: None  Disposition: Discharge disposition: 01-Home or Self Care       Discharge Instructions     Diet - low sodium heart healthy   Complete by: As directed    Increase activity slowly   Complete by: As directed       Allergies as of 09/02/2023   No Known Allergies      Medication List     TAKE these medications    cetirizine 10 MG tablet Commonly known as: ZYRTEC Take 10 mg by mouth daily.   ergocalciferol 1.25 MG (50000 UT) capsule Commonly known as: Drisdol Take 1 capsule (50,000 Units total) by mouth once a week.    fluticasone 50 MCG/ACT nasal spray Commonly known as: FLONASE Place 2 sprays into both nostrils daily.   ibuprofen 600 MG tablet Commonly known as: ADVIL Take 1 tablet (600 mg total) by mouth every 6 (six) hours as needed.   methocarbamol 500 MG tablet Commonly known as: ROBAXIN Take 1 tablet (500 mg total) by mouth 3 (three) times daily for 7 days.   MULTIVITAMIN PO Take by mouth.   nabumetone 500 MG tablet Commonly known as: RELAFEN Take 1 tablet (500 mg total) by mouth 2 (two) times daily for 10 days.        Follow-up Information     Carolan Shiver, MD Follow up in 2 week(s).   Specialty: General Surgery Why: follow up after appendectomy Contact information: [address] [place] [address] [phone]

## 2023-09-05 LAB — CULTURE, BLOOD (SINGLE)
Culture: NO GROWTH
Special Requests: ADEQUATE

## 2023-09-08 ENCOUNTER — Ambulatory Visit
Admission: EM | Admit: 2023-09-08 | Discharge: 2023-09-08 | Disposition: A | Discharge status: Home / Self Care | Payer: Self-pay | Attending: Physician Assistant | Admitting: Physician Assistant

## 2023-09-08 DIAGNOSIS — N760 Acute vaginitis: Secondary | ICD-10-CM | POA: Insufficient documentation

## 2023-09-08 DIAGNOSIS — B3731 Acute candidiasis of vulva and vagina: Secondary | ICD-10-CM | POA: Insufficient documentation

## 2023-09-08 DIAGNOSIS — B9689 Other specified bacterial agents as the cause of diseases classified elsewhere: Secondary | ICD-10-CM | POA: Insufficient documentation

## 2023-09-08 DIAGNOSIS — N898 Other specified noninflammatory disorders of vagina: Secondary | ICD-10-CM | POA: Insufficient documentation

## 2023-09-08 LAB — WET PREP, GENITAL
Sperm: NONE SEEN
Trich, Wet Prep: NONE SEEN
WBC, Wet Prep HPF POC: >=10 — AB (ref <10)

## 2023-09-08 MED ORDER — CLOTRIMAZOLE 1 % EX CREA: TOPICAL_CREAM | CUTANEOUS | 0 refills | Status: AC

## 2023-09-08 MED ORDER — FLUCONAZOLE 150 MG PO TABS: ORAL_TABLET | ORAL | 0 refills | Status: DC

## 2023-09-08 MED ORDER — METRONIDAZOLE 500 MG PO TABS: 500.0000 mg | ORAL_TABLET | Freq: Two times a day (BID) | ORAL | 0 refills | Status: AC

## 2023-09-08 NOTE — ED Triage Notes (Signed)
Patient here today with c/o excessive vaginal fluid discharge since yesterday. She states that the discharge has granules. She had an appendectomy on 08/31/2023.

## 2023-09-08 NOTE — Discharge Instructions (Signed)
-  Tienes vaginosis bacteriana. He mandado a Special educational needs teacher que se llama metronidazol. tmelo por va oral cada 12 horas x 1 semana. No beba alcohol con este medicamento. -T tambin tienes una candidiasis. Le he enviado una crema para usar [employer] al da x 1 semana y un medicamento llamado fluconazol que se toma 1 tableta hoy y repite otra tableta en 3 das.  -You have bacterial vaginosis. I have sent a medication called metronidazole to the pharmacy. take it by mouth every 12 hours x 1 week. Do not drink alcohol with this medicine. -You have a yeast infection too. I have sent a cream to use twice daily x 1 week and a medication called fluconazole that you take 1 tablet today and repeat another tablet in 3 days.

## 2023-09-08 NOTE — ED Provider Notes (Signed)
[facility]    CSN: [id number] Arrival date & time: 09/08/23  1503      History   Chief Complaint No chief complaint on file.   HPI Alexandra Hardy is a 32 y.o. female presenting for increased vaginal discharge since yesterday. Denies odor. Denies abdominal/pelvic pain, dysuria, urinary frequency or urgency. No concern for STIs. No OTC meds used. No history of vaginal infections.   Language interpreter service used.  HPI  Past Medical History:  Diagnosis Date   Gestational diabetes    History of HPV infection     Patient Active Problem List   Diagnosis Date Noted   Acute appendicitis with localized peritonitis 08/31/2023   Umbilical hernia without obstruction and without gangrene    Normal labor 04/12/2018   SVD (spontaneous vaginal delivery) 04/12/2018   History of gestational diabetes in prior pregnancy, currently pregnant 11/02/2017   Supervision of high risk pregnancy, antepartum, first trimester 10/05/2017   Pap smear of cervix shows high risk HPV present 05/14/2015   Language barrier affecting health care 03/12/2015    Past Surgical History:  Procedure Laterality Date   UMBILICAL HERNIA REPAIR N/A 05/27/2020   Procedure: HERNIA REPAIR UMBILICAL ADULT, Open with mesh;  Surgeon: Henrene Dodge, MD;  Location: [facility];  Service: General;  Laterality: N/A;   XI ROBOTIC LAPAROSCOPIC ASSISTED APPENDECTOMY N/A 08/31/2023   Procedure: XI ROBOTIC LAPAROSCOPIC ASSISTED APPENDECTOMY;  Surgeon: Carolan Shiver, MD;  Location: [facility];  Service: General;  Laterality: N/A;    OB History     Gravida  2   Para  2   Term  2   Preterm      AB      Living  2      SAB      IAB      Ectopic      Multiple      Live Births  2            Home Medications    Prior to Admission medications   Medication Sig Start Date End Date Taking? Authorizing Provider  clotrimazole (LOTRIMIN) 1 % cream Apply to affected area 2 times  daily 09/08/23  Yes Shirlee Latch, PA-C  fluconazole (DIFLUCAN) 150 MG tablet Take 1 tab po today and repeat in 72 hours 09/08/23  Yes Eusebio Friendly B, PA-C  metroNIDAZOLE (FLAGYL) 500 MG tablet Take 1 tablet (500 mg total) by mouth 2 (two) times daily for 7 days. 09/08/23 09/15/23 Yes Shirlee Latch, PA-C  cetirizine (ZYRTEC) 10 MG tablet Take 10 mg by mouth daily. Patient not taking: Reported on 09/08/2023    [provider]  ergocalciferol (DRISDOL) 1.25 MG (50000 UT) capsule Take 1 capsule (50,000 Units total) by mouth once a week. 04/05/22   Hoy Register, MD  fluticasone (FLONASE) 50 MCG/ACT nasal spray Place 2 sprays into both nostrils daily. Patient not taking: Reported on 09/08/2023 03/31/22   Hoy Register, MD  ibuprofen (ADVIL) 600 MG tablet Take 1 tablet (600 mg total) by mouth every 6 (six) hours as needed. 12/14/22   Becky Augusta, NP  methocarbamol (ROBAXIN) 500 MG tablet Take 1 tablet (500 mg total) by mouth 3 (three) times daily for 7 days. 09/02/23 09/09/23  Carolan Shiver, MD  Multiple Vitamin (MULTIVITAMIN PO) Take by mouth.    [provider]  nabumetone (RELAFEN) 500 MG tablet Take 1 tablet (500 mg total) by mouth 2 (two) times daily for 10 days. 09/02/23 09/12/23  Carolan Shiver,  MD    Family History Family History  Problem Relation Age of Onset   Diabetes Father     Social History Social History   Tobacco Use   Smoking status: Never   Smokeless tobacco: Never  Vaping Use   Vaping status: Never Used  Substance Use Topics   Alcohol use: Not Currently   Drug use: No     Allergies   Patient has no known allergies.   Review of Systems Review of Systems  Constitutional:  Negative for fatigue and fever.  Gastrointestinal:  Negative for abdominal pain.  Genitourinary:  Positive for vaginal discharge. Negative for dysuria, flank pain, frequency, hematuria, urgency, vaginal bleeding and vaginal pain.  Musculoskeletal:   Negative for back pain.  Skin:  Negative for rash.     Physical Exam Triage Vital Signs ED Triage Vitals  Encounter Vitals Group     BP 09/08/23 1517 94/64     Systolic BP Percentile --      Diastolic BP Percentile --      Pulse Rate 09/08/23 1517 73     Resp 09/08/23 1517 16     Temp 09/08/23 1517 97.9 F (36.6 C)     Temp Source 09/08/23 1517 Oral     SpO2 09/08/23 1517 100 %     Weight 09/08/23 1516 135 lb (61.2 kg)     Height 09/08/23 1516 5' 2.99" (1.6 m)     Head Circumference --      Peak Flow --      Pain Score 09/08/23 1516 0     Pain Loc --      Pain Education --      Exclude from Growth Chart --    No data found.  Updated Vital Signs BP 94/64 (BP Location: Left Arm)   Pulse 73   Temp 97.9 F (36.6 C) (Oral)   Resp 16   Ht 5' 2.99" (1.6 m)   Wt 135 lb (61.2 kg)   LMP 08/19/2023 (Exact Date)   SpO2 100%   BMI 23.92 kg/m     Physical Exam Vitals and nursing note reviewed.  Constitutional:      General: She is not in acute distress.    Appearance: Normal appearance. She is not ill-appearing or toxic-appearing.  HENT:     Head: Normocephalic and atraumatic.  Eyes:     General: No scleral icterus.       Right eye: No discharge.        Left eye: No discharge.     Conjunctiva/sclera: Conjunctivae normal.  Cardiovascular:     Rate and Rhythm: Normal rate and regular rhythm.     Heart sounds: Normal heart sounds.  Pulmonary:     Effort: Pulmonary effort is normal. No respiratory distress.     Breath sounds: Normal breath sounds.  Abdominal:     Palpations: Abdomen is soft.     Tenderness: There is no abdominal tenderness.  Musculoskeletal:     Cervical back: Neck supple.  Skin:    General: Skin is dry.  Neurological:     General: No focal deficit present.     Mental Status: She is alert. Mental status is at baseline.     Motor: No weakness.     Gait: Gait normal.  Psychiatric:        Mood and Affect: Mood normal.        Behavior: Behavior  normal.        Thought Content: Thought content normal.  UC Treatments / Results  Labs (all labs ordered are listed, but only abnormal results are displayed) Labs Reviewed  WET PREP, GENITAL - Abnormal; Notable for the following components:      Result Value   Yeast Wet Prep HPF POC PRESENT (*)    Clue Cells Wet Prep HPF POC PRESENT (*)    WBC, Wet Prep HPF POC >=10 (*)    All other components within normal limits    EKG   Radiology No results found.  Procedures Procedures (including critical care time)  Medications Ordered in UC Medications - No data to display  Initial Impression / Assessment and Plan / UC Course  I have reviewed the triage vital signs and the nursing notes.  Pertinent labs & imaging results that were available during my care of the patient were reviewed by me and considered in my medical decision making (see chart for details).   32 y/o female presents for abnormal vaginal discharge since yesterday. No concern for STIs reported.  Patient elected to forgo pelvic exam and obtain vaginal self swab.  Ordered wet prep.  Positive for clue cells and yeast.  Reviewed results with patient. Will treat with metronidazole and Diflucan.  Patient also asked for a topical cream so I sent clotrimazole cream.  Supportive care.  Reviewed return precautions.  Final Clinical Impressions(s) / UC Diagnoses   Final diagnoses:  Bacterial vaginosis  Yeast vaginitis     Discharge Instructions      -Tienes vaginosis bacteriana. He mandado a Special educational needs teacher que se llama metronidazol. tmelo por va oral cada 12 horas x 1 semana. No beba alcohol con este medicamento. -T tambin tienes una candidiasis. Le he enviado una crema para usar [employer] al da x 1 semana y un medicamento llamado fluconazol que se toma 1 tableta hoy y repite otra tableta en 3 das.  -You have bacterial vaginosis. I have sent a medication called metronidazole to the pharmacy. take  it by mouth every 12 hours x 1 week. Do not drink alcohol with this medicine. -You have a yeast infection too. I have sent a cream to use twice daily x 1 week and a medication called fluconazole that you take 1 tablet today and repeat another tablet in 3 days.     ED Prescriptions     Medication Sig Dispense Auth. Provider   metroNIDAZOLE (FLAGYL) 500 MG tablet Take 1 tablet (500 mg total) by mouth 2 (two) times daily for 7 days. 14 tablet Eusebio Friendly B, PA-C   fluconazole (DIFLUCAN) 150 MG tablet Take 1 tab po today and repeat in 72 hours 2 tablet Eusebio Friendly B, PA-C   clotrimazole (LOTRIMIN) 1 % cream Apply to affected area 2 times daily 30 g Shirlee Latch, PA-C      PDMP not reviewed this encounter.   Shirlee Latch, PA-C 09/08/23 1606

## 2023-09-10 ENCOUNTER — Ambulatory Visit
Admission: EM | Admit: 2023-09-10 | Discharge: 2023-09-10 | Disposition: A | Discharge status: Home / Self Care | Payer: Self-pay | Attending: Family Medicine | Admitting: Family Medicine

## 2023-09-10 DIAGNOSIS — R509 Fever, unspecified: Secondary | ICD-10-CM | POA: Insufficient documentation

## 2023-09-10 DIAGNOSIS — Z20822 Contact with and (suspected) exposure to covid-19: Secondary | ICD-10-CM | POA: Insufficient documentation

## 2023-09-10 DIAGNOSIS — J020 Streptococcal pharyngitis: Secondary | ICD-10-CM | POA: Insufficient documentation

## 2023-09-10 LAB — SARS CORONAVIRUS 2 BY RT PCR: SARS Coronavirus 2 by RT PCR: NEGATIVE

## 2023-09-10 LAB — GROUP A STREP BY PCR: Group A Strep by PCR: DETECTED — AB

## 2023-09-10 MED ORDER — AMOXICILLIN 500 MG PO CAPS: 1000.0000 mg | ORAL_CAPSULE | Freq: Every day | ORAL | 0 refills | Status: AC

## 2023-09-10 MED ORDER — FLUCONAZOLE 150 MG PO TABS: 150.0000 mg | ORAL_TABLET | ORAL | 0 refills | Status: AC | PRN

## 2023-09-10 NOTE — ED Triage Notes (Signed)
Patient presents with sore throat and fever x 1 day Patient took tylenol at 4 am.     Husband is in room to interpret

## 2023-09-10 NOTE — Discharge Instructions (Addendum)
Tu prueba de estreptococos es positiva.  Pasa por la farmacia a recoger tus antibiticos.  Tambin le recet Diflucan, que le ayudar si empieza a tener una candidiasis.  Haga un seguimiento con su proveedor de atencin primaria segn sea necesario.  Your strep test is positive.  Stop by the pharmacy to pick up your antibiotics.  I also prescribed Diflucan which will help if you start to have a yeast infection.  Follow up with your primary care provider as needed.

## 2023-09-10 NOTE — ED Provider Notes (Signed)
[facility]    CSN: [id number] Arrival date & time: 09/10/23  3244      History   Chief Complaint Chief Complaint  Patient presents with   Sore Throat   Fever    HPI Alexandra Hardy is a 32 y.o. female.   HPI  History obtained from spouse and the patient.  Her husband serves as her Spanish interpreter at the patient's request.  [name] presents for sore throat that started yesterday. She had a fever last night and took tylenol at 4 AM.  She had surgery on her belly last week.  Endorses some bodyaches.  Denies cough, runny nose, headache or vomiting.     Past Medical History:  Diagnosis Date   Gestational diabetes    History of HPV infection     Patient Active Problem List   Diagnosis Date Noted   Acute appendicitis with localized peritonitis 08/31/2023   Umbilical hernia without obstruction and without gangrene    Normal labor 04/12/2018   SVD (spontaneous vaginal delivery) 04/12/2018   History of gestational diabetes in prior pregnancy, currently pregnant 11/02/2017   Supervision of high risk pregnancy, antepartum, first trimester 10/05/2017   Pap smear of cervix shows high risk HPV present 05/14/2015   Language barrier affecting health care 03/12/2015    Past Surgical History:  Procedure Laterality Date   UMBILICAL HERNIA REPAIR N/A 05/27/2020   Procedure: HERNIA REPAIR UMBILICAL ADULT, Open with mesh;  Surgeon: Henrene Dodge, MD;  Location: [facility];  Service: General;  Laterality: N/A;   XI ROBOTIC LAPAROSCOPIC ASSISTED APPENDECTOMY N/A 08/31/2023   Procedure: XI ROBOTIC LAPAROSCOPIC ASSISTED APPENDECTOMY;  Surgeon: Carolan Shiver, MD;  Location: [facility];  Service: General;  Laterality: N/A;    OB History     Gravida  2   Para  2   Term  2   Preterm      AB      Living  2      SAB      IAB      Ectopic      Multiple      Live Births  2            Home Medications    Prior to Admission  medications   Medication Sig Start Date End Date Taking? Authorizing Provider  amoxicillin (AMOXIL) 500 MG capsule Take 2 capsules (1,000 mg total) by mouth daily for 10 days. 09/10/23 09/20/23 Yes Suheyb Raucci, DO  ergocalciferol (DRISDOL) 1.25 MG (50000 UT) capsule Take 1 capsule (50,000 Units total) by mouth once a week. 04/05/22  Yes Hoy Register, MD  metroNIDAZOLE (FLAGYL) 500 MG tablet Take 1 tablet (500 mg total) by mouth 2 (two) times daily for 7 days. 09/08/23 09/15/23 Yes Eusebio Friendly B, PA-C  nabumetone (RELAFEN) 500 MG tablet Take 1 tablet (500 mg total) by mouth 2 (two) times daily for 10 days. 09/02/23 09/12/23 Yes Carolan Shiver, MD  cetirizine (ZYRTEC) 10 MG tablet Take 10 mg by mouth daily. Patient not taking: Reported on 09/08/2023    [provider]  clotrimazole (LOTRIMIN) 1 % cream Apply to affected area 2 times daily 09/08/23   Eusebio Friendly B, PA-C  fluconazole (DIFLUCAN) 150 MG tablet Take 1 tablet (150 mg total) by mouth as needed. Take 1 tab po today and repeat in 72 hours 09/10/23   Evrett Hakim, DO  fluticasone (FLONASE) 50 MCG/ACT nasal spray Place 2 sprays into both nostrils daily. Patient not taking: Reported on 09/08/2023  03/31/22   Hoy Register, MD  ibuprofen (ADVIL) 600 MG tablet Take 1 tablet (600 mg total) by mouth every 6 (six) hours as needed. 12/14/22   Becky Augusta, NP  Multiple Vitamin (MULTIVITAMIN PO) Take by mouth.    [provider]    Family History Family History  Problem Relation Age of Onset   Diabetes Father     Social History Social History   Tobacco Use   Smoking status: Never   Smokeless tobacco: Never  Vaping Use   Vaping status: Never Used  Substance Use Topics   Alcohol use: Not Currently   Drug use: No     Allergies   Patient has no known allergies.   Review of Systems Review of Systems: negative unless otherwise stated in HPI.      Physical Exam Triage Vital Signs ED Triage  Vitals  Encounter Vitals Group     BP      Systolic BP Percentile      Diastolic BP Percentile      Pulse      Resp      Temp      Temp src      SpO2      Weight      Height      Head Circumference      Peak Flow      Pain Score      Pain Loc      Pain Education      Exclude from Growth Chart    No data found.  Updated Vital Signs BP 94/73 (BP Location: Left Arm)   Pulse 88   Temp 99.5 F (37.5 C) (Oral)   Resp 18   LMP 08/19/2023 (Exact Date)   SpO2 100%   Visual Acuity Right Eye Distance:   Left Eye Distance:   Bilateral Distance:    Right Eye Near:   Left Eye Near:    Bilateral Near:     Physical Exam GEN:     alert, non-toxic appearing female in no distress    HENT:  mucus membranes moist, oropharyngeal without lesions, moderate erythema, + tonsillar hypertrophy with white exudates, no nasal discharge EYES:   pupils equal and reactive, no scleral injection or discharge NECK:  normal ROM, +lymphadenopathy, no meningismus   RESP:  no increased work of breathing Skin:   warm and dry, no rash on visible skin    UC Treatments / Results  Labs (all labs ordered are listed, but only abnormal results are displayed) Labs Reviewed  GROUP A STREP BY PCR - Abnormal; Notable for the following components:      Result Value   Group A Strep by PCR DETECTED (*)    All other components within normal limits  SARS CORONAVIRUS 2 BY RT PCR    EKG   Radiology No results found.  Procedures Procedures (including critical care time)  Medications Ordered in UC Medications - No data to display  Initial Impression / Assessment and Plan / UC Course  I have reviewed the triage vital signs and the nursing notes.  Pertinent labs & imaging results that were available during my care of the patient were reviewed by me and considered in my medical decision making (see chart for details).       Pt is a 32 y.o. female who presents for 2 days of respiratory symptoms. [name]  has an elevated temperature here of 99.5 despite recent antipyretics. Satting well on room air. Overall pt  is non-toxic appearing, well hydrated, without respiratory distress.  She is concerned that she may have picked something up on her recent visit here to the urgent care couple days ago.  Strep and COVID testing obtained.  COVID  was negative however her strep PCR is positive. Discussed symptomatic treatment.  Treat with amoxicillin daily 1000 mg for 10 days..  Typical duration of symptoms discussed.  Diflucan sent to the pharmacy as she has history of antibiotic associated yeast infections.  Return and ED precautions given and voiced understanding. Discussed MDM, treatment plan and plan for follow-up with patient who agrees with plan.     Final Clinical Impressions(s) / UC Diagnoses   Final diagnoses:  Strep pharyngitis  Encounter for laboratory testing for COVID-19 virus  Fever, unspecified     Discharge Instructions      Tu prueba de estreptococos es positiva.  Pasa por la farmacia a recoger tus antibiticos.  Tambin le recet Diflucan, que le ayudar si empieza a tener una candidiasis.  Haga un seguimiento con su proveedor de atencin primaria segn sea necesario.  Your strep test is positive.  Stop by the pharmacy to pick up your antibiotics.  I also prescribed Diflucan which will help if you start to have a yeast infection.  Follow up with your primary care provider as needed.      ED Prescriptions     Medication Sig Dispense Auth. Provider   amoxicillin (AMOXIL) 500 MG capsule Take 2 capsules (1,000 mg total) by mouth daily for 10 days. 20 capsule Dashawn Golda, DO   fluconazole (DIFLUCAN) 150 MG tablet Take 1 tablet (150 mg total) by mouth as needed. Take 1 tab po today and repeat in 72 hours 1 tablet Inesha Sow, DO      PDMP not reviewed this encounter.   Katha Cabal, DO 09/10/23 1157

## 2023-11-09 ENCOUNTER — Encounter: Payer: Self-pay | Admitting: General Surgery

## 2024-02-24 ENCOUNTER — Ambulatory Visit
Admission: EM | Admit: 2024-02-24 | Discharge: 2024-02-24 | Disposition: A | Discharge status: Home / Self Care | Payer: Self-pay | Attending: Family Medicine | Admitting: Family Medicine

## 2024-02-24 ENCOUNTER — Ambulatory Visit (INDEPENDENT_AMBULATORY_CARE_PROVIDER_SITE_OTHER): Payer: Self-pay

## 2024-02-24 ENCOUNTER — Encounter: Payer: Self-pay | Admitting: Emergency Medicine

## 2024-02-24 DIAGNOSIS — M79605 Pain in left leg: Secondary | ICD-10-CM

## 2024-02-24 DIAGNOSIS — M79604 Pain in right leg: Secondary | ICD-10-CM

## 2024-02-24 DIAGNOSIS — M79671 Pain in right foot: Secondary | ICD-10-CM

## 2024-02-24 DIAGNOSIS — M79672 Pain in left foot: Secondary | ICD-10-CM

## 2024-02-24 MED ORDER — METHOCARBAMOL 500 MG PO TABS: 500.0000 mg | ORAL_TABLET | Freq: Two times a day (BID) | ORAL | 0 refills | Status: AC

## 2024-02-24 MED ORDER — PREDNISONE 10 MG (21) PO TBPK: ORAL_TABLET | Freq: Every day | ORAL | 0 refills | Status: AC

## 2024-02-24 MED ORDER — NAPROXEN 500 MG PO TABS: 500.0000 mg | ORAL_TABLET | Freq: Two times a day (BID) | ORAL | 0 refills | Status: AC

## 2024-02-24 NOTE — Discharge Instructions (Addendum)
 Tras revisar sus radiografas, no presentaba fracturas ni dislocaciones seas. El radilogo an no ha interpretado Scientist, forensic. Si presenta anomalas significativas o es urgente, nos pondremos en contacto con usted. Debera ver Chiropractor.  Si le recetaron medicamentos, pase por la farmacia a recogerlos. Para el dolor, tome Tylenol 1500 mg dos veces al da, un relajante muscular (Robaxin) dos veces al da y Naprosyn dos veces al da, segn sea necesario. Aplique un vendaje compresivo ECA. Descanse y eleve la zona afectada por Chief Technology Officer. Aplique compresas tibias de forma intermitente, segn sea necesario. A medida que el dolor disminuya, reanude sus actividades normales lentamente, segn lo tolere. Si los sntomas persisten, consulte con su mdico de cabecera o un ortopedista.  Est atento a cualquier empeoramiento de los sntomas, como aumento de la debilidad o prdida de sensibilidad, aumento del Engineer, mining o prdida de la funcin vesical o intestinal. Si se presenta alguno de estos sntomas, acuda a urgencias de inmediato.  Tras revisar sus radiografas, no presentaba fracturas ni dislocaciones seas. El radilogo an no ha interpretado Scientist, forensic. Si presenta anomalas significativas o es urgente, nos pondremos en contacto con usted. Debera ver Chiropractor.  Si le recetaron medicamentos, pase por la farmacia a recogerlos. Para el dolor, tome Tylenol 1500 mg dos veces al da, un relajante muscular (Robaxin) dos veces al da y Naprosyn dos veces al da, segn sea necesario. Aplique un vendaje compresivo ECA. Descanse y eleve la zona afectada por Chief Technology Officer. Aplique compresas tibias de forma intermitente, segn sea necesario. A medida que el dolor disminuya, reanude sus actividades normales lentamente, segn lo tolere. Si los sntomas persisten, consulte con su mdico de cabecera o un ortopedista.  Est atento a cualquier empeoramiento de los sntomas, como aumento de la  debilidad o prdida de sensibilidad, aumento del Engineer, mining o prdida de la funcin vesical o intestinal. Si se presenta alguno de estos sntomas, acuda a urgencias de inmediato.

## 2024-02-24 NOTE — ED Provider Notes (Signed)
 [facility]    CSN: [id number] Arrival date & time: 02/24/24  1117      History   Chief Complaint Chief Complaint  Patient presents with   Foot Pain    HPI  HPI Alexandra Hardy is a 33 y.o. female.   A Spanish interpreter was used for this encounter:  Name: Alexandra Hardy #[id number]   Alexandra Hardy presents for bilateral foot pain for the past 3 weeks. Started playing soccer and now is experiencing pain on the bottom of her feet. Walking and playing soccer and better at rest. Has some heel pain and ankle swelling.  No difference in pain when she first wakes up in the morning.  Taking pain medication which helps but the pain comes back.  Taking Tylenol twice a day but stopped taking it. Taking anti-inflammatory once a day.  She got new shoes with a thicker sole as she usually wears flat shoes but the thicker shoes didn't help either.      Past Medical History:  Diagnosis Date   Gestational diabetes    History of HPV infection     Patient Active Problem List   Diagnosis Date Noted   Acute appendicitis with localized peritonitis 08/31/2023   Umbilical hernia without obstruction and without gangrene    Normal labor 04/12/2018   SVD (spontaneous vaginal delivery) 04/12/2018   History of gestational diabetes in prior pregnancy, currently pregnant 11/02/2017   Supervision of high risk pregnancy, antepartum, first trimester 10/05/2017   Pap smear of cervix shows high risk HPV present 05/14/2015   Language barrier affecting health care 03/12/2015    Past Surgical History:  Procedure Laterality Date   APPENDECTOMY     HERNIA REPAIR     UMBILICAL HERNIA REPAIR N/A 05/27/2020   Procedure: HERNIA REPAIR UMBILICAL ADULT, Open with mesh;  Surgeon: Henrene Dodge, MD;  Location: [facility];  Service: General;  Laterality: N/A;   XI ROBOTIC LAPAROSCOPIC ASSISTED APPENDECTOMY N/A 08/31/2023   Procedure: XI ROBOTIC LAPAROSCOPIC ASSISTED APPENDECTOMY;  Surgeon: Carolan Shiver, MD;  Location: [facility];  Service: General;  Laterality: N/A;    OB History     Gravida  2   Para  2   Term  2   Preterm      AB      Living  2      SAB      IAB      Ectopic      Multiple      Live Births  2            Home Medications    Prior to Admission medications   Medication Sig Start Date End Date Taking? Authorizing Provider  methocarbamol (ROBAXIN) 500 MG tablet Take 1 tablet (500 mg total) by mouth 2 (two) times daily. 02/24/24  Yes Tramell Piechota, DO  naproxen (NAPROSYN) 500 MG tablet Take 1 tablet (500 mg total) by mouth 2 (two) times daily with a meal. 02/24/24  Yes Lainey Nelson, DO  predniSONE (STERAPRED UNI-PAK 21 TAB) 10 MG (21) TBPK tablet Take by mouth daily. Take 6 tabs by mouth daily for 1, then 5 tabs for 1 day, then 4 tabs for 1 day, then 3 tabs for 1 day, then 2 tabs for 1 day, then 1 tab for 1 day. 02/24/24  Yes Kelee Cunningham, DO  cetirizine (ZYRTEC) 10 MG tablet Take 10 mg by mouth daily. Patient not taking: Reported on 09/08/2023    [provider]  clotrimazole (  LOTRIMIN) 1 % cream Apply to affected area 2 times daily 09/08/23   Shirlee Latch, PA-C  ergocalciferol (DRISDOL) 1.25 MG (50000 UT) capsule Take 1 capsule (50,000 Units total) by mouth once a week. 04/05/22   Hoy Register, MD  fluconazole (DIFLUCAN) 150 MG tablet Take 1 tablet (150 mg total) by mouth as needed. Take 1 tab po today and repeat in 72 hours 09/10/23   Lamarco Gudiel, DO  fluticasone (FLONASE) 50 MCG/ACT nasal spray Place 2 sprays into both nostrils daily. Patient not taking: Reported on 09/08/2023 03/31/22   Hoy Register, MD  ibuprofen (ADVIL) 600 MG tablet Take 1 tablet (600 mg total) by mouth every 6 (six) hours as needed. 12/14/22   Becky Augusta, NP  Multiple Vitamin (MULTIVITAMIN PO) Take by mouth.    [provider]    Family History Family History  Problem Relation Age of Onset   Diabetes Father     Social  History Social History   Tobacco Use   Smoking status: Never   Smokeless tobacco: Never  Vaping Use   Vaping status: Never Used  Substance Use Topics   Alcohol use: Not Currently   Drug use: No     Allergies   Patient has no known allergies.   Review of Systems Review of Systems: :negative unless otherwise stated in HPI.      Physical Exam Triage Vital Signs ED Triage Vitals  Encounter Vitals Group     BP 02/24/24 1153 107/64     Systolic BP Percentile --      Diastolic BP Percentile --      Pulse Rate 02/24/24 1153 (!) 59     Resp 02/24/24 1153 14     Temp 02/24/24 1153 98.6 F (37 C)     Temp Source 02/24/24 1153 Oral     SpO2 02/24/24 1153 100 %     Weight 02/24/24 1151 134 lb 14.7 oz (61.2 kg)     Height 02/24/24 1151 5\' 2"  (1.575 m)     Head Circumference --      Peak Flow --      Pain Score 02/24/24 1151 8     Pain Loc --      Pain Education --      Exclude from Growth Chart --    No data found.  Updated Vital Signs BP 107/64 (BP Location: Left Arm)   Pulse (!) 59   Temp 98.6 F (37 C) (Oral)   Resp 14   Ht 5\' 2"  (1.575 m)   Wt 61.2 kg   LMP 02/17/2024 (Approximate)   SpO2 100%   BMI 24.68 kg/m   Visual Acuity Right Eye Distance:   Left Eye Distance:   Bilateral Distance:    Right Eye Near:   Left Eye Near:    Bilateral Near:     Physical Exam GEN: well appearing female in no acute distress  CVS: well perfused  RESP: speaking in full sentences without pause, no respiratory distress  MSK:   Ankle/Foot, Bilateral: TTP noted at the base of the fifth metatarsal on the left, bilateral 1st and 2nd metatarsal shaft. No visible erythema, swelling, ecchymosis, or bony deformity. No notable pes planus deformity. Transverse arch grossly intact;  No evidence of tibiotalar deviation; Range of motion is full in all directions. Strength is 5/5 in all directions. Mild tenderness at the insertion/body/myotendinous junction of the Achilles tendon; No  tenderness on posterior aspects of lateral and medial malleolus; Unremarkable  squeeze;  Talar dome non tender; Unremarkable  calcaneal squeeze; No  plantar calcaneal tenderness; No  tenderness over the navicular prominence or  over cuboid; Able to walk 4 steps.     UC Treatments / Results  Labs (all labs ordered are listed, but only abnormal results are displayed) Labs Reviewed - No data to display  EKG   Radiology DG Foot Complete Right Result Date: 02/24/2024 CLINICAL DATA:  Bilateral foot pain. EXAM: RIGHT FOOT COMPLETE - 3+ VIEW; LEFT FOOT - COMPLETE 3+ VIEW COMPARISON:  None Available. FINDINGS: There is no evidence of fracture or dislocation. There is no evidence of arthropathy or other focal bone abnormality. Soft tissues are unremarkable. IMPRESSION: Negative. Electronically Signed   By: Elgie Collard M.D.   On: 02/24/2024 14:06   DG Foot Complete Left Result Date: 02/24/2024 CLINICAL DATA:  Bilateral foot pain. EXAM: RIGHT FOOT COMPLETE - 3+ VIEW; LEFT FOOT - COMPLETE 3+ VIEW COMPARISON:  None Available. FINDINGS: There is no evidence of fracture or dislocation. There is no evidence of arthropathy or other focal bone abnormality. Soft tissues are unremarkable. IMPRESSION: Negative. Electronically Signed   By: Elgie Collard M.D.   On: 02/24/2024 14:06     Procedures Procedures (including critical care time)  Medications Ordered in UC Medications - No data to display  Initial Impression / Assessment and Plan / UC Course  I have reviewed the triage vital signs and the nursing notes.  Pertinent labs & imaging results that were available during my care of the patient were reviewed by me and considered in my medical decision making (see chart for details).      Pt is a 33 y.o.  female with 3 weeks of bilateral foot pain after starting to play soccer.  On exam, pt has tenderness at the base of the fifth metatarsal on the left with bilateral 1st and 2nd metatarsal shaft.  concerning for bony injury.   Obtained left and right foot plain films.  Personally interpreted by me were unremarkable for fracture or dislocation. Radiologist report reviewed.  She may have an element of Achilles tendinitis as she does have some tenderness here.  Recommended heel support pads.  Will give her rest from soccer to allow her feet to adjust.  Patient to gradually return to normal activities, as tolerated and continue ordinary activities within the limits permitted by pain. Prescribed prednisone taper for inflammation, naproxen sodium  and muscle relaxer  for pain relief.  Tylenol PRN. Advised patient to avoid OTC NSAIDs while taking prescription NSAID.   Patient to follow up with orthopedic provider, if symptoms do not improve with conservative treatment.  Return and ED precautions given. Understanding voiced. Discussed MDM, treatment plan and plan for follow-up with patient who agrees with plan.   Final Clinical Impressions(s) / UC Diagnoses   Final diagnoses:  Bilateral leg and foot pain     Discharge Instructions      Tras revisar sus radiografas, no presentaba fracturas ni dislocaciones seas. El radilogo an no ha interpretado Scientist, forensic. Si presenta anomalas significativas o es urgente, nos pondremos en contacto con usted. Debera ver Chiropractor.  Si le recetaron medicamentos, pase por la farmacia a recogerlos. Para el dolor, tome Tylenol 1500 mg dos veces al da, un relajante muscular (Robaxin) dos veces al da y Naprosyn dos veces al da, segn sea necesario. Aplique un vendaje compresivo ECA. Descanse y eleve la zona afectada por Chief Technology Officer. Aplique compresas tibias de forma intermitente, segn sea  necesario. A medida que el dolor disminuya, reanude sus actividades normales lentamente, segn lo tolere. Si los sntomas persisten, consulte con su mdico de cabecera o un ortopedista.  Est atento a cualquier empeoramiento de los sntomas, como aumento de la  debilidad o prdida de sensibilidad, aumento del Engineer, mining o prdida de la funcin vesical o intestinal. Si se presenta alguno de estos sntomas, acuda a urgencias de inmediato.  Tras revisar sus radiografas, no presentaba fracturas ni dislocaciones seas. El radilogo an no ha interpretado Scientist, forensic. Si presenta anomalas significativas o es urgente, nos pondremos en contacto con usted. Debera ver Chiropractor.  Si le recetaron medicamentos, pase por la farmacia a recogerlos. Para el dolor, tome Tylenol 1500 mg dos veces al da, un relajante muscular (Robaxin) dos veces al da y Naprosyn dos veces al da, segn sea necesario. Aplique un vendaje compresivo ECA. Descanse y eleve la zona afectada por Chief Technology Officer. Aplique compresas tibias de forma intermitente, segn sea necesario. A medida que el dolor disminuya, reanude sus actividades normales lentamente, segn lo tolere. Si los sntomas persisten, consulte con su mdico de cabecera o un ortopedista.  Est atento a cualquier empeoramiento de los sntomas, como aumento de la debilidad o prdida de sensibilidad, aumento del Engineer, mining o prdida de la funcin vesical o intestinal. Si se presenta alguno de estos sntomas, acuda a urgencias de inmediato.     ED Prescriptions     Medication Sig Dispense Auth. Provider   naproxen (NAPROSYN) 500 MG tablet Take 1 tablet (500 mg total) by mouth 2 (two) times daily with a meal. 30 tablet Shaune Malacara, DO   methocarbamol (ROBAXIN) 500 MG tablet Take 1 tablet (500 mg total) by mouth 2 (two) times daily. 20 tablet Starr Engel, DO   predniSONE (STERAPRED UNI-PAK 21 TAB) 10 MG (21) TBPK tablet Take by mouth daily. Take 6 tabs by mouth daily for 1, then 5 tabs for 1 day, then 4 tabs for 1 day, then 3 tabs for 1 day, then 2 tabs for 1 day, then 1 tab for 1 day. 21 tablet Katha Cabal, DO      PDMP not reviewed this encounter.   Katha Cabal, DO 02/25/24 1256

## 2024-02-24 NOTE — ED Triage Notes (Signed)
 [employer] [phone] used during triage.  Patient c/o pain in her bilateral feet for the past 3 weeks.  Patient reports pain in her heels and arch of both feet.

## 2024-03-26 ENCOUNTER — Encounter: Payer: Self-pay | Admitting: Emergency Medicine

## 2024-03-26 ENCOUNTER — Ambulatory Visit
Admission: EM | Admit: 2024-03-26 | Discharge: 2024-03-26 | Disposition: A | Discharge status: Home / Self Care | Payer: Self-pay | Attending: Physician Assistant | Admitting: Physician Assistant

## 2024-03-26 DIAGNOSIS — J069 Acute upper respiratory infection, unspecified: Secondary | ICD-10-CM

## 2024-03-26 DIAGNOSIS — R051 Acute cough: Secondary | ICD-10-CM

## 2024-03-26 DIAGNOSIS — R0981 Nasal congestion: Secondary | ICD-10-CM

## 2024-03-26 MED ORDER — BENZONATATE 200 MG PO CAPS: 200.0000 mg | ORAL_CAPSULE | Freq: Three times a day (TID) | ORAL | 0 refills | Status: AC | PRN

## 2024-03-26 MED ORDER — PROMETHAZINE-DM 6.25-15 MG/5ML PO SYRP: 5.0000 mL | ORAL_SOLUTION | Freq: Four times a day (QID) | ORAL | 0 refills | Status: AC | PRN

## 2024-03-26 MED ORDER — IPRATROPIUM BROMIDE 0.06 % NA SOLN: 2.0000 | Freq: Four times a day (QID) | NASAL | 0 refills | Status: AC

## 2024-03-26 NOTE — Discharge Instructions (Signed)

## 2024-03-26 NOTE — ED Triage Notes (Addendum)
 Pt presents with a cough and nasal congestion x 4-5 days.

## 2024-03-26 NOTE — ED Provider Notes (Signed)
 [facility]    CSN: [id number] Arrival date & time: 03/26/24  0811      History   Chief Complaint Chief Complaint  Patient presents with   Cough   Nasal Congestion    HPI Alexandra Hardy is a 33 y.o. female presenting for 4 to 5-day history of nasal congestion, dry cough, runny nose.  Denies fever, sinus pain, sore throat, chest pain, wheezing, shortness of breath.  Unsure of any sick contacts.  Has been taking allergy medicine without relief.  HPI  Past Medical History:  Diagnosis Date   Gestational diabetes    History of HPV infection     Patient Active Problem List   Diagnosis Date Noted   Acute appendicitis with localized peritonitis 08/31/2023   Umbilical hernia without obstruction and without gangrene    Normal labor 04/12/2018   SVD (spontaneous vaginal delivery) 04/12/2018   History of gestational diabetes in prior pregnancy, currently pregnant 11/02/2017   Supervision of high risk pregnancy, antepartum, first trimester 10/05/2017   Pap smear of cervix shows high risk HPV present 05/14/2015   Language barrier affecting health care 03/12/2015    Past Surgical History:  Procedure Laterality Date   APPENDECTOMY     HERNIA REPAIR     UMBILICAL HERNIA REPAIR N/A 05/27/2020   Procedure: HERNIA REPAIR UMBILICAL ADULT, Open with mesh;  Surgeon: Emmalene Hare, MD;  Location: [facility];  Service: General;  Laterality: N/A;   XI ROBOTIC LAPAROSCOPIC ASSISTED APPENDECTOMY N/A 08/31/2023   Procedure: XI ROBOTIC LAPAROSCOPIC ASSISTED APPENDECTOMY;  Surgeon: Eldred Grego, MD;  Location: [facility];  Service: General;  Laterality: N/A;    OB History     Gravida  2   Para  2   Term  2   Preterm      AB      Living  2      SAB      IAB      Ectopic      Multiple      Live Births  2            Home Medications    Prior to Admission medications   Medication Sig Start Date End Date Taking? Authorizing Provider   benzonatate  (TESSALON ) 200 MG capsule Take 1 capsule (200 mg total) by mouth 3 (three) times daily as needed for cough. 03/26/24  Yes Nancy Axon B, PA-C  ipratropium (ATROVENT) 0.06 % nasal spray Place 2 sprays into both nostrils 4 (four) times daily. 03/26/24  Yes Nancy Axon B, PA-C  promethazine -dextromethorphan (PROMETHAZINE -DM) 6.25-15 MG/5ML syrup Take 5 mLs by mouth 4 (four) times daily as needed. 03/26/24  Yes Floydene Hy, PA-C  cetirizine  (ZYRTEC ) 10 MG tablet Take 10 mg by mouth daily. Patient not taking: Reported on 09/08/2023    [provider]  clotrimazole  (LOTRIMIN ) 1 % cream Apply to affected area 2 times daily 09/08/23   Nancy Axon B, PA-C  ergocalciferol  (DRISDOL ) 1.25 MG (50000 UT) capsule Take 1 capsule (50,000 Units total) by mouth once a week. 04/05/22   Newlin, Enobong, MD  fluconazole  (DIFLUCAN ) 150 MG tablet Take 1 tablet (150 mg total) by mouth as needed. Take 1 tab po today and repeat in 72 hours 09/10/23   Brimage, Vondra, DO  fluticasone  (FLONASE ) 50 MCG/ACT nasal spray Place 2 sprays into both nostrils daily. Patient not taking: Reported on 09/08/2023 03/31/22   Newlin, Enobong, MD  ibuprofen  (ADVIL ) 600 MG tablet Take 1 tablet (600 mg  total) by mouth every 6 (six) hours as needed. 12/14/22   Kent Pear, NP  methocarbamol  (ROBAXIN ) 500 MG tablet Take 1 tablet (500 mg total) by mouth 2 (two) times daily. 02/24/24   Brimage, Vondra, DO  Multiple Vitamin (MULTIVITAMIN PO) Take by mouth.    [provider]  naproxen  (NAPROSYN ) 500 MG tablet Take 1 tablet (500 mg total) by mouth 2 (two) times daily with a meal. 02/24/24   Brimage, Vondra, DO  predniSONE  (STERAPRED UNI-PAK 21 TAB) 10 MG (21) TBPK tablet Take by mouth daily. Take 6 tabs by mouth daily for 1, then 5 tabs for 1 day, then 4 tabs for 1 day, then 3 tabs for 1 day, then 2 tabs for 1 day, then 1 tab for 1 day. 02/24/24   Brimage, Vondra, DO    Family History Family History  Problem Relation Age  of Onset   Diabetes Father     Social History Social History   Tobacco Use   Smoking status: Never   Smokeless tobacco: Never  Vaping Use   Vaping status: Never Used  Substance Use Topics   Alcohol use: Not Currently   Drug use: No     Allergies   Patient has no known allergies.   Review of Systems Review of Systems  Constitutional:  Negative for chills, diaphoresis, fatigue and fever.  HENT:  Positive for congestion, rhinorrhea and sneezing. Negative for ear pain, sinus pressure, sinus pain and sore throat.   Respiratory:  Positive for cough. Negative for shortness of breath.   Cardiovascular:  Negative for chest pain.  Gastrointestinal:  Negative for abdominal pain, nausea and vomiting.  Musculoskeletal:  Negative for arthralgias and myalgias.  Skin:  Negative for rash.  Neurological:  Negative for weakness and headaches.  Hematological:  Negative for adenopathy.     Physical Exam Triage Vital Signs ED Triage Vitals  Encounter Vitals Group     BP      Systolic BP Percentile      Diastolic BP Percentile      Pulse      Resp      Temp      Temp src      SpO2      Weight      Height      Head Circumference      Peak Flow      Pain Score      Pain Loc      Pain Education      Exclude from Growth Chart    No data found.  Updated Vital Signs BP 104/70 (BP Location: Right Arm)   Pulse 68   Temp 98.3 F (36.8 C) (Oral)   Resp 16   LMP 03/05/2024   SpO2 94%    Physical Exam Vitals and nursing note reviewed.  Constitutional:      General: She is not in acute distress.    Appearance: Normal appearance. She is not ill-appearing or toxic-appearing.  HENT:     Head: Normocephalic and atraumatic.     Right Ear: Tympanic membrane, ear canal and external ear normal.     Left Ear: Tympanic membrane, ear canal and external ear normal.     Nose: Congestion present.     Mouth/Throat:     Mouth: Mucous membranes are moist.     Pharynx: Oropharynx is clear.   Eyes:     General: No scleral icterus.       Right eye: No discharge.  Left eye: No discharge.     Conjunctiva/sclera: Conjunctivae normal.  Cardiovascular:     Rate and Rhythm: Normal rate and regular rhythm.     Heart sounds: Normal heart sounds.  Pulmonary:     Effort: Pulmonary effort is normal. No respiratory distress.     Breath sounds: Normal breath sounds.  Musculoskeletal:     Cervical back: Neck supple.  Skin:    General: Skin is dry.  Neurological:     General: No focal deficit present.     Mental Status: She is alert. Mental status is at baseline.     Motor: No weakness.     Gait: Gait normal.  Psychiatric:        Mood and Affect: Mood normal.        Behavior: Behavior normal.      UC Treatments / Results  Labs (all labs ordered are listed, but only abnormal results are displayed) Labs Reviewed - No data to display  EKG   Radiology No results found.  Procedures Procedures (including critical care time)  Medications Ordered in UC Medications - No data to display  Initial Impression / Assessment and Plan / UC Course  I have reviewed the triage vital signs and the nursing notes.  Pertinent labs & imaging results that were available during my care of the patient were reviewed by me and considered in my medical decision making (see chart for details).    33 year old female presents for 4-day history of cough, congestion and runny nose.  Denies fever, sore throat or breathing difficulty.  Presentation suspect viral URI.  Supportive care encouraged with increased rest and fluids.  Telemedicine DM, Atrovent nasal saline benzonatate  reviewed return precautions after discussing typical course of most viral illnesses.   Final Clinical Impressions(s) / UC Diagnoses   Final diagnoses:  Viral upper respiratory tract infection  Nasal congestion  Acute cough     Discharge Instructions      URI/COLD SYMPTOMS: Your exam today is consistent with a  viral illness. Antibiotics are not indicated at this time. Use medications as directed, including cough syrup, nasal saline, and decongestants. Your symptoms should improve over the next few days and resolve within 7-10 days. Increase rest and fluids. F/u if symptoms worsen or predominate such as sore throat, ear pain, productive cough, shortness of breath, or if you develop high fevers or worsening fatigue over the next several days.       ED Prescriptions     Medication Sig Dispense Auth. Provider   promethazine -dextromethorphan (PROMETHAZINE -DM) 6.25-15 MG/5ML syrup Take 5 mLs by mouth 4 (four) times daily as needed. 118 mL Nancy Axon B, PA-C   ipratropium (ATROVENT) 0.06 % nasal spray Place 2 sprays into both nostrils 4 (four) times daily. 15 mL Nancy Axon B, PA-C   benzonatate  (TESSALON ) 200 MG capsule Take 1 capsule (200 mg total) by mouth 3 (three) times daily as needed for cough. 30 capsule Floydene Hy, PA-C      PDMP not reviewed this encounter.   Floydene Hy, PA-C 03/26/24 [phone]

## 2024-11-01 ENCOUNTER — Telehealth: Payer: Self-pay | Admitting: Family Medicine

## 2025-01-24 ENCOUNTER — Ambulatory Visit: Payer: Self-pay
